# Patient Record
Sex: Female | Born: 1960 | Race: White | Hispanic: No | Marital: Single | Smoking: Current some day smoker
Health system: Southern US, Community
[De-identification: ages and names within clinical notes are randomized; demographics above are authoritative.]

## PROBLEM LIST (undated history)

## (undated) DIAGNOSIS — F32A Depression, unspecified: Secondary | ICD-10-CM

## (undated) DIAGNOSIS — F419 Anxiety disorder, unspecified: Secondary | ICD-10-CM

## (undated) DIAGNOSIS — I10 Essential (primary) hypertension: Secondary | ICD-10-CM

## (undated) DIAGNOSIS — F329 Major depressive disorder, single episode, unspecified: Secondary | ICD-10-CM

## (undated) DIAGNOSIS — J449 Chronic obstructive pulmonary disease, unspecified: Secondary | ICD-10-CM

## (undated) HISTORY — PX: APPENDECTOMY: SHX54

## (undated) HISTORY — PX: KNEE CARTILAGE SURGERY: SHX688

---

## 1998-04-08 ENCOUNTER — Emergency Department (HOSPITAL_COMMUNITY): Admission: EM | Admit: 1998-04-08 | Discharge: 1998-04-08 | Payer: Self-pay | Admitting: Emergency Medicine

## 1998-06-13 ENCOUNTER — Ambulatory Visit (HOSPITAL_COMMUNITY): Admission: RE | Admit: 1998-06-13 | Discharge: 1998-06-13 | Payer: Self-pay | Admitting: Orthopedic Surgery

## 1998-07-16 ENCOUNTER — Ambulatory Visit (HOSPITAL_COMMUNITY): Admission: RE | Admit: 1998-07-16 | Discharge: 1998-07-17 | Payer: Self-pay | Admitting: Orthopedic Surgery

## 1998-07-30 ENCOUNTER — Ambulatory Visit (HOSPITAL_COMMUNITY): Admission: RE | Admit: 1998-07-30 | Discharge: 1998-07-30 | Payer: Self-pay | Admitting: Orthopedic Surgery

## 1998-09-07 ENCOUNTER — Ambulatory Visit (HOSPITAL_COMMUNITY): Admission: RE | Admit: 1998-09-07 | Discharge: 1998-09-07 | Payer: Self-pay | Admitting: Orthopedic Surgery

## 1998-09-07 ENCOUNTER — Encounter: Payer: Self-pay | Admitting: Orthopedic Surgery

## 2000-01-27 ENCOUNTER — Other Ambulatory Visit: Admission: RE | Admit: 2000-01-27 | Discharge: 2000-01-27 | Payer: Self-pay | Admitting: Obstetrics and Gynecology

## 2007-05-08 ENCOUNTER — Emergency Department (HOSPITAL_COMMUNITY): Admission: EM | Admit: 2007-05-08 | Discharge: 2007-05-08 | Payer: Self-pay | Admitting: Emergency Medicine

## 2007-07-20 ENCOUNTER — Emergency Department (HOSPITAL_COMMUNITY): Admission: EM | Admit: 2007-07-20 | Discharge: 2007-07-21 | Payer: Self-pay | Admitting: Emergency Medicine

## 2007-08-07 ENCOUNTER — Emergency Department (HOSPITAL_COMMUNITY): Admission: EM | Admit: 2007-08-07 | Discharge: 2007-08-07 | Payer: Self-pay | Admitting: Emergency Medicine

## 2007-09-01 ENCOUNTER — Emergency Department (HOSPITAL_COMMUNITY): Admission: EM | Admit: 2007-09-01 | Discharge: 2007-09-01 | Payer: Self-pay | Admitting: Family Medicine

## 2007-09-30 ENCOUNTER — Emergency Department (HOSPITAL_COMMUNITY): Admission: EM | Admit: 2007-09-30 | Discharge: 2007-09-30 | Payer: Self-pay | Admitting: Emergency Medicine

## 2007-11-04 ENCOUNTER — Emergency Department (HOSPITAL_COMMUNITY): Admission: EM | Admit: 2007-11-04 | Discharge: 2007-11-05 | Payer: Self-pay | Admitting: Emergency Medicine

## 2008-08-12 ENCOUNTER — Emergency Department (HOSPITAL_COMMUNITY): Admission: EM | Admit: 2008-08-12 | Discharge: 2008-08-12 | Payer: Self-pay | Admitting: Emergency Medicine

## 2008-12-13 ENCOUNTER — Emergency Department (HOSPITAL_COMMUNITY): Admission: EM | Admit: 2008-12-13 | Discharge: 2008-12-13 | Payer: Self-pay | Admitting: Family Medicine

## 2009-02-07 ENCOUNTER — Emergency Department (HOSPITAL_COMMUNITY): Admission: EM | Admit: 2009-02-07 | Discharge: 2009-02-07 | Payer: Self-pay | Admitting: Emergency Medicine

## 2009-05-26 ENCOUNTER — Emergency Department (HOSPITAL_COMMUNITY): Admission: EM | Admit: 2009-05-26 | Discharge: 2009-05-26 | Payer: Self-pay | Admitting: Emergency Medicine

## 2009-06-30 ENCOUNTER — Emergency Department (HOSPITAL_COMMUNITY): Admission: EM | Admit: 2009-06-30 | Discharge: 2009-07-01 | Payer: Self-pay | Admitting: Emergency Medicine

## 2009-07-26 ENCOUNTER — Encounter: Admission: RE | Admit: 2009-07-26 | Discharge: 2009-07-26 | Payer: Self-pay | Admitting: Internal Medicine

## 2009-07-31 IMAGING — CR DG CHEST 2V
2 series · 2 of 2 positions shown · non-contrast
Comparison: none

CLINICAL DATA: Difficulty breathing.    
 CHEST - 2 VIEW:

[w chest pa *]
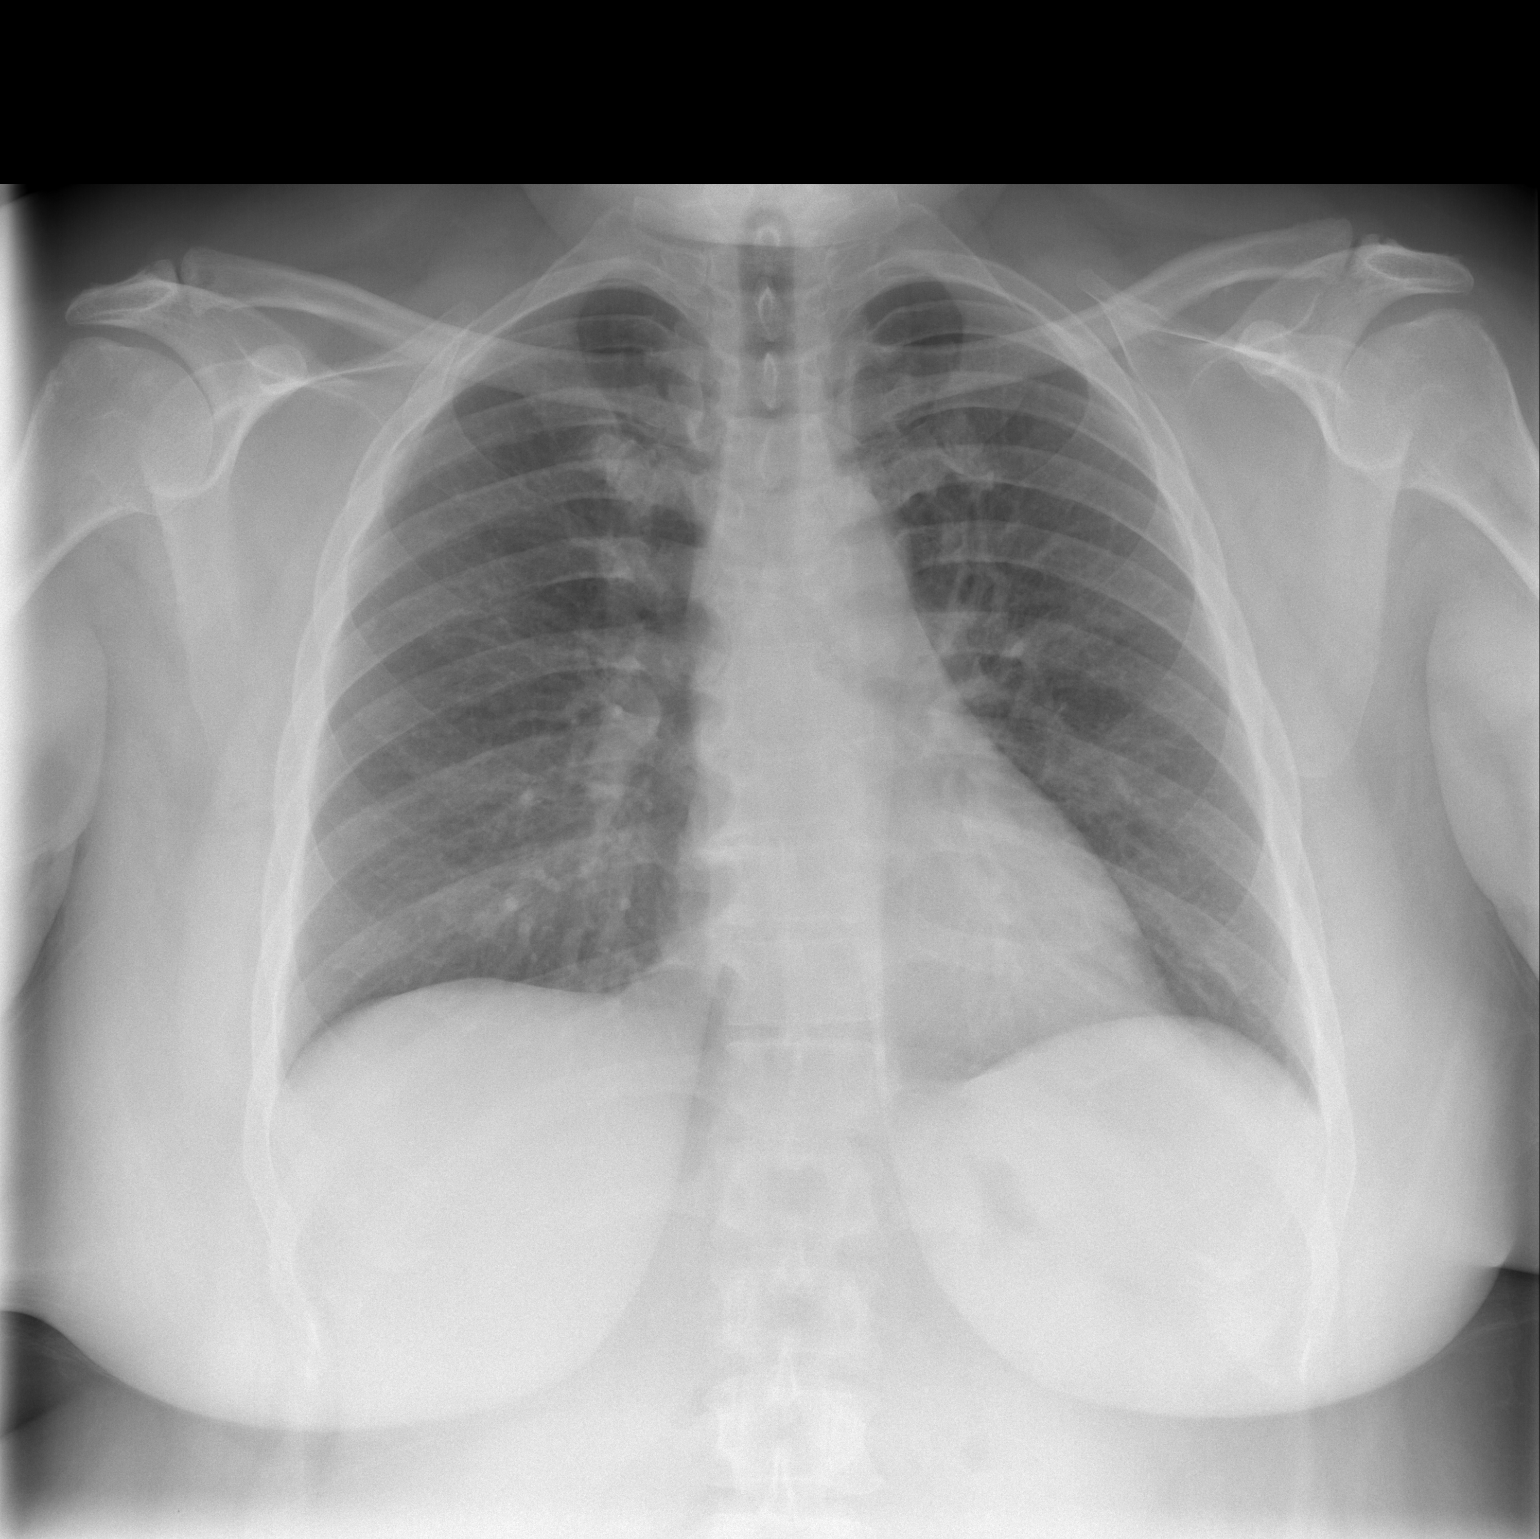

[w chest lat *]
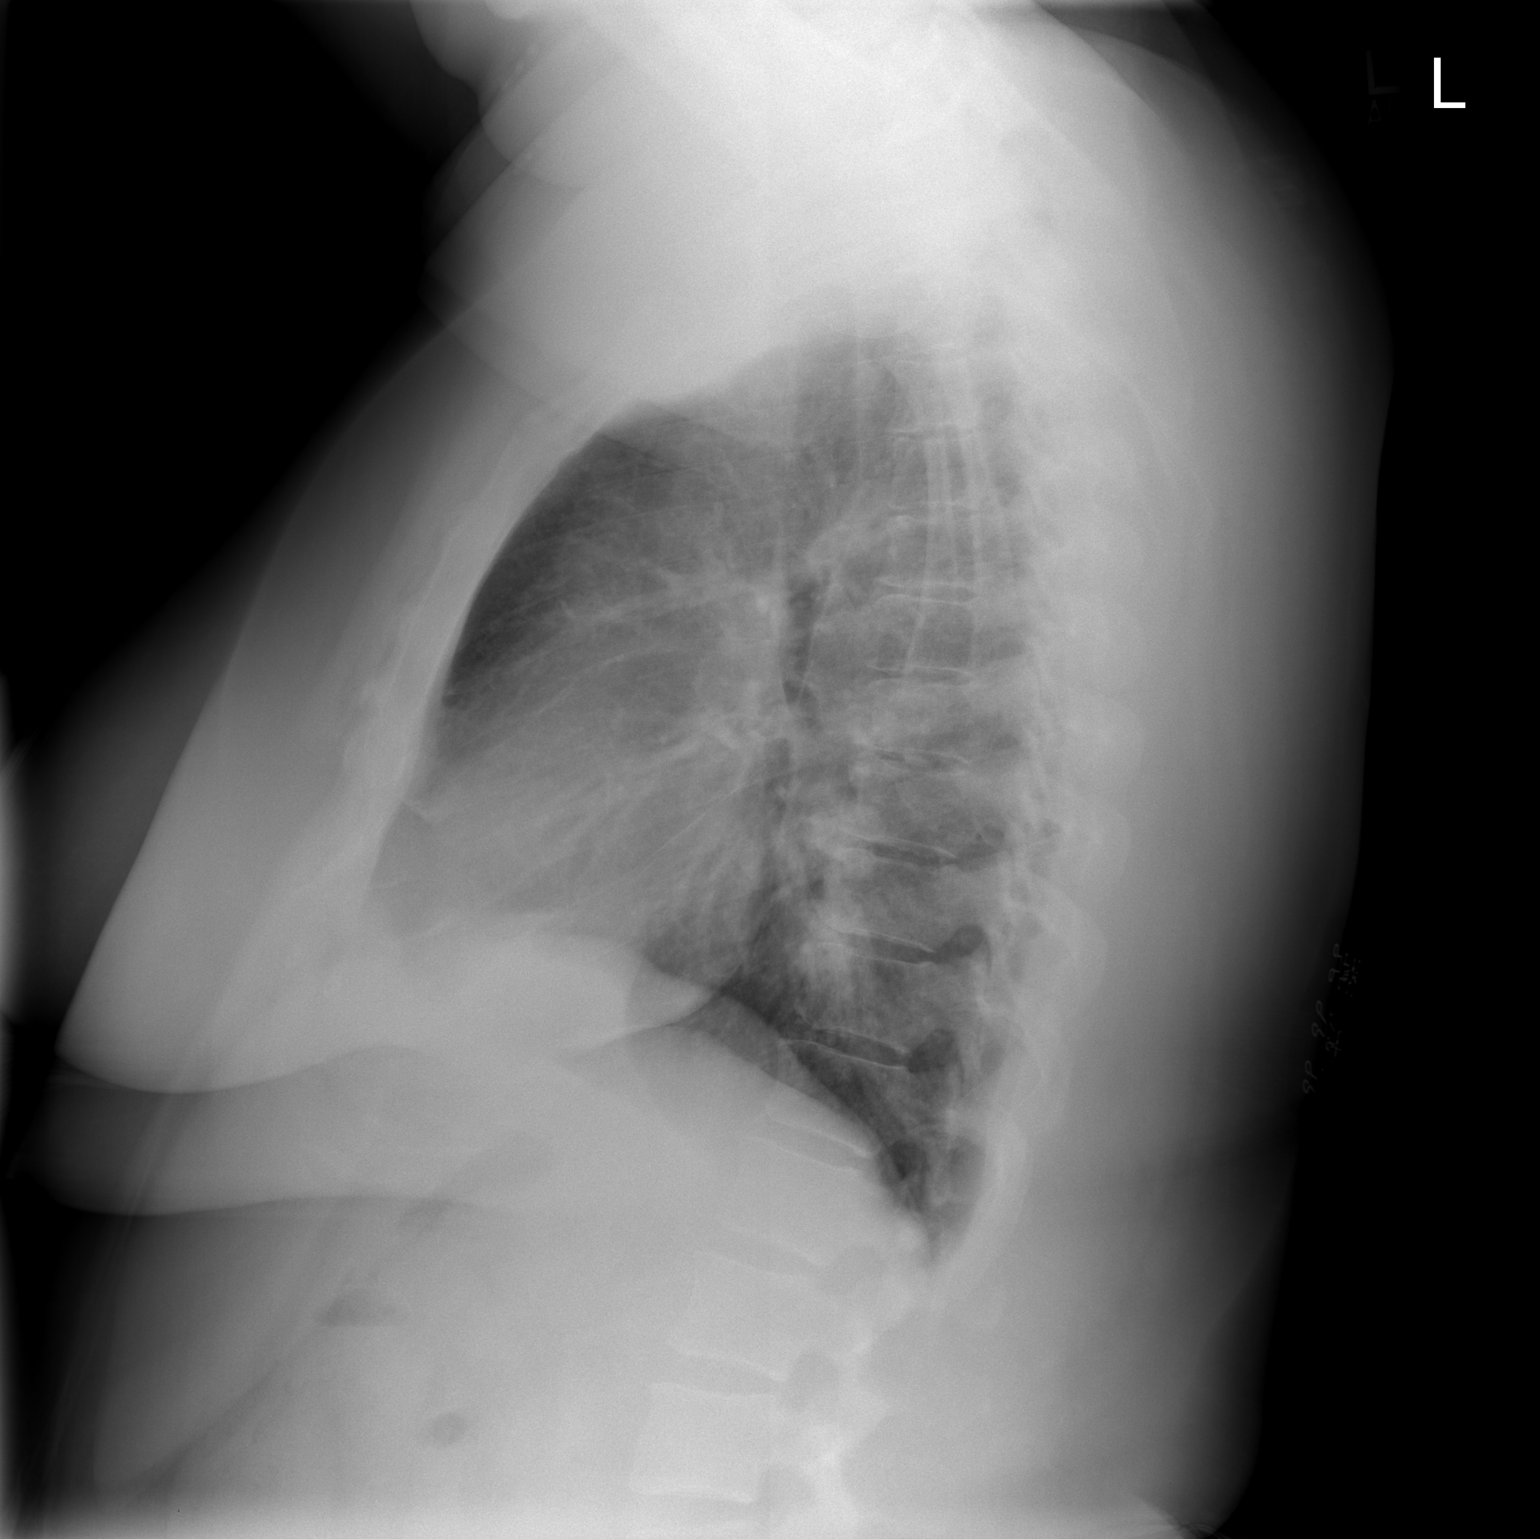

[2 of 2 positions shown; findings below may reference images not displayed]

FINDINGS: Normal mediastinum and cardiac silhouette. Costophrenic angles are clear.  Normal pulmonary vasculature.  No pneumothorax.  No focal infiltrate.
IMPRESSION: No acute cardiopulmonary disease process.

## 2009-10-27 ENCOUNTER — Emergency Department (HOSPITAL_COMMUNITY): Admission: EM | Admit: 2009-10-27 | Discharge: 2009-10-27 | Payer: Self-pay | Admitting: Emergency Medicine

## 2009-11-15 ENCOUNTER — Emergency Department (HOSPITAL_COMMUNITY): Admission: EM | Admit: 2009-11-15 | Discharge: 2009-11-15 | Payer: Self-pay | Admitting: Emergency Medicine

## 2010-01-10 ENCOUNTER — Encounter (HOSPITAL_COMMUNITY): Admission: RE | Admit: 2010-01-10 | Discharge: 2010-03-27 | Payer: Self-pay | Admitting: Interventional Cardiology

## 2010-01-18 ENCOUNTER — Emergency Department (HOSPITAL_COMMUNITY): Admission: EM | Admit: 2010-01-18 | Discharge: 2010-01-19 | Payer: Self-pay | Admitting: Emergency Medicine

## 2010-04-03 ENCOUNTER — Ambulatory Visit: Payer: Self-pay | Admitting: Surgery

## 2010-04-03 ENCOUNTER — Ambulatory Visit: Payer: Self-pay | Admitting: Cardiovascular Disease

## 2010-04-03 ENCOUNTER — Observation Stay (HOSPITAL_COMMUNITY): Admission: EM | Admit: 2010-04-03 | Discharge: 2010-04-04 | Payer: Self-pay | Admitting: Emergency Medicine

## 2010-04-03 ENCOUNTER — Encounter (INDEPENDENT_AMBULATORY_CARE_PROVIDER_SITE_OTHER): Payer: Self-pay | Admitting: Internal Medicine

## 2010-09-01 ENCOUNTER — Emergency Department (HOSPITAL_COMMUNITY): Admission: EM | Admit: 2010-09-01 | Discharge: 2010-09-01 | Payer: Self-pay | Admitting: Emergency Medicine

## 2010-11-16 ENCOUNTER — Encounter: Payer: Self-pay | Admitting: Internal Medicine

## 2010-11-17 ENCOUNTER — Encounter: Payer: Self-pay | Admitting: Interventional Cardiology

## 2011-01-12 LAB — URINALYSIS, ROUTINE W REFLEX MICROSCOPIC
Bilirubin Urine: NEGATIVE
Glucose, UA: NEGATIVE mg/dL
Hgb urine dipstick: NEGATIVE
Ketones, ur: NEGATIVE mg/dL
Urobilinogen, UA: 0.2 mg/dL (ref 0.0–1.0)

## 2011-01-12 LAB — DIFFERENTIAL
Basophils Absolute: 0.1 10*3/uL (ref 0.0–0.1)
Basophils Relative: 1 % (ref 0–1)
Eosinophils Relative: 2 % (ref 0–5)
Monocytes Relative: 5 % (ref 3–12)
Neutro Abs: 6.1 10*3/uL (ref 1.7–7.7)

## 2011-01-12 LAB — POCT CARDIAC MARKERS
CKMB, poc: 1.7 ng/mL (ref 1.0–8.0)
Myoglobin, poc: 68.1 ng/mL (ref 12–200)
Myoglobin, poc: 86.5 ng/mL (ref 12–200)
Troponin i, poc: <0.05 ng/mL (ref 0.00–0.09)

## 2011-01-12 LAB — CBC
HCT: 36.6 % (ref 36.0–46.0)
Hemoglobin: 12.6 g/dL (ref 12.0–15.0)
RDW: 13.9 % (ref 11.5–15.5)
WBC: 9.4 10*3/uL (ref 4.0–10.5)

## 2011-01-12 LAB — BASIC METABOLIC PANEL
BUN: 13 mg/dL (ref 6–23)
CO2: 29 mEq/L (ref 19–32)
Calcium: 9.1 mg/dL (ref 8.4–10.5)
Chloride: 104 mEq/L (ref 96–112)
Glucose, Bld: 104 mg/dL — ABNORMAL HIGH (ref 70–99)
Sodium: 141 mEq/L (ref 135–145)

## 2011-01-13 LAB — LIPID PANEL
Cholesterol: 189 mg/dL (ref 0–200)
Total CHOL/HDL Ratio: 5.3 RATIO
Triglycerides: 188 mg/dL — ABNORMAL HIGH (ref <150)

## 2011-01-13 LAB — POCT CARDIAC MARKERS
CKMB, poc: 1.3 ng/mL (ref 1.0–8.0)
Myoglobin, poc: 74.4 ng/mL (ref 12–200)

## 2011-01-13 LAB — CBC
HCT: 35.3 % — ABNORMAL LOW (ref 36.0–46.0)
Hemoglobin: 12 g/dL (ref 12.0–15.0)
Hemoglobin: 12.6 g/dL (ref 12.0–15.0)
MCHC: 33.5 g/dL (ref 30.0–36.0)
MCHC: 33.9 g/dL (ref 30.0–36.0)
MCV: 82.6 fL (ref 78.0–100.0)
Platelets: 268 10*3/uL (ref 150–400)
WBC: 9.2 10*3/uL (ref 4.0–10.5)

## 2011-01-13 LAB — RAPID URINE DRUG SCREEN, HOSP PERFORMED
Amphetamines: NOT DETECTED
Cocaine: NOT DETECTED
Opiates: NOT DETECTED
Tetrahydrocannabinol: NOT DETECTED

## 2011-01-13 LAB — COMPREHENSIVE METABOLIC PANEL
AST: 15 U/L (ref 0–37)
Alkaline Phosphatase: 110 U/L (ref 39–117)
BUN: 11 mg/dL (ref 6–23)
Calcium: 8.3 mg/dL — ABNORMAL LOW (ref 8.4–10.5)
Chloride: 106 mEq/L (ref 96–112)
Creatinine, Ser: 0.66 mg/dL (ref 0.4–1.2)
GFR calc Af Amer: >60 mL/min (ref >60)
Glucose, Bld: 89 mg/dL (ref 70–99)
Sodium: 137 mEq/L (ref 135–145)

## 2011-01-13 LAB — DIFFERENTIAL
Basophils Relative: 0 % (ref 0–1)
Basophils Relative: 1 % (ref 0–1)
Eosinophils Absolute: 0.1 10*3/uL (ref 0.0–0.7)
Eosinophils Absolute: 0.1 10*3/uL (ref 0.0–0.7)
Eosinophils Relative: 1 % (ref 0–5)
Lymphocytes Relative: 34 % (ref 12–46)
Lymphs Abs: 2.5 10*3/uL (ref 0.7–4.0)
Lymphs Abs: 2.8 10*3/uL (ref 0.7–4.0)
Monocytes Relative: 6 % (ref 3–12)
Neutro Abs: 4.7 10*3/uL (ref 1.7–7.7)

## 2011-01-13 LAB — BASIC METABOLIC PANEL
BUN: 9 mg/dL (ref 6–23)
CO2: 25 mEq/L (ref 19–32)
CO2: 27 mEq/L (ref 19–32)
Calcium: 8.6 mg/dL (ref 8.4–10.5)
Creatinine, Ser: 0.65 mg/dL (ref 0.4–1.2)
Creatinine, Ser: 0.66 mg/dL (ref 0.4–1.2)
GFR calc non Af Amer: >60 mL/min (ref >60)
Glucose, Bld: 94 mg/dL (ref 70–99)
Potassium: 3.6 mEq/L (ref 3.5–5.1)

## 2011-01-13 LAB — MAGNESIUM: Magnesium: 2.2 mg/dL (ref 1.5–2.5)

## 2011-01-13 LAB — URINE CULTURE
Colony Count: 6000
Special Requests: NEGATIVE

## 2011-01-13 LAB — VITAMIN B12: Vitamin B-12: 315 pg/mL (ref 211–911)

## 2011-01-13 LAB — HEMOGLOBIN A1C: Hgb A1c MFr Bld: 6.1 % — ABNORMAL HIGH (ref <5.7)

## 2011-01-13 LAB — TSH: TSH: 2.477 u[IU]/mL (ref 0.350–4.500)

## 2011-01-20 LAB — DIFFERENTIAL
Basophils Absolute: 0.1 10*3/uL (ref 0.0–0.1)
Eosinophils Relative: 1 % (ref 0–5)
Lymphocytes Relative: 31 % (ref 12–46)
Neutro Abs: 4.9 10*3/uL (ref 1.7–7.7)
Neutrophils Relative %: 61 % (ref 43–77)

## 2011-01-20 LAB — BASIC METABOLIC PANEL
BUN: 10 mg/dL (ref 6–23)
Calcium: 8.8 mg/dL (ref 8.4–10.5)
Creatinine, Ser: 0.67 mg/dL (ref 0.4–1.2)
GFR calc non Af Amer: >60 mL/min (ref >60)
Glucose, Bld: 98 mg/dL (ref 70–99)

## 2011-01-20 LAB — CBC
HCT: 37.9 % (ref 36.0–46.0)
Platelets: 265 10*3/uL (ref 150–400)
RDW: 14.7 % (ref 11.5–15.5)
WBC: 8.1 10*3/uL (ref 4.0–10.5)

## 2011-01-31 LAB — POCT CARDIAC MARKERS
Myoglobin, poc: 101 ng/mL (ref 12–200)
Troponin i, poc: <0.05 ng/mL (ref 0.00–0.09)

## 2011-02-02 LAB — POCT URINALYSIS DIP (DEVICE)
Nitrite: NEGATIVE
Protein, ur: NEGATIVE mg/dL
Specific Gravity, Urine: 1.025 (ref 1.005–1.030)
Urobilinogen, UA: 0.2 mg/dL (ref 0.0–1.0)

## 2011-02-02 LAB — WET PREP, GENITAL
Clue Cells Wet Prep HPF POC: NONE SEEN
Trich, Wet Prep: NONE SEEN

## 2011-02-02 LAB — POCT PREGNANCY, URINE: Preg Test, Ur: NEGATIVE

## 2011-02-11 LAB — GLUCOSE, CAPILLARY: Glucose-Capillary: 101 mg/dL — ABNORMAL HIGH (ref 70–99)

## 2011-03-26 ENCOUNTER — Emergency Department (HOSPITAL_COMMUNITY)
Admission: EM | Admit: 2011-03-26 | Discharge: 2011-03-26 | Disposition: A | Discharge status: Home / Self Care | Payer: Medicaid Other | Attending: Emergency Medicine | Admitting: Emergency Medicine

## 2011-03-26 ENCOUNTER — Emergency Department (HOSPITAL_COMMUNITY): Payer: Medicaid Other

## 2011-03-26 DIAGNOSIS — M7989 Other specified soft tissue disorders: Secondary | ICD-10-CM | POA: Insufficient documentation

## 2011-03-26 DIAGNOSIS — F319 Bipolar disorder, unspecified: Secondary | ICD-10-CM | POA: Insufficient documentation

## 2011-03-26 DIAGNOSIS — E669 Obesity, unspecified: Secondary | ICD-10-CM | POA: Insufficient documentation

## 2011-03-26 DIAGNOSIS — M25569 Pain in unspecified knee: Secondary | ICD-10-CM | POA: Insufficient documentation

## 2011-05-02 IMAGING — CR DG CHEST 2V
2 series · 2 of 2 positions shown · non-contrast
Comparison: 11/04/2007.

CLINICAL DATA: 47-year-old female with chest pain cough and
congestion.

CHEST - 2 VIEW

[w chest pa *]
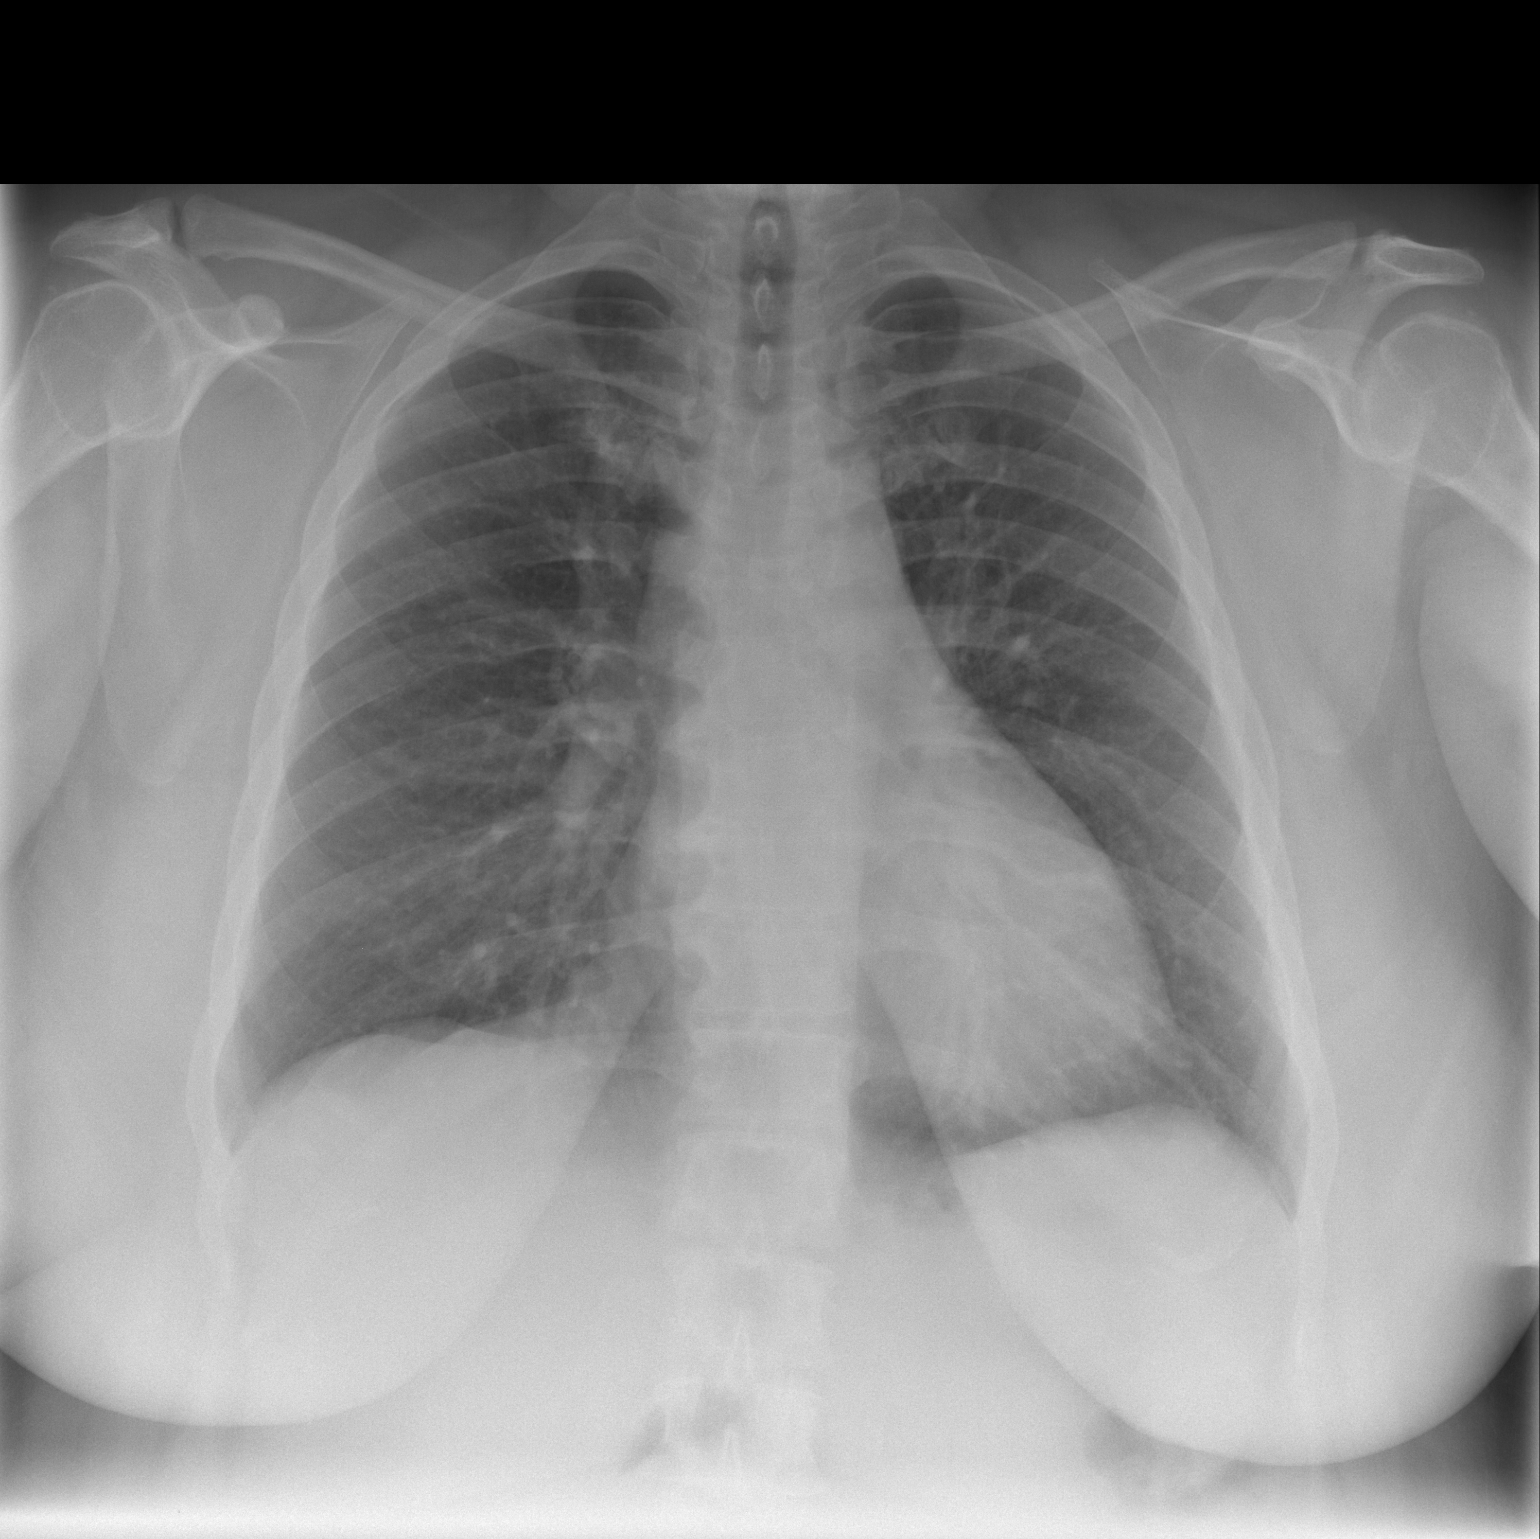

[w chest lat *]
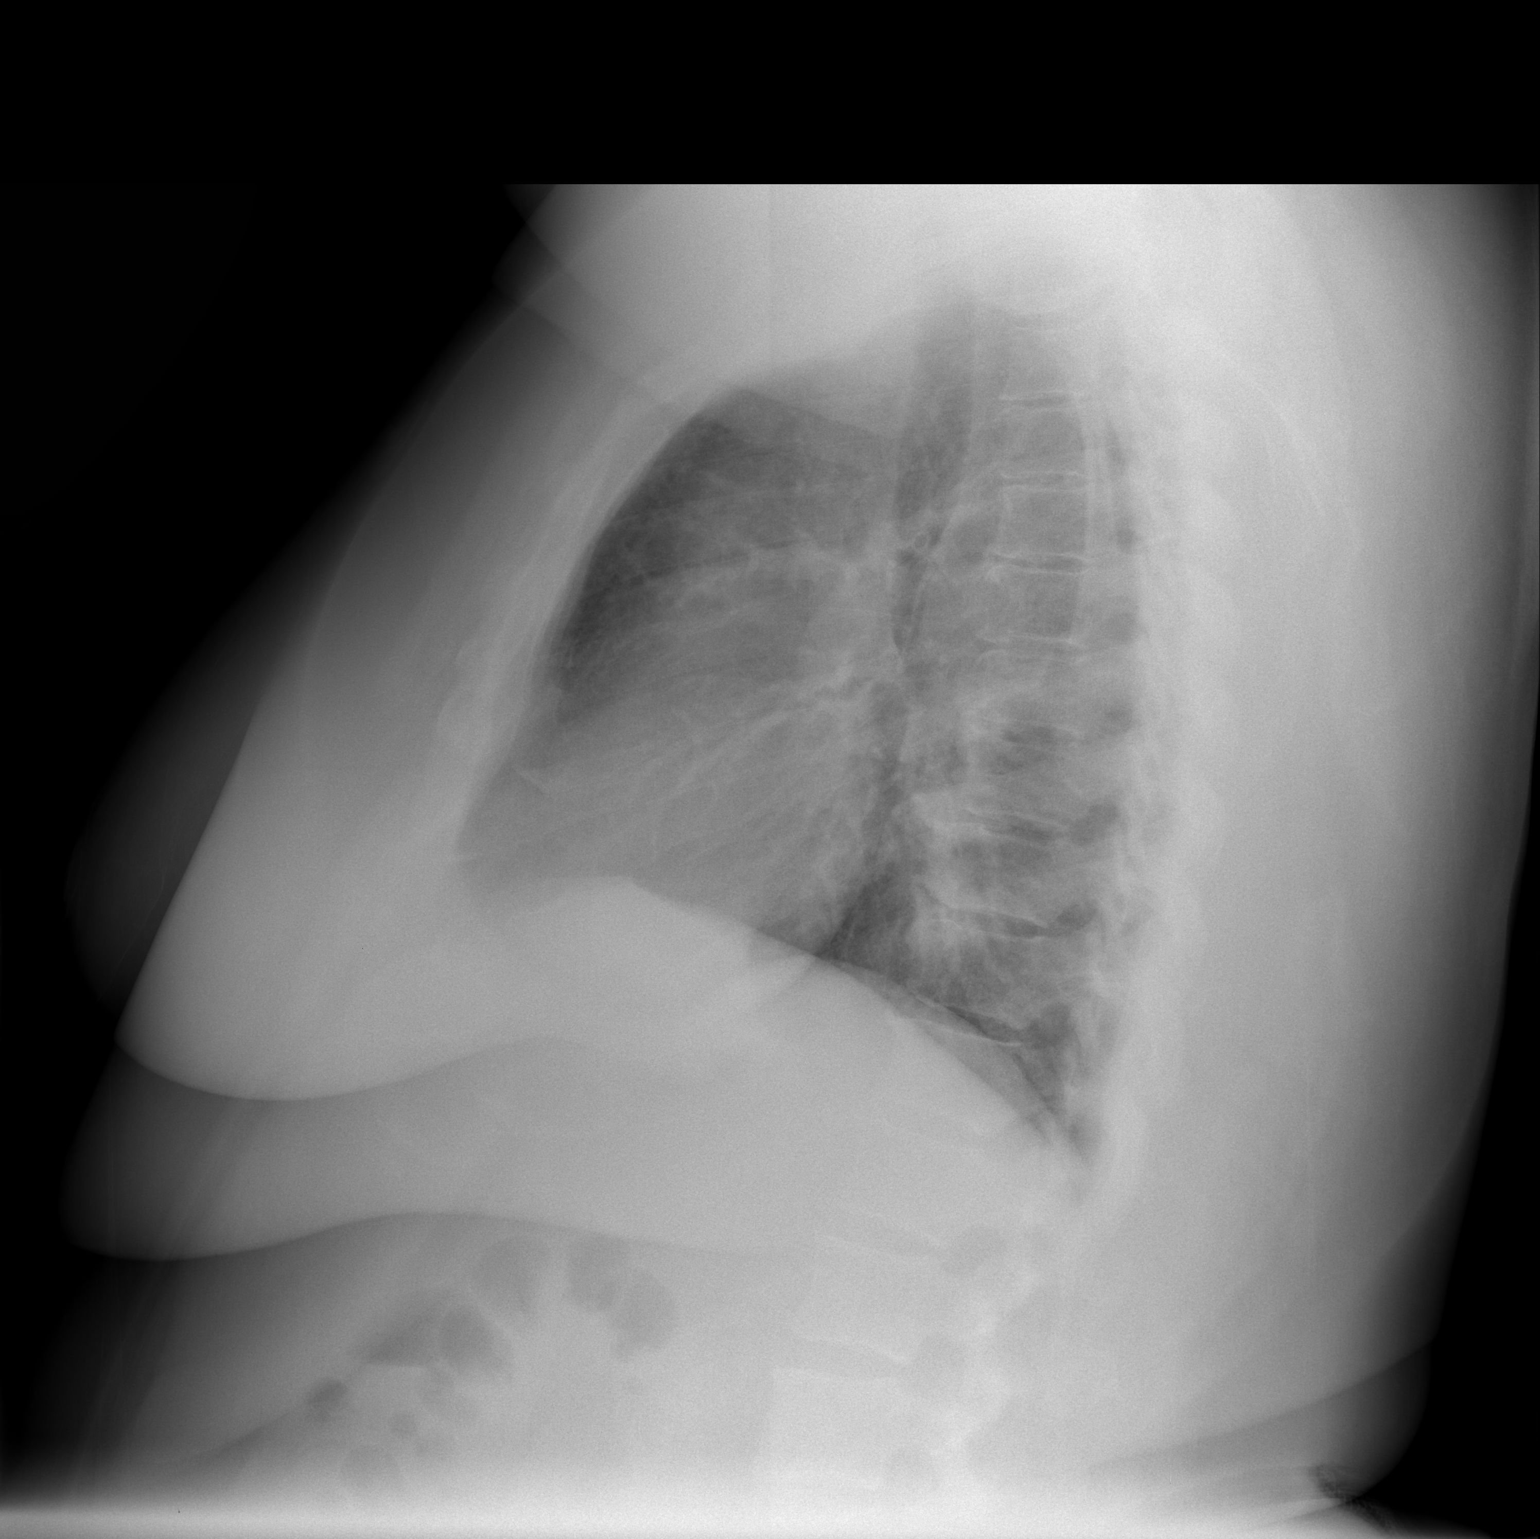

[2 of 2 positions shown; findings below may reference images not displayed]

FINDINGS: Stable lung volumes.  Stable cardiac size and mediastinal
contours.  Visualized tracheal air column is within normal limits.
No pneumothorax, pulmonary edema, pleural effusion, consolidation,
or confluent airspace opacity. No acute osseous abnormality
identified.
IMPRESSION: No acute cardiopulmonary abnormality.

## 2011-06-13 ENCOUNTER — Emergency Department (HOSPITAL_COMMUNITY)
Admission: EM | Admit: 2011-06-13 | Discharge: 2011-06-14 | Disposition: A | Discharge status: Home / Self Care | Payer: Medicaid Other | Attending: Emergency Medicine | Admitting: Emergency Medicine

## 2011-06-13 ENCOUNTER — Emergency Department (HOSPITAL_COMMUNITY): Payer: Medicaid Other

## 2011-06-13 DIAGNOSIS — J449 Chronic obstructive pulmonary disease, unspecified: Secondary | ICD-10-CM | POA: Insufficient documentation

## 2011-06-13 DIAGNOSIS — E669 Obesity, unspecified: Secondary | ICD-10-CM | POA: Insufficient documentation

## 2011-06-13 DIAGNOSIS — Z9981 Dependence on supplemental oxygen: Secondary | ICD-10-CM | POA: Insufficient documentation

## 2011-06-13 DIAGNOSIS — M7989 Other specified soft tissue disorders: Secondary | ICD-10-CM | POA: Insufficient documentation

## 2011-06-13 DIAGNOSIS — F319 Bipolar disorder, unspecified: Secondary | ICD-10-CM | POA: Insufficient documentation

## 2011-06-13 DIAGNOSIS — M79609 Pain in unspecified limb: Secondary | ICD-10-CM | POA: Insufficient documentation

## 2011-06-13 DIAGNOSIS — R0602 Shortness of breath: Secondary | ICD-10-CM | POA: Insufficient documentation

## 2011-06-13 DIAGNOSIS — J4489 Other specified chronic obstructive pulmonary disease: Secondary | ICD-10-CM | POA: Insufficient documentation

## 2011-06-13 LAB — CBC
Platelets: 257 10*3/uL (ref 150–400)
RBC: 4.31 MIL/uL (ref 3.87–5.11)
WBC: 9.8 10*3/uL (ref 4.0–10.5)

## 2011-06-13 LAB — DIFFERENTIAL
Basophils Relative: 0 % (ref 0–1)
Eosinophils Absolute: 0.2 10*3/uL (ref 0.0–0.7)
Neutrophils Relative %: 59 % (ref 43–77)

## 2011-06-14 LAB — BASIC METABOLIC PANEL
CO2: 30 mEq/L (ref 19–32)
Chloride: 103 mEq/L (ref 96–112)
Potassium: 3.7 mEq/L (ref 3.5–5.1)
Sodium: 138 mEq/L (ref 135–145)

## 2011-06-14 LAB — PRO B NATRIURETIC PEPTIDE: Pro B Natriuretic peptide (BNP): 51 pg/mL (ref 0–125)

## 2011-08-07 LAB — URINALYSIS, ROUTINE W REFLEX MICROSCOPIC
Bilirubin Urine: NEGATIVE
Nitrite: NEGATIVE
Specific Gravity, Urine: 1.028
Urobilinogen, UA: 0.2

## 2011-08-07 LAB — URINE MICROSCOPIC-ADD ON

## 2011-08-07 LAB — WET PREP, GENITAL: Clue Cells Wet Prep HPF POC: NONE SEEN

## 2011-08-07 LAB — GC/CHLAMYDIA PROBE AMP, GENITAL: Chlamydia, DNA Probe: NEGATIVE

## 2011-08-07 LAB — PREGNANCY, URINE: Preg Test, Ur: NEGATIVE

## 2012-03-10 ENCOUNTER — Encounter (HOSPITAL_COMMUNITY): Payer: Self-pay

## 2012-03-10 ENCOUNTER — Emergency Department (HOSPITAL_COMMUNITY)
Admission: EM | Admit: 2012-03-10 | Discharge: 2012-03-11 | Disposition: A | Discharge status: Home / Self Care | Payer: Self-pay | Attending: Emergency Medicine | Admitting: Emergency Medicine

## 2012-03-10 DIAGNOSIS — I872 Venous insufficiency (chronic) (peripheral): Secondary | ICD-10-CM

## 2012-03-10 HISTORY — DX: Depression, unspecified: F32.A

## 2012-03-10 HISTORY — DX: Anxiety disorder, unspecified: F41.9

## 2012-03-10 HISTORY — DX: Major depressive disorder, single episode, unspecified: F32.9

## 2012-03-10 NOTE — ED Notes (Signed)
Pt complains of bilateral feet swelling and leg pain, she states that her blood sugar was elevated tonight but she's not diabetic

## 2012-03-11 MED ORDER — HYDROCODONE-ACETAMINOPHEN 5-500 MG PO TABS: 1.0000 | ORAL_TABLET | Freq: Four times a day (QID) | ORAL | Status: AC | PRN

## 2012-03-11 MED ORDER — FUROSEMIDE 20 MG PO TABS: 20.0000 mg | ORAL_TABLET | Freq: Two times a day (BID) | ORAL | Status: DC

## 2012-03-11 MED ORDER — OXYCODONE-ACETAMINOPHEN 5-325 MG PO TABS
1.0000 | ORAL_TABLET | Freq: Once | ORAL | Status: AC
  Administered 2012-03-11: 1 via ORAL
  Filled 2012-03-11: qty 1

## 2012-03-11 MED ORDER — FUROSEMIDE 20 MG PO TABS
20.0000 mg | ORAL_TABLET | Freq: Once | ORAL | Status: AC
  Administered 2012-03-11: 20 mg via ORAL
  Filled 2012-03-11: qty 1

## 2012-03-11 NOTE — ED Notes (Signed)
Pt c/o bilateral foot and leg swelling/pain x2 weeks. Pt states pain is present whether she is resting or standing/walking. Pt states she has tried elevating her legs as well as icing and heat pads. Pt denies any cardiac or vascular hx. Pt rates pain 10/10.

## 2012-03-11 NOTE — Discharge Instructions (Signed)
Venous Stasis and Chronic Venous Insufficiency As people age, the veins located in their legs may weaken and stretch. When veins weaken and lose the ability to pump blood effectively, the condition is called chronic venous insufficiency (CVI) or venous stasis. Almost all veins return blood back to the heart. This happens by:  The force of the heart pumping fresh blood pushes blood back to the heart.   Blood flowing to the heart from the force of gravity.  In the deep veins of the legs, blood has to fight gravity and flow upstream back to the heart. Here, the leg muscles contract to pump blood back toward the heart. Vein walls are elastic, and many veins have small valves that only allow blood to flow in one direction. When leg muscles contract, they push inward against the elastic vein walls. This squeezes blood upward, opens the valves, and moves blood toward the heart. When leg muscles relax, the vein wall also relaxes and the valves inside the vein close to prevent blood from flowing backward. This method of pumping blood out of the legs is called the venous pump. CAUSES  The venous pump works best while walking and leg muscles are contracting. But when a person sits or stands, blood pressure in leg veins can build. Deep veins are usually able to withstand short periods of inactivity, but long periods of inactivity (and increased pressure) can stretch, weaken, and damage vein walls. High blood pressure can also stretch and damage vein walls. The veins may no longer be able to pump blood back to the heart. Venous hypertension (high blood pressure inside veins) that lasts over time is a primary cause of CVI. CVI can also be caused by:   Deep vein thrombosis, a condition where a thrombus (blood clot) blocks blood flow in a vein.   Phlebitis, an inflammation of a superficial vein that causes a blood clot to form.  Other risk factors for CVI may include:   Heredity.   Obesity.   Pregnancy.    Sedentary lifestyle.   Smoking.   Jobs requiring long periods of standing or sitting in one place.   Age and gender:   Women in their 40's and 50's and men in their 70's are more prone to developing CVI.  SYMPTOMS  Symptoms of CVI may include:   Varicose veins.   Ulceration or skin breakdown.   Lipodermatosclerosis, a condition that affects the skin just above the ankle, usually on the inside surface. Over time the skin becomes brown, smooth, tight and often painful. Those with this condition have a high risk of developing skin ulcers.   Reddened or discolored skin on the leg.   Swelling.  DIAGNOSIS  Your caregiver can diagnose CVI after performing a careful medical history and physical examination. To confirm the diagnosis, the following tests may also be ordered:   Duplex ultrasound.   Plethysmography (tests blood flow).   Venograms (x-ray using a special dye).  TREATMENT The goals of treatment for CVI are to restore a person to an active life and to minimize pain or disability. Typically, CVI does not pose a serious threat to life or limb, and with proper treatment most people with this condition can continue to lead active lives. In most cases, mild CVI can be treated on an outpatient basis with simple procedures. Treatment methods include:   Elastic compression socks.   Sclerotherapy, a procedure involving an injection of a material that "dissolves" the damaged veins. Other veins in the network   of blood vessels take over the function of the damaged veins.   Vein stripping (an older procedure less commonly used).   Laser Ablation surgery.   Valve repair.  HOME CARE INSTRUCTIONS   Elastic compression socks must be worn every day. They can help with symptoms and lower the chances of the problem getting worse, but they do not cure the problem.   Only take over-the-counter or prescription medicines for pain, discomfort, or fever as directed by your caregiver.    Your caregiver will review your other medications with you.  SEEK MEDICAL CARE IF:   You are confused about how to take your medications.   There is redness, swelling, or increasing pain in the affected area.   There is a red streak or line that extends up or down from the affected area.   There is a breakdown or loss of skin in the affected area, even if the breakdown is small.   You develop an unexplained oral temperature above 102 F (38.9 C).   There is an injury to the affected area.  SEEK IMMEDIATE MEDICAL CARE IF:   There is an injury and open wound to the affected area.   Pain is not adequately relieved with pain medication prescribed or becomes severe.   An oral temperature above 102 F (38.9 C) develops.   The foot/ankle below the affected area becomes suddenly numb or the area feels weak and hard to move.  MAKE SURE YOU:   Understand these instructions.   Will watch your condition.   Will get help right away if you are not doing well or get worse.  Document Released: 02/16/2007 Document Revised: 10/02/2011 Document Reviewed: 04/26/2007 [employer] Patient Information 2012 [employer]. 

## 2012-03-11 NOTE — ED Provider Notes (Signed)
History     CSN: [id number]  Arrival date & time 03/10/12  2249   First MD Initiated Contact with Patient 03/11/12 0155      Chief Complaint  Patient presents with  . Foot Pain  . Leg Swelling    (Consider location/radiation/quality/duration/timing/severity/associated sxs/prior treatment) The history is provided by the patient.   the patient reports 2 weeks of worsening lower extremity swelling which is equal bilaterally.  She has no prior history of pulmonary embolism or DVT.  She struggled with swelling in her legs before in the past and used to be on Lasix for this.  She recently has lost her insurance and no longer has a primary care physician.  She reports her symptoms are worsened over the past 2 weeks and she has a lot of aching in her lower legs when she stands on her feet for long periods of time.  She denies numbness or weakness of her legs.  She's had no fevers or chills.  She denies chest pain or shortness of breath.  She has no PND or orthopnea.  She has no exertional shortness of breath.  Her symptoms and pain at this time are mild to moderate in severity.  Nothing worsens her symptoms.  Nothing improves her symptoms.  She's tried over-the-counter pain medications without relief.  She's tried elevation of her legs but has not tried compression stockings.  She reports mild improvement with elevation.  Past Medical History  Diagnosis Date  . Depression   . Anxiety     History reviewed. No pertinent past surgical history.  History reviewed. No pertinent family history.  History  Substance Use Topics  . Smoking status: Not on file  . Smokeless tobacco: Not on file  . Alcohol Use: Yes    OB History    Grav Para Term Preterm Abortions TAB SAB Ect Mult Living                  Review of Systems  All other systems reviewed and are negative.    Allergies  Review of patient's allergies indicates no known allergies.  Home Medications   Current Outpatient Rx    Name Route Sig Dispense Refill  . GABAPENTIN 100 MG PO CAPS Oral Take 100 mg by mouth 3 (three) times daily.    . SERTRALINE HCL 100 MG PO TABS Oral Take 100 mg by mouth daily.    . FUROSEMIDE 20 MG PO TABS Oral Take 1 tablet (20 mg total) by mouth 2 (two) times daily. 10 tablet 0  . HYDROCODONE-ACETAMINOPHEN 5-500 MG PO TABS Oral Take 1-2 tablets by mouth every 6 (six) hours as needed for pain. 15 tablet 0    BP 128/72  Pulse 86  Temp(Src) 98.5 F (36.9 C) (Oral)  Resp 20  SpO2 97%  Physical Exam  Nursing note and vitals reviewed. Constitutional: She is oriented to person, place, and time. She appears well-developed and well-nourished. No distress.  HENT:  Head: Normocephalic and atraumatic.  Eyes: EOM are normal.  Neck: Normal range of motion.  Cardiovascular: Normal rate, regular rhythm and normal heart sounds.   Pulmonary/Chest: Effort normal and breath sounds normal.  Abdominal: Soft. She exhibits no distension. There is no tenderness.  Musculoskeletal:       1+ pitting edema bilaterally.  No unilateral leg swelling.  Normal PT and DP pulses distally.  No erythema tenderness or warmth noted.  Neurological: She is alert and oriented to person, place, and time.  Skin:  Skin is warm and dry.  Psychiatric: She has a normal mood and affect. Judgment normal.    ED Course  Procedures (including critical care time)  Labs Reviewed - No data to display No results found.   1. Venous insufficiency       MDM  This appears to be acute on chronic venous insufficiency.  Recommended close followup with her primary care physician.  I recommended salt reduction and compression stockings.  She'll be placed on a very short course of Lasix.  I stressed elevation as a mainstay of therapy.  My suspicion that this of bilateral DVT is very low.  I don't think she needs an ultrasound at this time.  She has no symptoms to suggest congestive heart failure.        Lyanne Co,  MD 03/11/12 [phone]

## 2013-04-13 ENCOUNTER — Emergency Department (HOSPITAL_COMMUNITY): Payer: Self-pay

## 2013-04-13 ENCOUNTER — Encounter (HOSPITAL_COMMUNITY): Payer: Self-pay | Admitting: Emergency Medicine

## 2013-04-13 ENCOUNTER — Emergency Department (HOSPITAL_COMMUNITY)
Admission: EM | Admit: 2013-04-13 | Discharge: 2013-04-13 | Disposition: A | Discharge status: Home / Self Care | Payer: Self-pay | Attending: Emergency Medicine | Admitting: Emergency Medicine

## 2013-04-13 DIAGNOSIS — IMO0001 Reserved for inherently not codable concepts without codable children: Secondary | ICD-10-CM | POA: Insufficient documentation

## 2013-04-13 DIAGNOSIS — Z79899 Other long term (current) drug therapy: Secondary | ICD-10-CM | POA: Insufficient documentation

## 2013-04-13 DIAGNOSIS — R0602 Shortness of breath: Secondary | ICD-10-CM | POA: Insufficient documentation

## 2013-04-13 DIAGNOSIS — R4789 Other speech disturbances: Secondary | ICD-10-CM | POA: Insufficient documentation

## 2013-04-13 DIAGNOSIS — E8779 Other fluid overload: Secondary | ICD-10-CM | POA: Insufficient documentation

## 2013-04-13 DIAGNOSIS — R609 Edema, unspecified: Secondary | ICD-10-CM

## 2013-04-13 DIAGNOSIS — F411 Generalized anxiety disorder: Secondary | ICD-10-CM | POA: Insufficient documentation

## 2013-04-13 DIAGNOSIS — F17200 Nicotine dependence, unspecified, uncomplicated: Secondary | ICD-10-CM | POA: Insufficient documentation

## 2013-04-13 DIAGNOSIS — J4489 Other specified chronic obstructive pulmonary disease: Secondary | ICD-10-CM | POA: Insufficient documentation

## 2013-04-13 DIAGNOSIS — M2550 Pain in unspecified joint: Secondary | ICD-10-CM | POA: Insufficient documentation

## 2013-04-13 DIAGNOSIS — J449 Chronic obstructive pulmonary disease, unspecified: Secondary | ICD-10-CM | POA: Insufficient documentation

## 2013-04-13 DIAGNOSIS — F3289 Other specified depressive episodes: Secondary | ICD-10-CM | POA: Insufficient documentation

## 2013-04-13 DIAGNOSIS — F329 Major depressive disorder, single episode, unspecified: Secondary | ICD-10-CM | POA: Insufficient documentation

## 2013-04-13 DIAGNOSIS — M7989 Other specified soft tissue disorders: Secondary | ICD-10-CM | POA: Insufficient documentation

## 2013-04-13 HISTORY — DX: Chronic obstructive pulmonary disease, unspecified: J44.9

## 2013-04-13 LAB — BASIC METABOLIC PANEL
BUN: 10 mg/dL (ref 6–23)
Chloride: 101 mEq/L (ref 96–112)
Creatinine, Ser: 0.77 mg/dL (ref 0.50–1.10)
GFR calc Af Amer: >90 mL/min (ref >90)
Glucose, Bld: 97 mg/dL (ref 70–99)
Potassium: 3.9 mEq/L (ref 3.5–5.1)

## 2013-04-13 LAB — CBC
HCT: 37 % (ref 36.0–46.0)
Hemoglobin: 12.1 g/dL (ref 12.0–15.0)
MCH: 26.7 pg (ref 26.0–34.0)
MCHC: 32.7 g/dL (ref 30.0–36.0)
MCV: 81.7 fL (ref 78.0–100.0)
RDW: 14.5 % (ref 11.5–15.5)

## 2013-04-13 MED ORDER — IPRATROPIUM BROMIDE 0.02 % IN SOLN
0.5000 mg | Freq: Once | RESPIRATORY_TRACT | Status: AC
  Administered 2013-04-13: 0.5 mg via RESPIRATORY_TRACT
  Filled 2013-04-13: qty 2.5

## 2013-04-13 MED ORDER — ALBUTEROL SULFATE (5 MG/ML) 0.5% IN NEBU
5.0000 mg | INHALATION_SOLUTION | Freq: Once | RESPIRATORY_TRACT | Status: AC
  Administered 2013-04-13: 5 mg via RESPIRATORY_TRACT
  Filled 2013-04-13: qty 1

## 2013-04-13 MED ORDER — AMLODIPINE BESYLATE 10 MG PO TABS: 5.0000 mg | ORAL_TABLET | Freq: Every day | ORAL | Status: DC

## 2013-04-13 MED ORDER — FUROSEMIDE 40 MG PO TABS: 40.0000 mg | ORAL_TABLET | Freq: Every day | ORAL | Status: DC

## 2013-04-13 NOTE — ED Provider Notes (Signed)
1:11 PM  Date: 04/13/2013  Rate:74  Rhythm: normal sinus rhythm  QRS Axis: right  Intervals: normal QRS:  Low voltage  ST/T Wave abnormalities: normal  Conduction Disutrbances:none  Narrative Interpretation: Borderline EKG  Old EKG Reviewed: none available      Carleene Cooper III, MD 04/13/13 1313

## 2013-04-13 NOTE — ED Notes (Signed)
Per EMS - pt c/o sob since yesterday. Left lung sounds like rhonchi. Pt reports she thinks she is talking different. Pt's mother confirms and says her speech is different. No other neuro deficits. Pt c/o tongue pain, unsure why her tongue hurts. Pt reports with walking she feels out of breath. Pt has hx of COPD, doesn't wear oxygen at home, used to wear it but her medicaid was dropped. Pt was 97% on room air, BP 115/85 HR 80, RR 14.

## 2013-04-13 NOTE — ED Provider Notes (Signed)
History     CSN: [id number]  Arrival date & time 04/13/13  1236   First MD Initiated Contact with Patient 04/13/13 1304      Chief Complaint  Patient presents with  . Shortness of Breath    (Consider location/radiation/quality/duration/timing/severity/associated sxs/prior treatment) HPI Comments: 52 year old female who presents today with 2 days of shortness of breath, work finding difficulty, drooling, sensation of a swollen tongue. Her mother is in the room and confirms the sudden onset of word finding difficulty. Patient states that she thinks she is saying the words correctly, but her mother cannot understand what she is saying. No associated headaches, weakness, numbness. She states her symptoms are gradually getting worse. Her legs have had worsening swelling in the past year. She has a history of COPD and used to be on home oxygen. She lost her insurance and could not afford it so she has not had any medication in years. Denies any history of CHF or ever having taken Lasix. No fevers, chills, cough, nausea, vomiting, abdominal pian, weakness, numbness, paresthesias.  The history is provided by the patient. No language interpreter was used.    Past Medical History  Diagnosis Date  . Depression   . Anxiety   . COPD (chronic obstructive pulmonary disease)     Past Surgical History  Procedure Laterality Date  . Knee cartilage surgery Left   . Cesarean section  1993    No family history on file.  History  Substance Use Topics  . Smoking status: Current Every Day Smoker -- 0.50 packs/day    Types: Cigarettes  . Smokeless tobacco: Not on file  . Alcohol Use: Yes    OB History   Grav Para Term Preterm Abortions TAB SAB Ect Mult Living                  Review of Systems  Constitutional: Negative for fever and chills.  Respiratory: Positive for shortness of breath.   Cardiovascular: Positive for leg swelling. Negative for chest pain.  Gastrointestinal: Negative for  vomiting and abdominal pain.  Musculoskeletal: Positive for myalgias and arthralgias.  Neurological: Positive for speech difficulty.    Allergies  Review of patient's allergies indicates no known allergies.  Home Medications   Current Outpatient Rx  Name  Route  Sig  Dispense  Refill  . diphenhydrAMINE (BENADRYL) 25 MG tablet   Oral   Take 25 mg by mouth every 6 (six) hours as needed for itching or allergies.         Marland Kitchen sertraline (ZOLOFT) 100 MG tablet   Oral   Take 100 mg by mouth daily.         . traZODone (DESYREL) 100 MG tablet   Oral   Take 50-100 mg by mouth at bedtime as needed for sleep.           BP 162/148  Temp(Src) 98.6 F (37 C) (Oral)  Resp 13  SpO2 96%  Physical Exam  Nursing note and vitals reviewed. Constitutional: She is oriented to person, place, and time. She appears well-developed and well-nourished. She does not appear ill. No distress.  Obese Sitting comfortably in bed  HENT:  Head: Normocephalic and atraumatic.  Right Ear: External ear normal.  Left Ear: External ear normal.  Nose: Nose normal.  Mouth/Throat: Oropharynx is clear and moist.  No tongue swelling  Eyes: Conjunctivae are normal.  Neck: Normal range of motion.  Cardiovascular: Normal rate, regular rhythm and normal heart sounds.   Bilateral  nonpitting edema in lower legs; right worse than left Erythema on right lower leg from edema  Pulmonary/Chest: Effort normal. No stridor. No respiratory distress. She has no wheezes. She has rhonchi (diffusely). She has no rales.  Abdominal: Soft. She exhibits no distension. There is no tenderness.  Musculoskeletal: Normal range of motion.  Neurological: She is alert and oriented to person, place, and time. She has normal strength. No cranial nerve deficit (III-XII) or sensory deficit. She exhibits normal muscle tone. Coordination and gait normal.  Skin: Skin is warm and dry. She is not diaphoretic. No erythema.  Psychiatric: She has a  normal mood and affect. Her behavior is normal.    ED Course  Procedures (including critical care time)  Labs Reviewed  BASIC METABOLIC PANEL  CBC  PRO B NATRIURETIC PEPTIDE  POCT I-STAT TROPONIN I   Dg Chest 2 View (if Patient Has Fever And/or Copd)  04/13/2013   *RADIOLOGY REPORT*  Clinical Data: Short of breath and slurred speech  CHEST - 2 VIEW  Comparison: 06/13/2011  Findings: Heart size upper normal and unchanged.  Negative for heart failure.  The lungs are clear of infiltrate or effusion. Negative for mass or adenopathy.  IMPRESSION: No acute abnormality.   Original Report Authenticated By: Janeece Riggers, M.D.   Ct Head Wo Contrast  04/13/2013   *RADIOLOGY REPORT*  Clinical Data:  Word finding problem.  Speech problems  CT HEAD WITHOUT CONTRAST  Technique:  Contiguous axial images were obtained from the base of the skull through the vertex without contrast  Comparison:  04/02/2010  Findings:  The brain has a normal appearance without evidence for hemorrhage, acute infarction, hydrocephalus, or mass lesion.  There is no extra axial fluid collection.  The skull and paranasal sinuses are normal.  IMPRESSION: Normal CT of the head without contrast.   Original Report Authenticated By: Janeece Riggers, M.D.   Date: 04/13/2013  Rate:74  Rhythm: normal sinus rhythm  QRS Axis: right  Intervals: normal  QRS: Low voltage  ST/T Wave abnormalities: normal  Conduction Disutrbances:none  Narrative Interpretation: Old EKG Reviewed: none available   1. Shortness of breath   2. Fluid retention       MDM  Patient is an obese 52 year old female who presents with worsening shortness of breath and peripheral edema. CXR shows no acute abnormality. Labs all WNL. Patient also with word finding difficulty. On exam she is coherent. Head CT normal. Patient dc'd with lasix and norvasc. You must follow up with a pcp. Name and number of health and wellness clinic given. Dr. Ignacia Palma evaluated patient and  agrees with plan. Return instructions given. Vital signs stable for discharge. Patient / Family / Caregiver informed of clinical course, understand medical decision-making process, and agree with plan.         Mora Bellman, PA-C 04/14/13 1435  Mora Bellman, PA-C 04/14/13 1435

## 2013-04-13 NOTE — ED Notes (Signed)
Pt reports she has never been told she has HTN but can't remember the last time she had it checked.

## 2013-04-13 NOTE — ED Notes (Signed)
Pt returned from radiology, placed back on monitor.  

## 2013-04-13 NOTE — ED Notes (Signed)
Pt c/o sore throat that started on Sunday, on Monday when she woke up she could hardly speak right, pt reports she didn't have a voice. Pt reports that yesterday she became more sob than normally, especially with walking. Pt able to speak in full sentences, pt reports she hasn't been able to taste anything since Monday. Pt in nad, skin warm and dry, resp e/u. No facial droop, equal hand grips, ambulatory with no issues.

## 2013-04-13 NOTE — ED Notes (Signed)
Pt finished neb. treatment. 

## 2013-04-15 NOTE — ED Provider Notes (Signed)
Medical screening examination/treatment/procedure(s) were performed by non-physician practitioner and as supervising physician I was immediately available for consultation/collaboration.   Carleene Cooper III, MD 04/15/13 [phone]

## 2014-04-14 ENCOUNTER — Emergency Department (HOSPITAL_COMMUNITY): Payer: Medicaid Other

## 2014-04-14 ENCOUNTER — Encounter (HOSPITAL_COMMUNITY): Payer: Self-pay | Admitting: Emergency Medicine

## 2014-04-14 ENCOUNTER — Emergency Department (HOSPITAL_COMMUNITY)
Admission: EM | Admit: 2014-04-14 | Discharge: 2014-04-14 | Disposition: A | Discharge status: Home / Self Care | Payer: Medicaid Other | Attending: Emergency Medicine | Admitting: Emergency Medicine

## 2014-04-14 DIAGNOSIS — J4489 Other specified chronic obstructive pulmonary disease: Secondary | ICD-10-CM | POA: Insufficient documentation

## 2014-04-14 DIAGNOSIS — F17200 Nicotine dependence, unspecified, uncomplicated: Secondary | ICD-10-CM | POA: Diagnosis not present

## 2014-04-14 DIAGNOSIS — F329 Major depressive disorder, single episode, unspecified: Secondary | ICD-10-CM | POA: Diagnosis not present

## 2014-04-14 DIAGNOSIS — Z79899 Other long term (current) drug therapy: Secondary | ICD-10-CM | POA: Insufficient documentation

## 2014-04-14 DIAGNOSIS — F411 Generalized anxiety disorder: Secondary | ICD-10-CM | POA: Insufficient documentation

## 2014-04-14 DIAGNOSIS — Z9889 Other specified postprocedural states: Secondary | ICD-10-CM | POA: Insufficient documentation

## 2014-04-14 DIAGNOSIS — R609 Edema, unspecified: Secondary | ICD-10-CM | POA: Diagnosis not present

## 2014-04-14 DIAGNOSIS — I10 Essential (primary) hypertension: Secondary | ICD-10-CM | POA: Diagnosis not present

## 2014-04-14 DIAGNOSIS — J449 Chronic obstructive pulmonary disease, unspecified: Secondary | ICD-10-CM | POA: Insufficient documentation

## 2014-04-14 DIAGNOSIS — M7989 Other specified soft tissue disorders: Secondary | ICD-10-CM | POA: Diagnosis present

## 2014-04-14 DIAGNOSIS — R0602 Shortness of breath: Secondary | ICD-10-CM | POA: Insufficient documentation

## 2014-04-14 DIAGNOSIS — F3289 Other specified depressive episodes: Secondary | ICD-10-CM | POA: Diagnosis not present

## 2014-04-14 HISTORY — DX: Essential (primary) hypertension: I10

## 2014-04-14 LAB — I-STAT CHEM 8, ED
BUN: 10 mg/dL (ref 6–23)
Calcium, Ion: 1.19 mmol/L (ref 1.12–1.23)
Chloride: 102 mEq/L (ref 96–112)
Creatinine, Ser: 0.8 mg/dL (ref 0.50–1.10)
Glucose, Bld: 107 mg/dL — ABNORMAL HIGH (ref 70–99)
HCT: 39 % (ref 36.0–46.0)
HEMOGLOBIN: 13.3 g/dL (ref 12.0–15.0)
Potassium: 4.4 mEq/L (ref 3.7–5.3)
SODIUM: 140 meq/L (ref 137–147)
TCO2: 30 mmol/L (ref 0–100)

## 2014-04-14 LAB — PRO B NATRIURETIC PEPTIDE: Pro B Natriuretic peptide (BNP): 24 pg/mL (ref 0–125)

## 2014-04-14 LAB — I-STAT TROPONIN, ED: TROPONIN I, POC: 0 ng/mL (ref 0.00–0.08)

## 2014-04-14 MED ORDER — FUROSEMIDE 40 MG PO TABS: 40.0000 mg | ORAL_TABLET | Freq: Every day | ORAL | Status: DC

## 2014-04-14 NOTE — Discharge Instructions (Signed)
Peripheral Edema °You have swelling in your legs (peripheral edema). This swelling is due to excess accumulation of salt and water in your body. Edema may be a sign of heart, kidney or liver disease, or a side effect of a medication. It may also be due to problems in the leg veins. Elevating your legs and using special support stockings may be very helpful, if the cause of the swelling is due to poor venous circulation. Avoid long periods of standing, whatever the cause. °Treatment of edema depends on identifying the cause. Chips, pretzels, pickles and other salty foods should be avoided. Restricting salt in your diet is almost always needed. Water pills (diuretics) are often used to remove the excess salt and water from your body via urine. These medicines prevent the kidney from reabsorbing sodium. This increases urine flow. °Diuretic treatment may also result in lowering of potassium levels in your body. Potassium supplements may be needed if you have to use diuretics daily. Daily weights can help you keep track of your progress in clearing your edema. You should call your caregiver for follow up care as recommended. °SEEK IMMEDIATE MEDICAL CARE IF:  °· You have increased swelling, pain, redness, or heat in your legs. °· You develop shortness of breath, especially when lying down. °· You develop chest or abdominal pain, weakness, or fainting. °· You have a fever. °Document Released: 11/20/2004 Document Revised: 01/05/2012 Document Reviewed: 10/31/2009 °ExitCare® Patient Information ©2015 [employer]. This information is not intended to replace advice given to you by your health care provider. Make sure you discuss any questions you have with your health care provider. ° ° °Emergency Department Resource Guide °1) Find a Doctor and Pay Out of Pocket °Although you won't have to find out who is covered by your insurance plan, it is a good idea to ask around and get recommendations. You will then need to call the  office and see if the doctor you have chosen will accept you as a new patient and what types of options they offer for patients who are self-pay. Some doctors offer discounts or will set up payment plans for their patients who do not have insurance, but you will need to ask so you aren't surprised when you get to your appointment. ° °2) Contact Your Local Health Department °Not all health departments have doctors that can see patients for sick visits, but many do, so it is worth a call to see if yours does. If you don't know where your local health department is, you can check in your phone book. The CDC also has a tool to help you locate your state's health department, and many state websites also have listings of all of their local health departments. ° °3) Find a Walk-in Clinic °If your illness is not likely to be very severe or complicated, you may want to try a walk in clinic. These are popping up all over the country in pharmacies, drugstores, and shopping centers. They're usually staffed by nurse practitioners or physician assistants that have been trained to treat common illnesses and complaints. They're usually fairly quick and inexpensive. However, if you have serious medical issues or chronic medical problems, these are probably not your best option. ° °No Primary Care Doctor: °- Call [facility] at  832-8000 - they can help you locate a primary care doctor that  accepts your insurance, provides certain services, etc. °- Physician Referral Service- 1-800-533-3463 ° °Chronic Pain Problems: °Organization           Address  Phone   Notes  °[facility]  (336) 297-2271 Patients need to be referred by their primary care doctor.  ° °Medication Assistance: °Organization         Address  Phone   Notes  °[place] [facility] [address] °[place], [place] [address] (336) 641-8030 --Must be a resident of [place] °-- Must have NO insurance coverage  whatsoever (no Medicaid/ Medicare, etc.) °-- The pt. MUST have a primary care doctor that directs their care regularly and follows them in the community °  °[facility]  (866) 331-1348   °[employer]  (888) 892-1162   ° °Agencies that provide inexpensive medical care: °Organization         Address  Phone   Notes  °[facility]  (336) 832-8035   °[facility]    (336) 832-7272   °[facility] [address] °[place], [place] [address] (336) 832-4777   °[facility] [address], °[place] (336) 271-4999   °[facility]    (336) 373-0678   °[facility]    (336) 272-1050   °[facility] ° [address], [place] Phone:  (336) 832-4444, Fax:  (336) 832-4440 Hours of Operation:  9 am - 6 pm, M-F.  Also accepts Medicaid/Medicare and self-pay.  °[facility] ° [address], [place] Phone: (336) 832-3150, Fax: (336) 832-3151. Hours of Operation:  8:30 am - 5:30 pm, M-F.  Also accepts Medicaid and self-pay.  °[facility] [place] [address], [place] Phone: (336) 878-6027   °[facility] [address], [place], [place] (336)723-1848, Ext. 123 Mondays & Thursdays: 7-9 AM.  First 15 patients are seen on a first come, first serve basis. °  ° °Medicaid-accepting [place] Providers: ° °Organization         Address  Phone   Notes  °[facility] [address], [place] (336) 641-2100 Also accepts self-pay patients.  °[facility] [address], [place] ° (336) 856-9996   °[facility] [address], [place] (336) 288-8857   °[facility] [address] [place] [address], [place] (336) 299-7000   °Veita Bland [address], [place]  ° (336) 373-1557 Only accepts [place] Access Medicaid patients after they have their name applied  to their card.  ° °Self-Pay (no insurance) in [place]: ° °Organization         Address  Phone   Notes  °Sickle Cell Patients, [facility] [address], [place] (336) 832-1970   °[facility] [address], [place] (336) 832-4400   °[facility] ° [address], [place] (336) 992-4800   °[facility]/Dr. Osei-Bonsu ° [address] [place] [address], [place] or [address], [place] (336) 841-8500 Phone number for both [place] and [place] locations is the same.  °[facility] [address], [place] (336) 299-0000   °[facility] [address] [place] [address], [place] or [address] (336) 852-7530 °(336) 878-2260   °[facility] [address], [place] (336) 350-1642, phone; (336) 294-5005, fax Sees patients 1st and 3rd Saturday of every month.  Must not qualify   for public or private insurance (i.e. Medicaid, Medicare, [place] Health Choice, Veterans' Benefits) • Household income should be no more than 200% of the poverty level •The clinic cannot treat you if you are pregnant or think you are pregnant • Sexually transmitted diseases are not treated at the clinic.  ° ° °Dental Care: °Organization         Address  Phone  Notes  °[facility] [address], [place] (336) 641-6152 Accepts children up to age 21 who are enrolled in Medicaid or [place] Health Choice; pregnant women with a Medicaid card; and children who have applied for Medicaid or [place] Health Choice, but were declined, whose parents can pay a reduced fee at time of service.  °[facility] [place]  [address], [place] (336) 641-7733 Accepts children up to age 21 who are enrolled in Medicaid or [place] Health Choice; pregnant women with a Medicaid card; and children who have applied for Medicaid or [place]  Health Choice, but were declined, whose parents can pay a reduced fee at time of service.  °[facility] ° [address], [place] (336) 641-4533 Patients are seen by appointment only. Walk-ins are not accepted. [facility] will see patients 18 years of age and older. °Monday - Tuesday (8am-5pm) °Most Wednesdays (8:30-5pm) °$30 per visit, cash only  °[facility] ° [address], [place] (336) 641-4533 Patients are seen by appointment only. Walk-ins are not accepted. [facility] will see patients 18 years of age and older. °One Wednesday Evening (Monthly: Volunteer Based).  $30 per visit, cash only  °[facility]  (919) 537-3737 for adults; Children under age 4, call Graduate Pediatric Dentistry at (919) [phone]. Children aged 4-14, please call (919) [phone] to request a pediatric application. ° Dental services are provided in all areas of dental care including fillings, crowns and bridges, complete and partial dentures, implants, gum treatment, root canals, and extractions. Preventive care is also provided. Treatment is provided to both adults and children. °Patients are selected via a lottery and there is often a waiting list. °  °[facility] [address], °[place] ° (336) 763-8833 [email or web address] °  °[facility] [address], [place], [place] (336)723-1848, Ext. 123 Second and Fourth Thursday of each month, opens at 6:30 AM; Clinic ends at 9 AM.  Patients are seen on a first-come first-served basis, and a limited number are seen during each clinic.  ° °[facility] ° [address], [place], [place] (336) 723-7904   Eligibility Requirements °You must have lived in [place], [place], or [place] counties for at least the last three months. °  You cannot be eligible for state or federal sponsored healthcare insurance, including Veterans Administration, Medicaid, or Medicare. °   You generally cannot be eligible for healthcare insurance through your employer.  °  How to apply: °Eligibility screenings are held every Tuesday and Wednesday afternoon from 1:00 pm until 4:00 pm. You do not need an appointment for the interview!  °[facility] [address], [place], [place] 336-631-2330   °[facility]  336-342-8273   °[facility]  336-703-3100   °[facility]  336-570-6415   ° °Behavioral Health Resources in the Community: °Intensive Outpatient Programs °Organization         Address  Phone    Notes  °[facility] [address], [place], [place] 336-878-6098   °[facility] [address], [place], [place] 336-832-9800   °[facility] [address], [place], [place] ° 336-882-2125   °[facility] [address],  °[place], [place] 1-800-853-5163 or 336-641-4981   °Substance Abuse Resources °Organization         Address  Phone  Notes  °[facility]  336-882-2125   °[facility]  336-784-9470   °[facility]  336-285-9073   °[facility]  336-845-3988   °Residential & Outpatient Substance Abuse Program  1-800-659-3381   °Psychological Services °Organization         Address  Phone  Notes  °[facility]  336- 832-9600   °[facility]  336- 378-7881   °[facility] [address], [place] 1-800-853-5163 or 336-641-4981   ° °Mobile Crisis Teams °Organization         Address  Phone  Notes  °[facility], Mobile Crisis Care Unit  1-877-626-1772   °Assertive °[facility] ° [address], [place] 336-834-9664   °Sharon DeEsch [address] °[place] 336-554-5454   ° °Self-Help/Support Groups °Organization         Address  Phone             Notes  °[facility] - variety of support groups  336- 373-1402 Call  for more information  °Narcotics Anonymous (NA), [facility] [address], °[place]  2 meetings at this location  ° °Residential Treatment Programs °Organization         Address  Phone  Notes  °[facility] [address],    °[place]  1-866-801-8205   °[facility] ° [address], [place], [place] 704-293-8524   °[facility] [address], [place] 336-845-3988 Admissions: 8am-3pm M-F  °[facility] [address]    °[place], [place] 336-841-1104   °[facility] [address], [place], [place] 336-379-7146   °[facility] [address]  °[place], [place] 336-285-9073   °[facility] Intensive Outpatient [address], [place], [place] 336-852-3033   °[facility] [address]  °[place], [place] 1-877-615-2722 or 336-784-9470   °[facility] [address] [place], [place] 336-227-7417 Accepts Medicaid  °[facility] [address]  ° [place] 1-800-659-3381 Substance Abuse/Addiction Treatment  ° °[place] Behavioral Health Resources °Organization         Address  Phone  Notes  °[facility]  (888) 581-9988   °Kaisha Wachob Brannon, PhD [address] [place], [place]   (336) 349-5553 or (336) 951-0000   °[facility]   [address] °[place], [place] (336) 349-4454   °[facility] [address], [place], [place] (336) 342-8316 Insurance/Medicaid/sponsorship through [facility]  °[facility] [address] [place]                                    [place], [place] (336) 342-8316 Therapy/tele-psych/case  °[facility] [address]  ° [place], [place] (336) 349-2233    °Dr. Arfeen  (336) 349-4544   °[facility] [place]  [employer] [place]   [facility] 1) [address], [place] °2) [address], [place] °3)  [address], [place]  (336) 349-3220 °(336) 342-7768 ° °(336) 342-8140   °[place] [facility] (336) 342-1394 or (336) 342-3537 (After Hours)    ° ° ° °

## 2014-04-14 NOTE — ED Notes (Signed)
Pt reports 3 days hx of bilateral foot swelling, now with blisters to lower legs. Pt c/o pain, numbness and tightness in feet. States SOB with ambulation. Denies hx of CHF. NO PCP. No meds currently for HTN or COPD. Pt awake, alert, oriented x4, NAD at present, lung sounds diminished.

## 2014-04-14 NOTE — ED Provider Notes (Signed)
CSN: [id number]     Arrival date & time 04/14/14  0749 History   First MD Initiated Contact with Patient 04/14/14 0804     Chief Complaint  Patient presents with  . Leg Swelling  . Foot Pain     (Consider location/radiation/quality/duration/timing/severity/associated sxs/prior Treatment) The history is provided by the patient.   Kylie Garner is a 53 y.o. female presenting with bilateral lower extremity edema which has been worsened the past 3 days although endorses prior similar symptoms.  In addition she has noticed increased exertional dyspnea without orthopnea.  She denies cough, fevers or chills and denies chest pain.  She was taking norvasc and lasix for htn and peripheral edema,  But ran out of these medicines more than 6 months ago.  She has been elevating her legs with no improvement.  She denies increased standing or weight bearing for prolonged periods.  Her legs feel tight but are not tender.  She also has a history of copd, denies wheezing.    Past Medical History  Diagnosis Date  . Depression   . Anxiety   . COPD (chronic obstructive pulmonary disease)   . Hypertension    Past Surgical History  Procedure Laterality Date  . Knee cartilage surgery Left   . Cesarean section  1993   History reviewed. No pertinent family history. History  Substance Use Topics  . Smoking status: Current Every Day Smoker -- 1.00 packs/day    Types: Cigarettes  . Smokeless tobacco: Not on file  . Alcohol Use: Yes   OB History   Grav Para Term Preterm Abortions TAB SAB Ect Mult Living                 Review of Systems  Constitutional: Negative for fever and chills.  HENT: Negative for congestion and sore throat.   Eyes: Negative.   Respiratory: Positive for shortness of breath. Negative for cough, chest tightness, wheezing and stridor.   Cardiovascular: Positive for leg swelling. Negative for chest pain and palpitations.  Gastrointestinal: Negative for nausea, vomiting and  abdominal pain.  Genitourinary: Negative.   Musculoskeletal: Negative for arthralgias, joint swelling and neck pain.  Skin: Negative.  Negative for rash and wound.  Neurological: Negative for dizziness, weakness, light-headedness, numbness and headaches.  Psychiatric/Behavioral: Negative.       Allergies  Review of patient's allergies indicates no known allergies.  Home Medications   Prior to Admission medications   Medication Sig Start Date End Date Taking? Authorizing Provider  sertraline (ZOLOFT) 100 MG tablet Take 100 mg by mouth daily.   Yes Historical Provider, MD  traZODone (DESYREL) 100 MG tablet Take 50-100 mg by mouth at bedtime as needed for sleep.   Yes Historical Provider, MD  amLODipine (NORVASC) 10 MG tablet Take 5 mg by mouth daily.    Historical Provider, MD  furosemide (LASIX) 40 MG tablet Take 1 tablet (40 mg total) by mouth daily. 04/13/13   Mora BellmanHannah S Merrell, PA-C  furosemide (LASIX) 40 MG tablet Take 1 tablet (40 mg total) by mouth daily. 04/14/14   Burgess AmorJulie Idol, PA-C   BP 114/72  Pulse 66  Temp(Src) 98.4 F (36.9 C) (Oral)  Resp 14  Wt 292 lb 2 oz (132.507 kg)  SpO2 100% Physical Exam  Nursing note and vitals reviewed. Constitutional: She appears well-developed and well-nourished.  HENT:  Head: Normocephalic and atraumatic.  Eyes: Conjunctivae are normal.  Neck: Normal range of motion.  Cardiovascular: Normal rate, regular rhythm, normal heart sounds  and intact distal pulses.   Pulmonary/Chest: Effort normal and breath sounds normal. She has no wheezes.  Decreased breath sounds throughout, trace bilateral rales.   Pitting edema of bilateral legs to upper tibia.  Dorsalis pedis pulses intact.  Skin of anterior shins with small vesicles.  No erythema or increased warmth.    Abdominal: Soft. Bowel sounds are normal. There is no tenderness.  Musculoskeletal: Normal range of motion.  Neurological: She is alert.  Skin: Skin is warm and dry.  Psychiatric: She  has a normal mood and affect.    ED Course  Procedures (including critical care time) Labs Review Labs Reviewed  I-STAT CHEM 8, ED - Abnormal; Notable for the following:    Glucose, Bld 107 (*)    All other components within normal limits  PRO B NATRIURETIC PEPTIDE  I-STAT TROPOININ, ED    Imaging Review Dg Chest 2 View  04/14/2014   CLINICAL DATA:  Hypertension and lower extremity swelling  EXAM: CHEST  2 VIEW  COMPARISON:  April 13, 2013  FINDINGS: There is no edema or consolidation. The heart size and pulmonary vascularity are normal. No adenopathy. No bone lesions. There is degenerative change in the thoracic spine.  IMPRESSION: No edema or consolidation.   Electronically Signed   By: Bretta BangWilliam  Woodruff M.D.   On: 04/14/2014 08:59     EKG Interpretation   Date/Time:  Friday April 14 2014 08:39:53 EDT Ventricular Rate:  73 PR Interval:  152 QRS Duration: 92 QT Interval:  404 QTC Calculation: 445 R Axis:   71 Text Interpretation:  Sinus rhythm Low voltage with right axis deviation  Confirmed by Fayrene FearingJAMES  MD, MARK ([id number]) on 04/14/2014 8:44:44 AM      MDM   Final diagnoses:  Peripheral edema    Patients labs and/or radiological studies were viewed and considered during the medical decision making and disposition process. Pts labs stable today, no evidence of renal insuffiency or chf.  She was placed back on her lasix.  Advised elevation, avoid dependency of legs for long periods of time.  She was given resources for obtaining pcp.  Did not replace her norvasc as her bp here was normal range.    Burgess AmorJulie Idol, PA-C 04/14/14 1015

## 2014-04-19 NOTE — ED Provider Notes (Signed)
Medical screening examination/treatment/procedure(s) were performed by non-physician practitioner and as supervising physician I was immediately available for consultation/collaboration.   EKG Interpretation   Date/Time:  Friday April 14 2014 08:39:53 EDT Ventricular Rate:  73 PR Interval:  152 QRS Duration: 92 QT Interval:  404 QTC Calculation: 445 R Axis:   71 Text Interpretation:  Sinus rhythm Low voltage with right axis deviation  Confirmed by Fayrene FearingJAMES  MD, MARK ([id number]) on 04/14/2014 8:44:44 AM        Rolland PorterMark James, MD 04/19/14 [phone]

## 2014-10-31 ENCOUNTER — Ambulatory Visit: Payer: Medicaid Other | Admitting: Family Medicine

## 2014-11-06 ENCOUNTER — Ambulatory Visit (INDEPENDENT_AMBULATORY_CARE_PROVIDER_SITE_OTHER): Payer: Medicaid Other

## 2014-11-06 ENCOUNTER — Ambulatory Visit (INDEPENDENT_AMBULATORY_CARE_PROVIDER_SITE_OTHER): Payer: Medicaid Other | Admitting: Family Medicine

## 2014-11-06 ENCOUNTER — Encounter: Payer: Self-pay | Admitting: Family Medicine

## 2014-11-06 VITALS — BP 130/66 | HR 100 | Temp 98.0°F | Ht 59.0 in | Wt 292.7 lb

## 2014-11-06 DIAGNOSIS — F17200 Nicotine dependence, unspecified, uncomplicated: Secondary | ICD-10-CM

## 2014-11-06 DIAGNOSIS — J441 Chronic obstructive pulmonary disease with (acute) exacerbation: Secondary | ICD-10-CM | POA: Insufficient documentation

## 2014-11-06 DIAGNOSIS — Z23 Encounter for immunization: Secondary | ICD-10-CM

## 2014-11-06 DIAGNOSIS — I8312 Varicose veins of left lower extremity with inflammation: Secondary | ICD-10-CM

## 2014-11-06 DIAGNOSIS — Z0289 Encounter for other administrative examinations: Secondary | ICD-10-CM

## 2014-11-06 DIAGNOSIS — J449 Chronic obstructive pulmonary disease, unspecified: Secondary | ICD-10-CM | POA: Insufficient documentation

## 2014-11-06 DIAGNOSIS — Z720 Tobacco use: Secondary | ICD-10-CM | POA: Insufficient documentation

## 2014-11-06 DIAGNOSIS — F32A Depression, unspecified: Secondary | ICD-10-CM | POA: Insufficient documentation

## 2014-11-06 DIAGNOSIS — G479 Sleep disorder, unspecified: Secondary | ICD-10-CM

## 2014-11-06 DIAGNOSIS — Z7189 Other specified counseling: Secondary | ICD-10-CM

## 2014-11-06 DIAGNOSIS — I8311 Varicose veins of right lower extremity with inflammation: Secondary | ICD-10-CM

## 2014-11-06 DIAGNOSIS — Z1329 Encounter for screening for other suspected endocrine disorder: Secondary | ICD-10-CM

## 2014-11-06 DIAGNOSIS — I872 Venous insufficiency (chronic) (peripheral): Secondary | ICD-10-CM

## 2014-11-06 DIAGNOSIS — F329 Major depressive disorder, single episode, unspecified: Secondary | ICD-10-CM | POA: Insufficient documentation

## 2014-11-06 DIAGNOSIS — Z8639 Personal history of other endocrine, nutritional and metabolic disease: Secondary | ICD-10-CM

## 2014-11-06 DIAGNOSIS — Z7689 Persons encountering health services in other specified circumstances: Secondary | ICD-10-CM

## 2014-11-06 DIAGNOSIS — Z87898 Personal history of other specified conditions: Secondary | ICD-10-CM

## 2014-11-06 LAB — COMPREHENSIVE METABOLIC PANEL
ALK PHOS: 120 U/L — AB (ref 39–117)
ALT: 12 U/L (ref 0–35)
AST: 13 U/L (ref 0–37)
Albumin: 3.7 g/dL (ref 3.5–5.2)
BILIRUBIN TOTAL: 0.3 mg/dL (ref 0.2–1.2)
BUN: 11 mg/dL (ref 6–23)
CALCIUM: 9.4 mg/dL (ref 8.4–10.5)
CO2: 29 meq/L (ref 19–32)
Chloride: 103 mEq/L (ref 96–112)
Creat: 0.81 mg/dL (ref 0.50–1.10)
GLUCOSE: 97 mg/dL (ref 70–99)
Potassium: 4.7 mEq/L (ref 3.5–5.3)
SODIUM: 138 meq/L (ref 135–145)
TOTAL PROTEIN: 7 g/dL (ref 6.0–8.3)

## 2014-11-06 LAB — POCT GLYCOSYLATED HEMOGLOBIN (HGB A1C): Hemoglobin A1C: 5.6

## 2014-11-06 LAB — LIPID PANEL
Cholesterol: 213 mg/dL — ABNORMAL HIGH (ref 0–200)
HDL: 38 mg/dL — AB (ref >39)
LDL CALC: 139 mg/dL — AB (ref 0–99)
TRIGLYCERIDES: 179 mg/dL — AB (ref <150)
Total CHOL/HDL Ratio: 5.6 Ratio
VLDL: 36 mg/dL (ref 0–40)

## 2014-11-06 LAB — TSH: TSH: 1.675 u[IU]/mL (ref 0.350–4.500)

## 2014-11-06 MED ORDER — ALBUTEROL SULFATE HFA 108 (90 BASE) MCG/ACT IN AERS: 2.0000 | INHALATION_SPRAY | Freq: Four times a day (QID) | RESPIRATORY_TRACT | Status: DC | PRN

## 2014-11-06 NOTE — Assessment & Plan Note (Signed)
Smokes 1 cig/day.  Patient is in Contemplative stage -Referral to Dr Raymondo BandKoval for continued smoking cessation counseling and PFTs

## 2014-11-06 NOTE — Assessment & Plan Note (Signed)
[facility]ications [facility] -Continue Zoloft as prescribed -Will continue to work along side of [facility] in monitoring patient's depression

## 2014-11-06 NOTE — Patient Instructions (Addendum)
It was a pleasure seeing you today!  Information regarding what we discussed is included in this packet.  Please feel free to call our office if any questions or concerns arise.  See me back in 4-6 weeks.  I will refer you to Dr Raymondo Band for PFTs and smoking cessation counseling.  I will contact you with the results of your labs.  I will mail your paperwork out to you as well.  Skylan Gift M. Nadine Counts, DO  PGY-1, [facility]    Venous Stasis or Chronic Venous Insufficiency Chronic venous insufficiency, also called venous stasis, is a condition that affects the veins in the legs. The condition prevents blood from being pumped through these veins effectively. Blood may no longer be pumped effectively from the legs back to the heart. This condition can range from mild to severe. With proper treatment, you should be able to continue with an active life. CAUSES  Chronic venous insufficiency occurs when the vein walls become stretched, weakened, or damaged or when valves within the vein are damaged. Some common causes of this include:  High blood pressure inside the veins (venous hypertension).  Increased blood pressure in the leg veins from long periods of sitting or standing.  A blood clot that blocks blood flow in a vein (deep vein thrombosis).  Inflammation of a superficial vein (phlebitis) that causes a blood clot to form. RISK FACTORS Various things can make you more likely to develop chronic venous insufficiency, including:  Family history of this condition.  Obesity.  Pregnancy.  Sedentary lifestyle.  Smoking.  Jobs requiring long periods of standing or sitting in one place.  Being a certain age. Women in their 90s and 53s and men in their 64s are more likely to develop this condition. SIGNS AND SYMPTOMS  Symptoms may include:   Varicose veins.  Skin breakdown or ulcers.  Reddened or discolored skin on the leg.  Brown, smooth, tight, and painful skin just above the  ankle, usually on the inside surface (lipodermatosclerosis).  Swelling. DIAGNOSIS  To diagnose this condition, your health care provider will take a medical history and do a physical exam. The following tests may be ordered to confirm the diagnosis:  Duplex ultrasound--A procedure that produces a picture of a blood vessel and nearby organs and also provides information on blood flow through the blood vessel.  Plethysmography--A procedure that tests blood flow.  A venogram, or venography--A procedure used to look at the veins using X-ray and dye. TREATMENT The goals of treatment are to help you return to an active life and to minimize pain or disability. Treatment will depend on the severity of the condition. Medical procedures may be needed for severe cases. Treatment options may include:   Use of compression stockings. These can help with symptoms and lower the chances of the problem getting worse, but they do not cure the problem.  Sclerotherapy--A procedure involving an injection of a material that "dissolves" the damaged veins. Other veins in the network of blood vessels take over the function of the damaged veins.  Surgery to remove the vein or cut off blood flow through the vein (vein stripping or laser ablation surgery).  Surgery to repair a valve. HOME CARE INSTRUCTIONS   Wear compression stockings as directed by your health care provider.  Only take over-the-counter or prescription medicines for pain, discomfort, or fever as directed by your health care provider.  Follow up with your health care provider as directed. SEEK MEDICAL CARE IF:  You have redness, swelling, or increasing pain in the affected area.  You see a red streak or line that extends up or down from the affected area.  You have a breakdown or loss of skin in the affected area, even if the breakdown is small.  You have an injury to the affected area. SEEK IMMEDIATE MEDICAL CARE IF:   You have an injury  and open wound in the affected area.  Your pain is severe and does not improve with medicine.  You have sudden numbness or weakness in the foot or ankle below the affected area, or you have trouble moving your foot or ankle.  You have a fever or persistent symptoms for more than 2-3 days.  You have a fever and your symptoms suddenly get worse. MAKE SURE YOU:   Understand these instructions.  Will watch your condition.  Will get help right away if you are not doing well or get worse. Document Released: 02/16/2007 Document Revised: 08/03/2013 Document Reviewed: 06/20/2013 [facility] Patient Information 2015 [employer] [place]. This information is not intended to replace advice given to you by your health care provider. Make sure you discuss any questions you have with your health care provider.

## 2014-11-06 NOTE — Progress Notes (Addendum)
Patient ID: Kylie Garner, female   DOB: [birth year], 54 y.o.   MRN: [id number]    Subjective: WU:JWJXBJYNWCC:establish care  HPI: Patient is a 54 y.o. female presenting to clinic today for establish care. Concerns today include:  1. Swelling in LE Patient reports that she has had a long standing h/o swelling in LE.  She states that her legs are sometimes painful when she walks around a lot.  She notices pain the following day.  She states, "my legs feel like i have bricks on them" at those times.  She has been presribed Lasix in the past in the ED, which she reports as not helpful.  Denies orthopnea, CP, SOB at rest, weeping from LE.    2. COPD Patient endorses SOB on exertion.  She is not on a controller medication at home.  She states that she had a rescue inhaler at one time but no longer has one.  She reports that she continues to smoke but is ready to stop soon (but not today).  When asked what is stopping her from smoking cessation, she states that she uses them for stress relief.  Denies SOB at rest, CP, cough, fevers.  3. Depression/Sleep disturbance Patient sees [facility].  She reports stability with depression and sleep with Zoloft and trazodone.  She voices no complaints at this time.  However, she does note that she sleeps on a cough.  She laughs when I ask her why not a bed and states that she lives with her mother and does not think that the bed is comfortable/ stable to sleep in because the mattress does not fit the box springs appropriately.  She endorses good sleep on the couch.  Denies SI/HI, poor sleep.  4. Morbid obesity/ h/o impaired glucose tolerance Patient states that she eats 1 meal daily consisting of sweet tea, a rice/pasta and a meat.  She states that she sometimes has vegetables.  She Denies snacking or sweetened beverages outside of dinnertime.  She reports that she has always been heavy.  She admits to not exercising adequately but does note that her mother has been exercising  more and that she is thinking about exercising with her.  She states that exercise is limited by her swelling in her LE.     History Reviewed: active smoker. Currently, 1cig/day but has been smoking since 54 years old. MedHx and FamHx updated in EMR. Health Maintenance: Mammogram due, flu shot today  ROS: All other systems reviewed and are negative.  Objective: Office vital signs reviewed. BP 130/66 mmHg  Pulse 100  Temp(Src) 98 F (36.7 C) (Oral)  Ht 4\' 11"  (1.499 m)  Wt 292 lb 11.2 oz (132.768 kg)  BMI 59.09 kg/m2  Physical Examination:  General: Awake, alert, obese female, pleasant, NAD Cardio: RRR, S1S2 heard, no murmurs appreciated Pulm: decreased air movement b/l, no wheezes, rhonchi or rales; no increased WOB GI: soft, obese, +BS Extremities: WWP, LE: +2 pitting edema b/l, with healing excoriation on Lateral aspect of LLE, skin is dry and flaky, mild-moderate erythematous changes of skin, +1 pulses bilaterally MSK: slow gait and normal station, uses RW for ambulation without difficulty Skin: as above Psych: normal speech, affect appropriate, mood stable, good eye contact  Results for orders placed or performed in visit on 11/06/14 (from the past 24 hour(s))  HgB A1c     Status: None   Collection Time: 11/06/14 11:07 AM  Result Value Ref Range   Hemoglobin A1C 5.6  Assessment: 54 y.o. female with venous stasis dermatitis, morbid obesity, COPD, difficulty sleeping and depression  Plan: See Problem List and After Visit Summary  Form completion for SCAT application done and mailed to patient today.  Raliegh Ip, DO PGY-1, [facility]

## 2014-11-06 NOTE — Assessment & Plan Note (Signed)
A1c 6.1 in prior labs.  Repeat A1c 5.6 today.  -Will continue to encourage lifestyle modification -Will continue to monitor in the setting of morbid obesity, Famhx of T2DM and heart disease, and poor physical activity.

## 2014-11-06 NOTE — Assessment & Plan Note (Signed)
Patient with self reported h/o COPD with possible emphysema.  She is not on any controller medications.  She is a smoker. -Albuterol PRN SOB -Schedule appt with Dr Raymondo BandKoval for PFTs -Smoking cessation counseling performed. -Return in 4-6 weeks for follow up and potential daily inhaler treatment

## 2014-11-06 NOTE — Assessment & Plan Note (Signed)
Patient previously on Lasix for LE edema.  Non-effective per patient. No evidence of infection on exam.  However healed excoriation present -Elevation of LE when seated -Compression hose -Ambulation/physical activity/weight loss encouraged -Patient to follow up in 4-6 weeks or sooner if concerns for worsening/infection

## 2014-11-06 NOTE — Assessment & Plan Note (Signed)
Patient reports that trazodone is helping with sleep. No complaints -[facility]

## 2014-11-06 NOTE — Assessment & Plan Note (Signed)
Activity limited by SOB with exertion and intermittent LE swelling 2/2 venous stasis. -Patient counseled on lifestyle changes -Encouraged physical activity in 3 minute bursts to start -TSH, CMET, A1c ordered -Will continue to monitor

## 2014-11-07 ENCOUNTER — Encounter: Payer: Self-pay | Admitting: Family Medicine

## 2014-12-13 ENCOUNTER — Ambulatory Visit: Payer: Medicaid Other | Admitting: Family Medicine

## 2014-12-29 ENCOUNTER — Encounter: Payer: Self-pay | Admitting: Family Medicine

## 2014-12-29 ENCOUNTER — Ambulatory Visit (INDEPENDENT_AMBULATORY_CARE_PROVIDER_SITE_OTHER): Payer: Medicaid Other | Admitting: Family Medicine

## 2014-12-29 VITALS — BP 117/75 | HR 90 | Ht 59.0 in | Wt 286.0 lb

## 2014-12-29 DIAGNOSIS — Z720 Tobacco use: Secondary | ICD-10-CM

## 2014-12-29 DIAGNOSIS — J449 Chronic obstructive pulmonary disease, unspecified: Secondary | ICD-10-CM

## 2014-12-29 DIAGNOSIS — F17200 Nicotine dependence, unspecified, uncomplicated: Secondary | ICD-10-CM

## 2014-12-29 MED ORDER — NICOTINE 7 MG/24HR TD PT24: 7.0000 mg | MEDICATED_PATCH | Freq: Every day | TRANSDERMAL | Status: DC

## 2014-12-29 NOTE — Assessment & Plan Note (Signed)
Patient has selected TODAY as her stop date.  Barriers to quitting: other ppl in home smoke -Nicotine 7 patches Rx'd today -Patient to see Dr Raymondo BandKoval for PFTs.  If continued difficulty quitting w/ patches, patient to consider scheduling appt for continued smoking cessation options with Dr Raymondo BandKoval.

## 2014-12-29 NOTE — Assessment & Plan Note (Signed)
DOE likely 2/2 to deconditioning and underlying COPD.  Uses Albuterol PRN.  Continues to smoke but wants to quit TODAY -Patient to schedule appt w/ Dr Raymondo BandKoval for PFTs -Follow up in 1-2 months

## 2014-12-29 NOTE — Patient Instructions (Signed)
It was a pleasure seeing you today, Ms Kylie Garner!  Information regarding what we discussed is included in this packet.  Please make an appointment to see me in 1-2 months.  I have sent in nicotine patches for you to help with smoking cessation.  Also, please make sure schedule an appointment with Dr Raymondo BandKoval for pulmonary function tests.  Congratulations on your weight loss.  Keep up the good work with portion control and getting your body moving.    Please feel free to call our office at [phone] if any questions or concerns arise.  Warm Regards, Ashly M. Nadine CountsGottschalk, DO

## 2014-12-29 NOTE — Progress Notes (Signed)
Patient ID: Cleon Gustinammy L Dubin, female   DOB: 1961/03/25, 54 y.o.   MRN: [id number]    Subjective: CC: DOE HPI: Patient is a 54 y.o. female presenting to clinic today for "legs" but actually wanting to talk about shortness of breath on exertion. Concerns today include:  Dyspnea on exertion Patient reports that she has had DOE for several years.  She reports that she is able to walk to mailbox but gets winded.  She also reports getting winded with washing the dishes for about 5 mins.  She states that she can sit down for 15 mins and SOB goes away.  She reports that she continues to sleep upright in a chair at night time.  She does note that when she does sleep in a bed she is able to sleep flat on her side comfortably.  She reports too much pressure from her weight when attempting to sleep flat on her back.  She is getting a mattress soon.  Uses albuterol intermittently, never more than 3 times per week.  Has woken up with cough once in the last month.  Has good relief from Albuterol.  Denies CP, change in vision, dizziness.  P  Smoking Patient reports that she continues to smoke.  She smokes only 1 cigarette daily.  She states that she has been a smoker since 54 years old.  She used to smoke 1ppd for several years but cut down to 1 cig in recent years.  She states that she finds it difficult to quit because other people in her home smoke.  She is wanting to stop now in light of her trying to get better control of her health.  She has never used anything before to try and help stop smoking.  She is asking for patches today.  Goal of TODAY being her quit day.    Obesity After our last visit, patient has started becoming more active.  She states that she is parking further away and getting up and moving around during commercials as we discussed.  She reports that she has also been watching her portion sizes.    Social History Reviewed: current smoker. FamHx and MedHx updated.  Please see EMR  ROS: All  other systems reviewed and are negative.  Objective: Office vital signs reviewed. BP 117/75 mmHg  Pulse 90  Ht 4\' 11"  (1.499 m)  Wt 286 lb (129.729 kg)  BMI 57.73 kg/m2  SpO2 97%  Physical Examination:  General: Awake, alert, obese female, NAD, uses walker to ambulate HEENT: Normal, EOMI Cardio: distant heart tones but RRR, S1S2 heard, no murmurs appreciated Pulm: breath sounds difficult to auscultate 2/2 to habitus, poor air movement b/l but no wheeze appreciated Extremities: WWP, +2 edema b/l LE (improved from previous visit)  Assessment: 54 y.o. female with DOE likely 2/2 COPD vs deconditioning/body habitus , Tobacco use disorder, Morbid Obesity  Plan: See Problem List and After Visit Summary   Raliegh IpAshly M Gottschalk, DO PGY-1, [facility]

## 2014-12-29 NOTE — Assessment & Plan Note (Signed)
Patient with 7lb weight loss since last visit.  ?fluid vs actual weight loss.   -Continue diet modification -Encouraged continued increase in activity

## 2014-12-30 NOTE — Progress Notes (Signed)
I was preceptor the day of this visit.   

## 2015-01-05 ENCOUNTER — Encounter: Payer: Self-pay | Admitting: Pharmacist

## 2015-01-05 ENCOUNTER — Ambulatory Visit (INDEPENDENT_AMBULATORY_CARE_PROVIDER_SITE_OTHER): Payer: Medicaid Other | Admitting: Pharmacist

## 2015-01-05 VITALS — BP 104/69 | HR 76 | Ht 60.0 in | Wt 293.0 lb

## 2015-01-05 DIAGNOSIS — F17200 Nicotine dependence, unspecified, uncomplicated: Secondary | ICD-10-CM

## 2015-01-05 DIAGNOSIS — Z720 Tobacco use: Secondary | ICD-10-CM

## 2015-01-05 DIAGNOSIS — J449 Chronic obstructive pulmonary disease, unspecified: Secondary | ICD-10-CM

## 2015-01-05 NOTE — Assessment & Plan Note (Signed)
Spirometry evaluation reveals Moderate restrictive lung disease in patient with BMI of 57. Post nebulized albuterol tx revealed moderate restriction with no appreciable change in lung function.  Reviewed results of pulmonary function tests with patient.  Pt verbalized understanding of results and education. Best plan to improve breathing at this point is weight loss, exercise, and smoking cessation. Patient verbalized understanding and seemed motivated to follow recommended plan. Written pt instructions provided.  F/U Clinic visit via telephone call on quit date of January 12, 2015.   Total time in face to face counseling 35 minutes.  Patient seen with Cathlean CowerKristen Gray, PharmD Candidate and Juanita CraverStacey Karl, PharmD, BCPS.   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 Major barriers are people who currently smoke in the home. No therapy initiated at this point due to patient preference and the minimal amount of cigarettes (1) she currently smokes daily.  Written information provided. Provided information on 1 800-QUIT NOW support program.  F/U phone call March 18th

## 2015-01-05 NOTE — Progress Notes (Signed)
Patient ID: Kylie Garner, female   DOB: 1961-09-20, 54 y.o.   MRN: [id number] Reviewed: Agree with Dr. Macky LowerKoval's documentation and management.

## 2015-01-05 NOTE — Patient Instructions (Signed)
It was great to see you today!  We decided on a quit date of next Friday, March 18th. We will call you on that date to make sure you feel ready and confident to quit. We will continue to follow-up with you each week so that you feel confident in your cessation plan.   You may continue to use the albuterol as needed for shortness of breath.   Your lung function test reported restrictive lung disease. The most important things you can do to help your breathing are quit smoking and walk/exercise more frequently to build up strength and promote weight loss.

## 2015-01-05 NOTE — Assessment & Plan Note (Signed)
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 Major barriers are people who currently smoke in the home. No therapy initiated at this point due to patient preference and the minimal amount of cigarettes (1) she currently smokes daily.  Written information provided. Provided information on 1 800-QUIT NOW support program.  F/U phone call March 18th

## 2015-01-05 NOTE — Assessment & Plan Note (Signed)
BMI 57 - encouraged increase in activity (walking).

## 2015-01-05 NOTE — Progress Notes (Signed)
S:    Patient arrives alone ambulating with a rolling walker.    Presents for lung function evaluation.   Patient reports breathing has been difficult with ambulation and uses albuterol about one time every day. If she doesn't smoke, she feels ok, but if she does smoke she experiences coughing and dyspnea. Walking makes dyspnea worse. She cannot lay on her back at night due to dyspnea but can lay flat on her side. Smokes 1-2 cigarettes per day on average. Has quit smoking for 9 months in the past when her children were young. Barriers for cessation include lack of social support as her mother smokes at home which makes her want to smoke. She also reports she has one pack of cigarettes left at home. Patient open to consideration of a setting a quit date.   Patient reports last dose of COPD medications was 3am this morning (< 6 hours ago) because she had to take her dogs out and the walking made her short of breath. Always uses walker except when walking dogs.   Age when started using tobacco on a daily basis 14. Number of Cigarettes per day 1. Brand smoked Menthols (any kind). Estimated Nicotine Content per Cigarette (mg) 1mg .  Estimated Nicotine intake per day 2mg .   Denies waking to smoke.    Most recent quit attempt in 1993. Longest time ever been tobacco free 9 months when pregnant. What Medications (NRT, bupropion, varenicline): none.  Rates IMPORTANCE of quitting tobacco on 1-10 scale of 10. Rates READINESS of quitting tobacco on 1-10 scale of 10. Rates CONFIDENCE of quitting tobacco on 1-10 scale of 10. Triggers to use tobacco include other people in the home smoking.   O: CAT score= 27 See Documentation Flowsheet - CAT/COPD for complete symptom scoring. PE:  Audible wheezing during test See "scanned report" or Documentation Flowsheet (discrete results - PFTs) for  Spirometry results. Patient provided good effort while attempting spirometry.   Lung Age = 473 Albuterol Neb  Lot#:  [id number]     Exp. August 2017  A/P: Spirometry evaluation reveals Moderate restrictive lung disease in patient with BMI of 57. Post nebulized albuterol tx revealed moderate restriction with no appreciable change in lung function.  Reviewed results of pulmonary function tests with patient.  Pt verbalized understanding of results and education. Best plan to improve breathing at this point is weight loss, exercise, and smoking cessation. Patient verbalized understanding and seemed motivated to follow recommended plan. Written pt instructions provided.  F/U Clinic visit via telephone call on quit date of January 12, 2015.   Total time in face to face counseling 35 minutes.  Patient seen with Cathlean CowerKristen Gray, PharmD Candidate and Juanita CraverStacey Karl, PharmD, BCPS.   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 Major barriers are people who currently smoke in the home. No therapy initiated at this point due to patient preference and the minimal amount of cigarettes (1) she currently smokes daily.  Written information provided. Provided information on 1 800-QUIT NOW support program.  F/U phone call March 18th. Total time in face-to-face counseling 35 minutes.  Patient seen with Cathlean CowerKristen Gray, PharmD Candidate and Juanita CraverStacey Karl, PharmD, BCPS.    .Marland Kitchen

## 2015-01-29 ENCOUNTER — Ambulatory Visit: Payer: Medicaid Other | Admitting: Family Medicine

## 2015-07-13 ENCOUNTER — Emergency Department (HOSPITAL_COMMUNITY)
Admission: EM | Admit: 2015-07-13 | Discharge: 2015-07-13 | Disposition: A | Discharge status: Home / Self Care | Payer: Medicaid Other | Attending: Emergency Medicine | Admitting: Emergency Medicine

## 2015-07-13 ENCOUNTER — Other Ambulatory Visit: Payer: Self-pay | Admitting: Physician Assistant

## 2015-07-13 ENCOUNTER — Encounter (HOSPITAL_COMMUNITY): Payer: Self-pay | Admitting: *Deleted

## 2015-07-13 DIAGNOSIS — Y929 Unspecified place or not applicable: Secondary | ICD-10-CM | POA: Insufficient documentation

## 2015-07-13 DIAGNOSIS — J449 Chronic obstructive pulmonary disease, unspecified: Secondary | ICD-10-CM | POA: Diagnosis not present

## 2015-07-13 DIAGNOSIS — I10 Essential (primary) hypertension: Secondary | ICD-10-CM | POA: Insufficient documentation

## 2015-07-13 DIAGNOSIS — F418 Other specified anxiety disorders: Secondary | ICD-10-CM | POA: Diagnosis not present

## 2015-07-13 DIAGNOSIS — S8391XA Sprain of unspecified site of right knee, initial encounter: Secondary | ICD-10-CM | POA: Diagnosis not present

## 2015-07-13 DIAGNOSIS — Z720 Tobacco use: Secondary | ICD-10-CM | POA: Insufficient documentation

## 2015-07-13 DIAGNOSIS — Y939 Activity, unspecified: Secondary | ICD-10-CM | POA: Diagnosis not present

## 2015-07-13 DIAGNOSIS — Y998 Other external cause status: Secondary | ICD-10-CM | POA: Diagnosis not present

## 2015-07-13 DIAGNOSIS — F329 Major depressive disorder, single episode, unspecified: Secondary | ICD-10-CM | POA: Diagnosis not present

## 2015-07-13 DIAGNOSIS — X58XXXA Exposure to other specified factors, initial encounter: Secondary | ICD-10-CM | POA: Diagnosis not present

## 2015-07-13 DIAGNOSIS — M25561 Pain in right knee: Secondary | ICD-10-CM

## 2015-07-13 DIAGNOSIS — Z79899 Other long term (current) drug therapy: Secondary | ICD-10-CM | POA: Diagnosis not present

## 2015-07-13 DIAGNOSIS — S8991XA Unspecified injury of right lower leg, initial encounter: Secondary | ICD-10-CM | POA: Diagnosis present

## 2015-07-13 DIAGNOSIS — S86911A Strain of unspecified muscle(s) and tendon(s) at lower leg level, right leg, initial encounter: Secondary | ICD-10-CM

## 2015-07-13 MED ORDER — NAPROXEN 500 MG PO TABS: 500.0000 mg | ORAL_TABLET | Freq: Two times a day (BID) | ORAL | Status: DC

## 2015-07-13 MED ORDER — NAPROXEN 250 MG PO TABS
500.0000 mg | ORAL_TABLET | Freq: Once | ORAL | Status: AC
  Administered 2015-07-13: 500 mg via ORAL
  Filled 2015-07-13: qty 2

## 2015-07-13 NOTE — Discharge Instructions (Signed)

## 2015-07-13 NOTE — ED Provider Notes (Signed)
CSN: [id number]     Arrival date & time 07/13/15  1641 History  This chart was scribed for non-physician practitioner Fayrene Helper, PA-C working with Laurence Spates, MD by Lyndel Safe, ED Scribe. This patient was seen in room TR09C/TR09C and the patient's care was started at 5:11 PM.   Chief Complaint  Patient presents with  . Knee Pain   The history is provided by the patient. No language interpreter was used.   HPI Comments: Kylie Garner is a 54 y.o. female who presents to the Emergency Department complaining of sudden onset, constant, moderate right knee pain and warmth onset 3 days ago. Pt was evaluated by her orthopedist 4 days ago for evaluation of her right knee. She reports onset of the pain the next day after this visit 3 days ago which she attributes to her doctor lifting and turning her right leg. She has a history of similar pain in her left knee for which she had surgery for. Her pain is exacerbated with touch, weight bearing, and ambulation. She has not taken any alleviating medication for her knee pain. Pt ambulates with a walker at baseline. Denies fevers or chills, rashes, or right ankle pain.   Past Medical History  Diagnosis Date  . Depression   . Anxiety   . COPD (chronic obstructive pulmonary disease)   . Hypertension    Past Surgical History  Procedure Laterality Date  . Knee cartilage surgery Left   . Cesarean section  1993   Family History  Problem Relation Age of Onset  . Heart disease Father   . Diabetes Father    Social History  Substance Use Topics  . Smoking status: Current Every Day Smoker -- 0.75 packs/day    Types: Cigarettes  . Smokeless tobacco: None  . Alcohol Use: 0.0 oz/week    0 Standard drinks or equivalent per week   OB History    No data available     Review of Systems  Constitutional: Negative for fever and chills.  Musculoskeletal: Positive for arthralgias ( right knee ).  Skin: Negative for rash.   Allergies  Review  of patient's allergies indicates no known allergies.  Home Medications   Prior to Admission medications   Medication Sig Start Date End Date Taking? Authorizing Provider  albuterol (PROVENTIL HFA;VENTOLIN HFA) 108 (90 BASE) MCG/ACT inhaler Inhale 2 puffs into the lungs every 6 (six) hours as needed for wheezing or shortness of breath. 11/06/14   Ashly Hulen Skains, DO  sertraline (ZOLOFT) 100 MG tablet Take 100 mg by mouth daily.    Historical Provider, MD  traZODone (DESYREL) 100 MG tablet Take 50-100 mg by mouth at bedtime as needed for sleep.    Historical Provider, MD   There were no vitals taken for this visit. Physical Exam  Constitutional: She appears well-developed and well-nourished. No distress.  HENT:  Head: Normocephalic.  Eyes: Conjunctivae are normal.  Neck: No JVD present.  Cardiovascular: Intact distal pulses.   Pulmonary/Chest: Effort normal. No respiratory distress.  Musculoskeletal: She exhibits tenderness.  Right knee; mild warmth noted with diffuse tenderness throughout the knee without crepitus or deformity; sensation and strength intact, NVI.    Neurological: She is alert. Coordination normal.  Skin: Skin is warm. No rash noted. No erythema. No pallor.  Psychiatric: She has a normal mood and affect. Her behavior is normal.  Nursing note and vitals reviewed.   ED Course  Procedures   COORDINATION OF CARE: 5:18 PM suspect R  knee strain/sprain.  Doubt septic joint or acute fx/dislocation.  Discussed treatment plan which includes with pt. Will order application of ace wrap to right knee and prescribe naproxen. Pt acknowledges and agrees to plan.   MDM   Final diagnoses:  Knee strain, right, initial encounter    BP 121/42 mmHg  Pulse 72  Resp 18  SpO2 94%   I personally performed the services described in this documentation, which was scribed in my presence. The recorded information has been reviewed and is accurate.     Fayrene Helper, PA-C 07/13/15  2250  Zadie Rhine, MD 07/14/15 [phone]

## 2015-07-13 NOTE — ED Notes (Signed)
Poor historian  She reports that she has had rt knee pain for 3 days.  She is scheduled for a mri in the future.  She saw her ortho doctor on the 12th for the same pain ???

## 2015-07-28 ENCOUNTER — Other Ambulatory Visit: Payer: Medicaid Other

## 2015-07-30 ENCOUNTER — Ambulatory Visit: Payer: Medicaid Other | Admitting: Family Medicine

## 2015-08-07 ENCOUNTER — Ambulatory Visit
Admission: RE | Admit: 2015-08-07 | Discharge: 2015-08-07 | Disposition: A | Discharge status: Home / Self Care | Payer: Medicaid Other | Source: Ambulatory Visit | Attending: Physician Assistant | Admitting: Physician Assistant

## 2015-08-07 DIAGNOSIS — M25561 Pain in right knee: Secondary | ICD-10-CM

## 2015-11-14 ENCOUNTER — Encounter: Payer: Self-pay | Admitting: Family Medicine

## 2015-11-14 ENCOUNTER — Ambulatory Visit (INDEPENDENT_AMBULATORY_CARE_PROVIDER_SITE_OTHER): Payer: Medicaid Other | Admitting: Family Medicine

## 2015-11-14 VITALS — Temp 98.5°F | Wt 281.4 lb

## 2015-11-14 DIAGNOSIS — J449 Chronic obstructive pulmonary disease, unspecified: Secondary | ICD-10-CM | POA: Diagnosis present

## 2015-11-14 DIAGNOSIS — F17200 Nicotine dependence, unspecified, uncomplicated: Secondary | ICD-10-CM | POA: Diagnosis not present

## 2015-11-14 DIAGNOSIS — J441 Chronic obstructive pulmonary disease with (acute) exacerbation: Secondary | ICD-10-CM

## 2015-11-14 DIAGNOSIS — R0902 Hypoxemia: Secondary | ICD-10-CM | POA: Insufficient documentation

## 2015-11-14 MED ORDER — AEROCHAMBER PLUS FLO-VU LARGE MISC: 1.0000 | Freq: Once | Status: DC

## 2015-11-14 MED ORDER — AZITHROMYCIN 250 MG PO TABS: ORAL_TABLET | ORAL | Status: DC

## 2015-11-14 MED ORDER — ALBUTEROL SULFATE (2.5 MG/3ML) 0.083% IN NEBU: 2.5000 mg | INHALATION_SOLUTION | Freq: Four times a day (QID) | RESPIRATORY_TRACT | Status: DC | PRN

## 2015-11-14 MED ORDER — PREDNISONE 20 MG PO TABS: 40.0000 mg | ORAL_TABLET | Freq: Every day | ORAL | Status: DC

## 2015-11-14 MED ORDER — ALBUTEROL SULFATE HFA 108 (90 BASE) MCG/ACT IN AERS: 2.0000 | INHALATION_SPRAY | Freq: Four times a day (QID) | RESPIRATORY_TRACT | Status: DC | PRN

## 2015-11-14 NOTE — Assessment & Plan Note (Signed)
COPD exacerbation with increased shortness of breath, cough and sputum production.  Satting well on room air with normal respiratory effort.  No indications of pneumonia or flu - Z-Pak, prednisone 40 mg 5 days - Refilled albuterol MDI and nebulizer - Given prescription for spacer - Follow-up with PCP in 2-3 weeks for COPD evaluation and medication optimization

## 2015-11-14 NOTE — Assessment & Plan Note (Signed)
Continues to smoke anywhere from 1 cigarette to 1 pack a day depending on stress - Advise follow-up with Dr. Raymondo Band for smoking cessation - Interested in Wellbutrin

## 2015-11-14 NOTE — Patient Instructions (Signed)
It was great seeing you today.  You're coughing and shortness of breath are due to COPD exacerbation.   1. Take azithromycin (2 pills) 500 mg on day 1 followed by (1 pill) 250 mg on days 2 through 5.  2. Take prednisone (2 pills) 40 mg for 5 days 3. Use albuterol nebulizer or albuterol inhaler with spacer as needed every 6 hours to help with shortness of breath.    Please bring all your medications to every doctors visit  Sign up for My Chart to have easy access to your labs results, and communication with your Primary care physician.  Next Appointment  Please call to make an appointment with Dr Nadine Counts in 2-3 weeks for COPD and consideration of obstructed sleep apnea   Schedule appointment with Dr Raymondo Band for smoking cessation and COPD  If you have any questions or concerns before then, please call the clinic at [phone].  Take Care,   Dr Wenda Low

## 2015-11-14 NOTE — Progress Notes (Signed)
   Subjective:    Patient ID: Kylie Garner, female    DOB: Oct 10, 1961, 55 y.o.   MRN: [id number]  Seen for Same day visit for   CC: SOB  She reports worsening shortness of breath, cough and sputum production for 2 days.  Also reports nasal congestion and sore throat.  Subjective fevers at home, but denies headache, chest pain, or myalgias.  Received flu shot this year.  Denies any additional COPD exacerbations within the past year.  Has albuterol that she uses as needed, but has been out for several weeks.  Continues to smoke anywhere from 1 cigarette a day to 1 pack a day depending on stress.   Review of Systems   See HPI for ROS. Objective:  Temp(Src) 98.5 F (36.9 C) (Oral)  Wt 281 lb 6.4 oz (127.642 kg)  SpO2 94%  General: NAD Cardiac: RRR, normal heart sounds, no murmurs. 2+ radial and PT pulses bilaterally Respiratory: Decreased breath sounds bilaterally, diffuse exspiratory wheezing; normal effort Skin: warm and dry, no rashes noted    Assessment & Plan:   COPD exacerbation (HCC) COPD exacerbation with increased shortness of breath, cough and sputum production.  Satting well on room air with normal respiratory effort.  No indications of pneumonia or flu - Z-Pak, prednisone 40 mg 5 days - Refilled albuterol MDI and nebulizer - Given prescription for spacer - Follow-up with PCP in 2-3 weeks for COPD evaluation and medication optimization  Tobacco use disorder Continues to smoke anywhere from 1 cigarette to 1 pack a day depending on stress - Advise follow-up with Dr. Raymondo Band for smoking cessation - Interested in Wellbutrin

## 2015-11-20 ENCOUNTER — Ambulatory Visit (INDEPENDENT_AMBULATORY_CARE_PROVIDER_SITE_OTHER): Payer: Medicaid Other | Admitting: Pharmacist

## 2015-11-20 ENCOUNTER — Encounter: Payer: Self-pay | Admitting: Pharmacist

## 2015-11-20 DIAGNOSIS — F17200 Nicotine dependence, unspecified, uncomplicated: Secondary | ICD-10-CM | POA: Diagnosis present

## 2015-11-20 MED ORDER — BUPROPION HCL ER (XL) 150 MG PO TB24: 150.0000 mg | ORAL_TABLET | Freq: Every day | ORAL | Status: DC

## 2015-11-20 MED ORDER — NICOTINE POLACRILEX 4 MG MT LOZG: 4.0000 mg | LOZENGE | OROMUCOSAL | Status: DC | PRN

## 2015-11-20 NOTE — Assessment & Plan Note (Signed)
Mild Nicotine Dependence of 38 years duration in a patient who is good candidate for success b/c of pt willingness and motivation to quit. Initiated Nicotine lozenges 4 mg PRN. Patient counseled on purpose, proper use, and potential adverse effects. Initiated bupropion xl 150 mg daily . Denies history of seizures. Patient counseled on purpose, proper use, and potential adverse effects, including insomnia, and potential change in mood. Pt set quit date for 12/03/15. Would consider PFT after pt quits smoking.

## 2015-11-20 NOTE — Patient Instructions (Signed)
Thank you for coming in today.  Congratulations on setting a smoking quit date for February 6th  Please start Bupropion (Wellbutrin XL) 150 mg once daily in the morning Please start Nicotine lozenges 4 mg as needed for cigarette cravings  Call 1-800-QUIT-NOW and enroll in the smoking cessation program.   Please return to see Dr Raymondo Band in 3 to 4 weeks

## 2015-11-20 NOTE — Progress Notes (Signed)
S:  Patient arrives in good spirits, ambulating alone.   Patient arrives for evaluation/assistance with tobacco dependence.   Age when started using tobacco on a daily basis ~55 years old.   Number of Cigarettes per day 0.5 cig/day (up to 1 PPD when stressed, reports this happens 1x every 2 weeks) Brand smoked Menthol Times. Smokes first cigarette 60-120 minutes after waking.  Reports smoking 1 cigarette over 2 days.     Medications (NRT, bupropion, varenicline) used in prior in past cessation efforts include: Pt reports never trying any smoking cessation medications.   Rates IMPORTANCE of quitting tobacco on 1-10 scale of 10. Rates CONFIDENCE of quitting tobacco on 1-10 scale of 10.  Most common triggers to use tobacco include; AM coffee   Motivation to quit: Per pt is tired of smoking and realizes the bad effects that smoking has led to in her mother with COPD and emphysema. Pt reports breathing is around the same as when the PFT was performed in 12/2014.  A/P: Mild Nicotine Dependence of 38 years duration in a patient who is good candidate for success b/c of pt willingness and motivation to quit. Initiated Nicotine lozenges 4 mg PRN. Patient counseled on purpose, proper use, and potential adverse effects. Initiated bupropion xl 150 mg daily . Denies history of seizures. Patient counseled on purpose, proper use, and potential adverse effects, including insomnia, and potential change in mood. Pt set quit date for 12/03/15. Would consider PFT after pt quits smoking.   Written information provided. Provided information on 1 800-QUIT NOW support program.  F/U Rx Clinic Visit in 3-4 weeks  Total time in face-to-face counseling 40 minutes.  Patient seen with Marlinda Mike, PharmD Candidate, and Hazle Nordmann, PharmD Resident.

## 2015-11-21 NOTE — Progress Notes (Signed)
Patient ID: Kylie Garner, female   DOB: January 25, 1961, 55 y.o.   MRN: [id number] Reviewed: Agree with Dr. Macky Lower documentation and management.

## 2015-12-03 ENCOUNTER — Ambulatory Visit (INDEPENDENT_AMBULATORY_CARE_PROVIDER_SITE_OTHER): Payer: Medicaid Other | Admitting: Family Medicine

## 2015-12-03 ENCOUNTER — Encounter: Payer: Self-pay | Admitting: Family Medicine

## 2015-12-03 VITALS — BP 122/58 | HR 74 | Temp 98.6°F | Ht 60.0 in | Wt 270.3 lb

## 2015-12-03 DIAGNOSIS — J449 Chronic obstructive pulmonary disease, unspecified: Secondary | ICD-10-CM

## 2015-12-03 DIAGNOSIS — F17200 Nicotine dependence, unspecified, uncomplicated: Secondary | ICD-10-CM

## 2015-12-03 DIAGNOSIS — G4730 Sleep apnea, unspecified: Secondary | ICD-10-CM

## 2015-12-03 MED ORDER — SERTRALINE HCL 100 MG PO TABS: 100.0000 mg | ORAL_TABLET | Freq: Every day | ORAL | Status: DC

## 2015-12-03 NOTE — Patient Instructions (Addendum)
I have placed an order to have you evaluated for sleep apnea.  When you are ready for refills on your medications call me and I will send them in.  Otherwise, plan to follow up in 3-6 months for annual exam.  Congratulations on STOPPING SMOKING!!

## 2015-12-03 NOTE — Progress Notes (Signed)
    Subjective: CC: smoking cessation, COPD, sleep apnea HPI: Kylie Garner is a 55 y.o. female presenting to clinic today for office visit. Concerns today include:  1. Smoking cessation Patient saw Dr Raymondo Band a couple of weeks ago.  She notes that she stopped smoking 1 week ago.  She is tolerating Wellbutrin well.  She denies cravings, insomnia.  She reports good energy.  Denies cough, SOB.  2. COPD Patient reports that she is using Albuterol with activity.  Uses Albuterol before exercise.  She is using this once daily.  No night time cough, wheeze or SOB.    3. Sleep apnea Patient was recommended to be evaluated for sleep apnea.  She reports that she has low energy throughout the day despite how much sleep she gets.  She is unsure of snoring.  Denies waking up feeling choked or SOB.  She endorses increased appetite through the day.  No known family history of sleep disorders.  Father with MI at 24 yo.  No h/o of stroke in the immediate family that she knows of.  Social History Reviewed: former smoker. FamHx and MedHx reviewed.  Please see EMR.  ROS: Per HPI  Objective: Office vital signs reviewed. BP 122/58 mmHg  Pulse 74  Temp(Src) 98.6 F (37 C) (Oral)  Ht 5' (1.524 m)  Wt 270 lb 4.8 oz (122.607 kg)  BMI 52.79 kg/m2  Physical Examination:  General: Awake, alert, morbidly obese, No acute distress HEENT: Normal, MMM, large neck Cardio: regular rate and rhythm, S1S2 heard, no murmurs appreciated Pulm: globally decreased breath sounds, no wheezes, rhonchi or rales MSK: Normal gait and station  Assessment/ Plan: 55 y.o. female   1. Sleep apnea.  High suspicion, given body habitus. - Split night study; Future - Will order CPAP machine as appropriate  2. Tobacco use disorder. Currently in remission.  Has not smoked for 1 week. - Continue Wellbutrin. Reviewed proper use of medication. - Follow up with Dr Raymondo Band as scheduled  3. Chronic obstructive pulmonary disease,  unspecified COPD type (HCC) - Continue albuterol PRN - Will need to consider adding additional agent if needing Albuterol daily.  Since she has recently stopped smoking will give some time to see if she continues to need ALbuterol daily or now   Raliegh Ip, DO PGY-2, [facility]

## 2015-12-13 ENCOUNTER — Ambulatory Visit: Payer: Medicaid Other | Admitting: Pharmacist

## 2015-12-20 ENCOUNTER — Telehealth: Payer: Self-pay | Admitting: *Deleted

## 2015-12-20 NOTE — Telephone Encounter (Signed)
Called patient to offer flu vaccine, however, VM picked up.  Left message on patient's voice mail to return call. Johnmichael Melhorn L, RN   

## 2016-01-13 ENCOUNTER — Ambulatory Visit (HOSPITAL_BASED_OUTPATIENT_CLINIC_OR_DEPARTMENT_OTHER): Payer: Medicaid Other | Attending: Family Medicine

## 2016-01-13 VITALS — Ht 61.0 in | Wt 270.0 lb

## 2016-01-13 DIAGNOSIS — G4730 Sleep apnea, unspecified: Secondary | ICD-10-CM | POA: Diagnosis present

## 2016-01-13 DIAGNOSIS — Z794 Long term (current) use of insulin: Secondary | ICD-10-CM | POA: Diagnosis not present

## 2016-01-13 DIAGNOSIS — G4733 Obstructive sleep apnea (adult) (pediatric): Secondary | ICD-10-CM | POA: Insufficient documentation

## 2016-01-13 DIAGNOSIS — R0683 Snoring: Secondary | ICD-10-CM | POA: Diagnosis not present

## 2016-01-13 DIAGNOSIS — E669 Obesity, unspecified: Secondary | ICD-10-CM | POA: Diagnosis not present

## 2016-01-13 DIAGNOSIS — Z79899 Other long term (current) drug therapy: Secondary | ICD-10-CM | POA: Diagnosis not present

## 2016-01-13 DIAGNOSIS — Z6841 Body Mass Index (BMI) 40.0 and over, adult: Secondary | ICD-10-CM | POA: Insufficient documentation

## 2016-01-13 DIAGNOSIS — E119 Type 2 diabetes mellitus without complications: Secondary | ICD-10-CM | POA: Diagnosis not present

## 2016-01-13 DIAGNOSIS — R5383 Other fatigue: Secondary | ICD-10-CM | POA: Diagnosis not present

## 2016-01-15 ENCOUNTER — Encounter (HOSPITAL_BASED_OUTPATIENT_CLINIC_OR_DEPARTMENT_OTHER): Payer: Medicaid Other

## 2016-01-19 DIAGNOSIS — G4730 Sleep apnea, unspecified: Secondary | ICD-10-CM | POA: Diagnosis not present

## 2016-01-19 NOTE — Progress Notes (Signed)
Patient Name: Kylie Garner, Kylie Garner Date: 01/13/2016 Gender: Female D.O.B: [birth year] Age (years): 54 Referring Provider: Talbert Cage Height (inches): 61 Interpreting Physician: Baird Lyons MD, ABSM Weight (lbs): 270 RPSGT: Baxter Flattery BMI: 51 MRN: [id number] Neck Size: 20.00 CLINICAL INFORMATION Sleep Study Type: Split Night CPAP Indication for sleep study: Fatigue, Obesity, OSA, Snoring, Witnessed Apneas Epworth Sleepiness Score: 11  SLEEP STUDY TECHNIQUE As per the AASM Manual for the Scoring of Sleep and Associated Events v2.3 (April 2016) with a hypopnea requiring 4% desaturations. The channels recorded and monitored were frontal, central and occipital EEG, electrooculogram (EOG), submentalis EMG (chin), nasal and oral airflow, thoracic and abdominal wall motion, anterior tibialis EMG, snore microphone, electrocardiogram, and pulse oximetry. Continuous positive airway pressure (CPAP) was initiated when the patient met split night criteria and was titrated according to treat sleep-disordered breathing.  MEDICATIONS Medications taken by the patient : charted for review Medications administered by patient during sleep study : Sleep medicine administered - Trazodone at 08:58:02 PM  RESPIRATORY PARAMETERS Diagnostic Total AHI (/hr): 21.7 RDI (/hr): 21.7 OA Index (/hr): 0.9 CA Index (/hr): 1.3 REM AHI (/hr): 40.0 NREM AHI (/hr): 20.9 Supine AHI (/hr): 22.0 Non-supine AHI (/hr): 20.40 Min O2 Sat (%): 66.00 Mean O2 (%): 92.84 Time below 88% (min): 18.4   Titration Optimal Pressure (cm): 14 AHI at Optimal Pressure (/hr): 0.0 Min O2 at Optimal Pressure (%): 93.0 Supine % at Optimal (%): 100 Sleep % at Optimal (%): 100    SLEEP ARCHITECTURE The recording time for the entire night was 361.9 minutes. During a baseline period of 146.6 minutes, the patient slept for 141.0 minutes in REM and nonREM, yielding a sleep efficiency of 96.2%. Sleep onset after lights out was 2.6  minutes with a REM latency of 50.0 minutes. The patient spent 2.13% of the night in stage N1 sleep, 90.78% in stage N2 sleep, 2.84% in stage N3 and 4.26% in REM. During the titration period of 209.1 minutes, the patient slept for 200.8 minutes in REM and nonREM, yielding a sleep efficiency of 96.0%. Sleep onset after CPAP initiation was 1.3 minutes with a REM latency of 47.5 minutes. The patient spent 1.99% of the night in stage N1 sleep, 65.99% in stage N2 sleep, 16.08% in stage N3 and 15.94% in REM.  CARDIAC DATA The 2 lead EKG demonstrated sinus rhythm. The mean heart rate was 71.38 beats per minute. Other EKG findings include: None.  LEG MOVEMENT DATA The total Periodic Limb Movements of Sleep (PLMS) were 187. The PLMS index was 32.83 .  IMPRESSIONS - Moderate obstructive sleep apnea occurred during the diagnostic portion of the study(AHI = 21.7/hour). An optimal PAP pressure was selected for this patient ( 14 cm of water) - No significant central sleep apnea occurred during the diagnostic portion of the study (CAI = 1.3/hour). - Moderate oxygen desaturation was noted during the diagnostic portion of the study (Min O2 =66.00%). - loud snoring was audible during the diagnostic portion of the study. - No cardiac abnormalities were noted during this study.eriodic limb movements of sleep occurred during the study. - Limb movement may resolve with treatment of apnea   DIAGNOSIS - Obstructive Sleep Apnea (327.23 [G47.33 ICD-10])  RECOMMENDATIONS - Trial of CPAP therapy on 14 cm H2O with a Medium size Resmed Full Face Mask AirFit F10 mask and heated humidification. - Avoid alcohol, sedatives and other CNS depressants that may worsen sleep apnea and disrupt normal sleep architecture. - Sleep hygiene should be reviewed to assess factors  that may improve sleep quality. - Weight management and regular exercise should be initiated or continued.  Deneise Lever Diplomate, American Board of Sleep  Medicine  ELECTRONICALLY SIGNED ON:  01/19/2016, 11:41 AM [place] PH: [phone]   FX: [phone] [place]

## 2016-01-24 ENCOUNTER — Other Ambulatory Visit: Payer: Self-pay | Admitting: Family Medicine

## 2016-02-06 ENCOUNTER — Ambulatory Visit: Payer: Medicaid Other | Admitting: Family Medicine

## 2016-02-23 ENCOUNTER — Other Ambulatory Visit: Payer: Self-pay | Admitting: Family Medicine

## 2016-03-04 ENCOUNTER — Ambulatory Visit: Payer: Medicaid Other | Admitting: Family Medicine

## 2016-03-05 ENCOUNTER — Telehealth: Payer: Self-pay | Admitting: Family Medicine

## 2016-03-05 NOTE — Telephone Encounter (Signed)
Please see paperwork in white team folder for you to fill out, thank you

## 2016-03-05 NOTE — Telephone Encounter (Signed)
Pt filled out wrong portion of form.  Called [place] and LM for her to call to ask if another copy of the SCAT eligibility form can be faxed to us. Sunday SpillersSharon T Saunders, CMA

## 2016-03-21 NOTE — Telephone Encounter (Signed)
Pt informed. Will come in Tuesday to fill form out. Deseree Bruna PotterBlount, CMA

## 2016-03-21 NOTE — Telephone Encounter (Signed)
LVM for pt to call . If pt calls, please have pt come and fill out a form for [employer]. Form put in envelope and put up front. Sunday SpillersSharon T Michaeljoseph Revolorio, CMA

## 2016-03-25 ENCOUNTER — Telehealth: Payer: Self-pay | Admitting: Family Medicine

## 2016-03-25 NOTE — Telephone Encounter (Signed)
Left voice message that form is complete and ready for pick up.  Mylinh Cragg L, RN  

## 2016-03-25 NOTE — Telephone Encounter (Signed)
Pt came by the office today and filled and signed the form for transportation. The form is now in the doctors box and needs to be finished filling out and either mailed or faxed to transportation. jw

## 2016-03-26 ENCOUNTER — Telehealth: Payer: Self-pay | Admitting: Family Medicine

## 2016-03-26 NOTE — Telephone Encounter (Signed)
Pt called and would like us to mail the SCAT papers to her home once they are filled out. The address in Epic is correct. jw

## 2016-03-26 NOTE — Telephone Encounter (Signed)
   °  Pt called and would like [place] to mail the SCAT papers to her home once they are filled out. The address in Epic is correct. jw

## 2016-03-28 NOTE — Telephone Encounter (Signed)
Forms placed to be mailed. Kylie SakaiZimmerman Garner, Kylie Garner, [place]

## 2016-04-14 ENCOUNTER — Ambulatory Visit: Payer: Medicaid Other | Admitting: Family Medicine

## 2016-05-14 ENCOUNTER — Encounter: Payer: Self-pay | Admitting: Family Medicine

## 2016-05-14 ENCOUNTER — Ambulatory Visit (INDEPENDENT_AMBULATORY_CARE_PROVIDER_SITE_OTHER): Payer: Medicaid Other | Admitting: Family Medicine

## 2016-05-14 VITALS — BP 105/47 | HR 80 | Temp 98.2°F | Wt 286.4 lb

## 2016-05-14 DIAGNOSIS — R7309 Other abnormal glucose: Secondary | ICD-10-CM

## 2016-05-14 DIAGNOSIS — J449 Chronic obstructive pulmonary disease, unspecified: Secondary | ICD-10-CM | POA: Diagnosis not present

## 2016-05-14 DIAGNOSIS — G4733 Obstructive sleep apnea (adult) (pediatric): Secondary | ICD-10-CM

## 2016-05-14 LAB — POCT GLYCOSYLATED HEMOGLOBIN (HGB A1C): HEMOGLOBIN A1C: 5.8

## 2016-05-14 NOTE — Patient Instructions (Signed)
I have placed the order for CPAP machine and home health.  They should call you and arrange a time to come out and help you with your machine.  Please plan to follow up with me in 1 month for your full physical exam and fasting labs.

## 2016-05-14 NOTE — Progress Notes (Signed)
    Subjective: CC: OSA HPI: Kylie Garner is a 55 y.o. female presenting to clinic today for follow up. Concerns today include:  1. OSA Reports poor sleep over the last few days.  She notes that she is not able to fall asleep.  She notes that Trazodone but that she needs to take 2 tablets to knock her out.  She reports excessive sedation after this.  She only did it one time.  Endorses snoring, poor energy.  Denies increased appetite.  She had a sleep study in 12/2015 which revealed OSA and CPAP was recommended.  She was unable to come back for follow up but is ready to have CPAP trial now.  Social History Reviewed: active smoker. 1 cigarette every 4 days. FamHx and MedHx reviewed.  Please see EMR. Health Maintenance: Annual physical  ROS: Per HPI  Objective: Office vital signs reviewed. BP 105/47 mmHg  Pulse 80  Temp(Src) 98.2 F (36.8 C) (Oral)  Wt 286 lb 6.4 oz (129.91 kg)  SpO2 94%  Physical Examination:  General: Awake, alert, morbidly obese, No acute distress HEENT: Normal    Neck: No masses palpated. No lymphadenopathy, large girth Cardio: regular rate and rhythm, S1S2 heard, no murmurs appreciated Pulm: scattered wheeze throughout.  Good air movement.  Normal WOB on room air.  Recent Results (from the past 2160 hour(s))  HgB A1c     Status: None   Collection Time: 05/14/16  3:45 PM  Result Value Ref Range   Hemoglobin A1C 5.8     Assessment/ Plan: 55 y.o. female   1. Obstructive sleep apnea.  Continues to have symptoms.  Has not ever used a CPAP and would like instruction on this. - [facility]. - Per sleep medicine recommendations: Trial of CPAP therapy on 14 cm H2O with a Medium size Resmed Full Face Mask AirFit F10 mask and heated humidification. - For home use only DME continuous positive airway pressure (CPAP) - Ambulatory referral to Home Health  2. Other abnormal glucose - HgB A1c - Recommended continued weight loss/ diet/  exercise modifications  3. COPD, wheeze on exam.   - Recommended smoking cessation - Use albuterol prn wheeze - if develops productive cough, fever would recommend treatment for exacerbation.  Patient to schedule full physical exam with fasting labs within next month.   Raliegh IpAshly M Sharmin Foulk, DO PGY-3, [facility]

## 2016-05-30 ENCOUNTER — Telehealth: Payer: Self-pay | Admitting: Family Medicine

## 2016-05-30 NOTE — Telephone Encounter (Signed)
Pt needs a referral to [facility] for a colonoscopy. Please advise. Thanks! ep °

## 2016-06-02 ENCOUNTER — Encounter: Payer: Self-pay | Admitting: Internal Medicine

## 2016-06-02 ENCOUNTER — Other Ambulatory Visit: Payer: Self-pay | Admitting: Family Medicine

## 2016-06-02 DIAGNOSIS — Z1211 Encounter for screening for malignant neoplasm of colon: Secondary | ICD-10-CM

## 2016-06-02 NOTE — Progress Notes (Signed)
Referral for colonoscopy placed.  Please let patient know.  Ashly M. Nadine CountsGottschalk, DO PGY-3, [facility]

## 2016-06-02 NOTE — Telephone Encounter (Signed)
Done.  Please advise patient.  

## 2016-06-03 NOTE — Telephone Encounter (Signed)
Pt informed. Leighton Brickley T Novah Nessel, CMA  

## 2016-06-16 ENCOUNTER — Encounter: Payer: Self-pay | Admitting: Family Medicine

## 2016-06-16 ENCOUNTER — Other Ambulatory Visit: Payer: Medicaid Other

## 2016-06-16 ENCOUNTER — Other Ambulatory Visit (HOSPITAL_COMMUNITY)
Admission: RE | Admit: 2016-06-16 | Discharge: 2016-06-16 | Disposition: A | Discharge status: Home / Self Care | Payer: Medicaid Other | Source: Ambulatory Visit | Attending: Family Medicine | Admitting: Family Medicine

## 2016-06-16 ENCOUNTER — Ambulatory Visit (INDEPENDENT_AMBULATORY_CARE_PROVIDER_SITE_OTHER): Payer: Medicaid Other | Admitting: Family Medicine

## 2016-06-16 VITALS — BP 116/58 | HR 78 | Temp 98.4°F | Wt 281.2 lb

## 2016-06-16 DIAGNOSIS — L304 Erythema intertrigo: Secondary | ICD-10-CM

## 2016-06-16 DIAGNOSIS — Z0000 Encounter for general adult medical examination without abnormal findings: Secondary | ICD-10-CM | POA: Diagnosis not present

## 2016-06-16 DIAGNOSIS — F17200 Nicotine dependence, unspecified, uncomplicated: Secondary | ICD-10-CM

## 2016-06-16 DIAGNOSIS — Z113 Encounter for screening for infections with a predominantly sexual mode of transmission: Secondary | ICD-10-CM | POA: Diagnosis present

## 2016-06-16 DIAGNOSIS — Z1151 Encounter for screening for human papillomavirus (HPV): Secondary | ICD-10-CM | POA: Insufficient documentation

## 2016-06-16 DIAGNOSIS — Z1159 Encounter for screening for other viral diseases: Secondary | ICD-10-CM

## 2016-06-16 DIAGNOSIS — N760 Acute vaginitis: Secondary | ICD-10-CM | POA: Insufficient documentation

## 2016-06-16 DIAGNOSIS — Z01419 Encounter for gynecological examination (general) (routine) without abnormal findings: Secondary | ICD-10-CM | POA: Insufficient documentation

## 2016-06-16 DIAGNOSIS — Z114 Encounter for screening for human immunodeficiency virus [HIV]: Secondary | ICD-10-CM

## 2016-06-16 DIAGNOSIS — Z124 Encounter for screening for malignant neoplasm of cervix: Secondary | ICD-10-CM

## 2016-06-16 LAB — LIPID PANEL
CHOL/HDL RATIO: 5.9 ratio — AB (ref <=5.0)
CHOLESTEROL: 200 mg/dL (ref 125–200)
HDL: 34 mg/dL — AB (ref >=46)
LDL Cholesterol: 122 mg/dL (ref <130)
Triglycerides: 219 mg/dL — ABNORMAL HIGH (ref <150)
VLDL: 44 mg/dL — AB (ref <30)

## 2016-06-16 LAB — BASIC METABOLIC PANEL WITH GFR
BUN: 11 mg/dL (ref 7–25)
CALCIUM: 9 mg/dL (ref 8.6–10.4)
CO2: 26 mmol/L (ref 20–31)
CREATININE: 0.84 mg/dL (ref 0.50–1.05)
Chloride: 105 mmol/L (ref 98–110)
GFR, Est African American: >89 mL/min (ref >=60)
GFR, Est Non African American: 80 mL/min (ref >=60)
GLUCOSE: 109 mg/dL — AB (ref 65–99)
Potassium: 3.9 mmol/L (ref 3.5–5.3)
SODIUM: 141 mmol/L (ref 135–146)

## 2016-06-16 LAB — TSH: TSH: 3.21 mIU/L

## 2016-06-16 LAB — HIV ANTIBODY (ROUTINE TESTING W REFLEX): HIV 1&2 Ab, 4th Generation: NONREACTIVE

## 2016-06-16 LAB — HEPATITIS C ANTIBODY: HCV Ab: NEGATIVE

## 2016-06-16 MED ORDER — NYSTATIN 100000 UNIT/GM EX POWD: Freq: Four times a day (QID) | CUTANEOUS | 0 refills | Status: DC

## 2016-06-16 MED ORDER — NYSTATIN 100000 UNIT/GM EX POWD: Freq: Two times a day (BID) | CUTANEOUS | Status: DC

## 2016-06-16 NOTE — Assessment & Plan Note (Signed)
Takes wellbutrin, using Nicotine patches

## 2016-06-16 NOTE — Progress Notes (Signed)
Kylie Garner is a 55 y.o. female presents to office today for annual physical exam examination.  Concerns today include:  Intertrigo: She notes itchy rash under her belly that is worse in the summer with sweating.  Tries to keep it dry with gold bond.  Last eye exam: >1 year ago Last dental exam: >1 year Last colonoscopy: October 11 Last mammogram: >1 year Last pap smear: 2014, normal.  No h/o abnormal Immunizations needed: TDap, PNA Refills needed today: none  Past Medical History:  Diagnosis Date  . Anxiety   . COPD (chronic obstructive pulmonary disease) (HCC)   . Depression   . Hypertension    Social History   Social History  . Marital status: Single    Spouse name: N/A  . Number of children: N/A  . Years of education: N/A   Occupational History  . Not on file.   Social History Main Topics  . Smoking status: Former Smoker    Packs/day: 0.10    Types: Cigarettes    Start date: 10/27/1976  . Smokeless tobacco: Not on file     Comment: smokes 1 cig per day up to 1 PPD when stressed. Set quit date for 12/03/15  . Alcohol use 0.0 oz/week  . Drug use: No  . Sexual activity: Not on file   Other Topics Concern  . Not on file   Social History Narrative  . No narrative on file   Past Surgical History:  Procedure Laterality Date  . CESAREAN SECTION  1993  . KNEE CARTILAGE SURGERY Left    Family History  Problem Relation Age of Onset  . Heart disease Father   . Diabetes Father     ROS: Review of Systems Constitutional: negative Eyes: positive for contacts/glasses Ears, nose, mouth, throat, and face: negative Respiratory: positive for COPD Cardiovascular: negative Gastrointestinal: negative Genitourinary:positive for decreased frequency of menstrual cycles (Q4 months spotting); perimenopausal Integument/breast: negative Hematologic/lymphatic: negative Musculoskeletal:positive for occ left knee pain Neurological: negative Behavioral/Psych: positive for  anxiety and sleep disturbance Endocrine: negative Allergic/Immunologic: negative   Physical exam BP (!) 116/58 (BP Location: Left Arm, Patient Position: Sitting, Cuff Size: Large)   Pulse 78   Temp 98.4 F (36.9 C) (Oral)   Wt 281 lb 3.2 oz (127.6 kg)   SpO2 95%   BMI 53.13 kg/m  General appearance: alert, cooperative, appears stated age and morbidly obese Head: Normocephalic, without obvious abnormality, atraumatic Eyes: negative findings: lids and lashes normal, conjunctivae and sclerae normal, corneas clear and pupils equal, round, reactive to light and accomodation Ears: normal external ear Nose: Nares normal. Septum midline. Mucosa normal. No drainage or sinus tenderness. Throat: lips, mucosa, and tongue normal; teeth and gums normal Neck: no adenopathy, supple, symmetrical, trachea midline and thyroid not enlarged, symmetric, no tenderness/mass/nodules Back: symmetric, no curvature. ROM normal. No CVA tenderness. Lungs: clear to auscultation bilaterally and normal WOB on room air Heart: regular rate and rhythm, S1, S2 normal, no murmur, click, rub or gallop Abdomen: obese, soft, NT/ ND, +BS Pelvic: external genitalia normal, no adnexal masses or tenderness, no cervical motion tenderness, rectovaginal septum normal, vagina normal without discharge and cervix difficult to visualize secondary to vaginal wall tissue, cervix is posterior facing. fibroid uterus Extremities: edema 1+ to mid shins bilaterally Pulses: 2+ and symmetric Skin: Skin color, texture, turgor normal. No rashes or lesions Lymph nodes: Cervical, supraclavicular, and axillary nodes normal. Neurologic: Alert and oriented X 3, normal strength and tone. Normal symmetric reflexes. Normal coordination  and gait   [facility] 06/16/2016 12/03/2015 12/29/2014  Decreased Interest 1 0 1  Down, Depressed, Hopeless 2 1 1   PHQ - 2 Score 3 1 2   Altered sleeping 1 - -  Tired, decreased energy 2 - -  Change in  appetite 1 - -  Feeling bad or failure about yourself  1 - -  Trouble concentrating 2 - -  Moving slowly or fidgety/restless 1 - -  Suicidal thoughts 0 - -  PHQ-9 Score 11 - -    GAD 7 : Generalized Anxiety Score 06/16/2016  Nervous, Anxious, on Edge 0  Control/stop worrying 0  Worry too much - different things 0  Trouble relaxing 0  Restless 0  Easily annoyed or irritable 0  Afraid - awful might happen 0  Total GAD 7 Score 0  Anxiety Difficulty Not difficult at all    Assessment/ Plan: Kylie Garner here for annual physical exam.   1. Well woman exam with routine gynecological exam - Pap performed today - Fasting labs as below  Morbid obesity Patient working on weight loss.  Encouraged decreased calorically dense foods.  To consider referral to Dr Gerilyn PilgrimSykes. - BMP - Lipid - TSH - VIt D  Tobacco use disorder Takes wellbutrin, using Nicotine patches  4. Need for hepatitis C screening test - Hepatitis C antibody  5. Screening for HIV (human immunodeficiency virus) - HIV antibody  6. Screening for cervical cancer - GC/CT/Trich added - Cytology - PAP w/ HPV cotesting - Will contact with results  8. Intertrigo - Discussed weight loss and keeping area dry - nystatin (MYCOSTATIN/NYSTOP) topical powder; Apply topically 2 (two) times daily.   Crysta Gulick M. Nadine CountsGottschalk, DO PGY-3, [facility]

## 2016-06-16 NOTE — Assessment & Plan Note (Addendum)
Patient working on weight loss.  Encouraged decreased calorically dense foods.  To consider referral to Dr Gerilyn PilgrimSykes. - BMP - Lipid - TSH - VIt D

## 2016-06-16 NOTE — Patient Instructions (Signed)
I will contact you will the results of your labs.  If anything is abnormal, I will call you.  Otherwise, expect a copy to be mailed to you. Health Maintenance, Female Adopting a healthy lifestyle and getting preventive care can go a long way to promote health and wellness. Talk with your health care provider about what schedule of regular examinations is right for you. This is a good chance for you to check in with your provider about disease prevention and staying healthy. In between checkups, there are plenty of things you can do on your own. Experts have done a lot of research about which lifestyle changes and preventive measures are most likely to keep you healthy. Ask your health care provider for more information. WEIGHT AND DIET  Eat a healthy diet  Be sure to include plenty of vegetables, fruits, low-fat dairy products, and lean protein.  Do not eat a lot of foods high in solid fats, added sugars, or salt.  Get regular exercise. This is one of the most important things you can do for your health.  Most adults should exercise for at least 150 minutes each week. The exercise should increase your heart rate and make you sweat (moderate-intensity exercise).  Most adults should also do strengthening exercises at least twice a week. This is in addition to the moderate-intensity exercise.  Maintain a healthy weight  Body mass index (BMI) is a measurement that can be used to identify possible weight problems. It estimates body fat based on height and weight. Your health care provider can help determine your BMI and help you achieve or maintain a healthy weight.  For females 71 years of age and older:   A BMI below 18.5 is considered underweight.  A BMI of 18.5 to 24.9 is normal.  A BMI of 25 to 29.9 is considered overweight.  A BMI of 30 and above is considered obese.  Watch levels of cholesterol and blood lipids  You should start having your blood tested for lipids and cholesterol  at 54 years of age, then have this test every 5 years.  You may need to have your cholesterol levels checked more often if:  Your lipid or cholesterol levels are high.  You are older than 54 years of age.  You are at high risk for heart disease.  CANCER SCREENING   Lung Cancer  Lung cancer screening is recommended for adults 55-81 years old who are at high risk for lung cancer because of a history of smoking.  A yearly low-dose CT scan of the lungs is recommended for people who:  Currently smoke.  Have quit within the past 15 years.  Have at least a 30-pack-year history of smoking. A pack year is smoking an average of one pack of cigarettes a day for 1 year.  Yearly screening should continue until it has been 15 years since you quit.  Yearly screening should stop if you develop a health problem that would prevent you from having lung cancer treatment.  Breast Cancer  Practice breast self-awareness. This means understanding how your breasts normally appear and feel.  It also means doing regular breast self-exams. Let your health care provider know about any changes, no matter how small.  If you are in your 20s or 30s, you should have a clinical breast exam (CBE) by a health care provider every 1-3 years as part of a regular health exam.  If you are 75 or older, have a CBE every year. Also consider  having a breast X-ray (mammogram) every year.  If you have a family history of breast cancer, talk to your health care provider about genetic screening.  If you are at high risk for breast cancer, talk to your health care provider about having an MRI and a mammogram every year.  Breast cancer gene (BRCA) assessment is recommended for women who have family members with BRCA-related cancers. BRCA-related cancers include:  Breast.  Ovarian.  Tubal.  Peritoneal cancers.  Results of the assessment will determine the need for genetic counseling and BRCA1 and BRCA2  testing. Cervical Cancer Your health care provider may recommend that you be screened regularly for cancer of the pelvic organs (ovaries, uterus, and vagina). This screening involves a pelvic examination, including checking for microscopic changes to the surface of your cervix (Pap test). You may be encouraged to have this screening done every 3 years, beginning at age 39.  For women ages 2-65, health care providers may recommend pelvic exams and Pap testing every 3 years, or they may recommend the Pap and pelvic exam, combined with testing for human papilloma virus (HPV), every 5 years. Some types of HPV increase your risk of cervical cancer. Testing for HPV may also be done on women of any age with unclear Pap test results.  Other health care providers may not recommend any screening for nonpregnant women who are considered low risk for pelvic cancer and who do not have symptoms. Ask your health care provider if a screening pelvic exam is right for you.  If you have had past treatment for cervical cancer or a condition that could lead to cancer, you need Pap tests and screening for cancer for at least 20 years after your treatment. If Pap tests have been discontinued, your risk factors (such as having a new sexual partner) need to be reassessed to determine if screening should resume. Some women have medical problems that increase the chance of getting cervical cancer. In these cases, your health care provider may recommend more frequent screening and Pap tests. Colorectal Cancer  This type of cancer can be detected and often prevented.  Routine colorectal cancer screening usually begins at 54 years of age and continues through 54 years of age.  Your health care provider may recommend screening at an earlier age if you have risk factors for colon cancer.  Your health care provider may also recommend using home test kits to check for hidden blood in the stool.  A small camera at the end of a  tube can be used to examine your colon directly (sigmoidoscopy or colonoscopy). This is done to check for the earliest forms of colorectal cancer.  Routine screening usually begins at age 73.  Direct examination of the colon should be repeated every 5-10 years through 55 years of age. However, you may need to be screened more often if early forms of precancerous polyps or small growths are found. Skin Cancer  Check your skin from head to toe regularly.  Tell your health care provider about any new moles or changes in moles, especially if there is a change in a mole's shape or color.  Also tell your health care provider if you have a mole that is larger than the size of a pencil eraser.  Always use sunscreen. Apply sunscreen liberally and repeatedly throughout the day.  Protect yourself by wearing long sleeves, pants, a wide-brimmed hat, and sunglasses whenever you are outside. HEART DISEASE, DIABETES, AND HIGH BLOOD PRESSURE   High blood  pressure causes heart disease and increases the risk of stroke. High blood pressure is more likely to develop in:  People who have blood pressure in the high end of the normal range (130-139/85-89 mm Hg).  People who are overweight or obese.  People who are African American.  If you are 48-67 years of age, have your blood pressure checked every 3-5 years. If you are 48 years of age or older, have your blood pressure checked every year. You should have your blood pressure measured twice--once when you are at a hospital or clinic, and once when you are not at a hospital or clinic. Record the average of the two measurements. To check your blood pressure when you are not at a hospital or clinic, you can use:  An automated blood pressure machine at a pharmacy.  A home blood pressure monitor.  If you are between 85 years and 8 years old, ask your health care provider if you should take aspirin to prevent strokes.  Have regular diabetes screenings. This  involves taking a blood sample to check your fasting blood sugar level.  If you are at a normal weight and have a low risk for diabetes, have this test once every three years after 54 years of age.  If you are overweight and have a high risk for diabetes, consider being tested at a younger age or more often. PREVENTING INFECTION  Hepatitis B  If you have a higher risk for hepatitis B, you should be screened for this virus. You are considered at high risk for hepatitis B if:  You were born in a country where hepatitis B is common. Ask your health care provider which countries are considered high risk.  Your parents were born in a high-risk country, and you have not been immunized against hepatitis B (hepatitis B vaccine).  You have HIV or AIDS.  You use needles to inject street drugs.  You live with someone who has hepatitis B.  You have had sex with someone who has hepatitis B.  You get hemodialysis treatment.  You take certain medicines for conditions, including cancer, organ transplantation, and autoimmune conditions. Hepatitis C  Blood testing is recommended for:  Everyone born from [birth year] through 1964.  Anyone with known risk factors for hepatitis C. Sexually transmitted infections (STIs)  You should be screened for sexually transmitted infections (STIs) including gonorrhea and chlamydia if:  You are sexually active and are younger than 54 years of age.  You are older than 54 years of age and your health care provider tells you that you are at risk for this type of infection.  Your sexual activity has changed since you were last screened and you are at an increased risk for chlamydia or gonorrhea. Ask your health care provider if you are at risk.  If you do not have HIV, but are at risk, it may be recommended that you take a prescription medicine daily to prevent HIV infection. This is called pre-exposure prophylaxis (PrEP). You are considered at risk if:  You are  sexually active and do not regularly use condoms or know the HIV status of your partner(s).  You take drugs by injection.  You are sexually active with a partner who has HIV. Talk with your health care provider about whether you are at high risk of being infected with HIV. If you choose to begin PrEP, you should first be tested for HIV. You should then be tested every 3 months for as long as  you are taking PrEP.  PREGNANCY   If you are premenopausal and you may become pregnant, ask your health care provider about preconception counseling.  If you may become pregnant, take 400 to 800 micrograms (mcg) of folic acid every day.  If you want to prevent pregnancy, talk to your health care provider about birth control (contraception). OSTEOPOROSIS AND MENOPAUSE   Osteoporosis is a disease in which the bones lose minerals and strength with aging. This can result in serious bone fractures. Your risk for osteoporosis can be identified using a bone density scan.  If you are 63 years of age or older, or if you are at risk for osteoporosis and fractures, ask your health care provider if you should be screened.  Ask your health care provider whether you should take a calcium or vitamin D supplement to lower your risk for osteoporosis.  Menopause may have certain physical symptoms and risks.  Hormone replacement therapy may reduce some of these symptoms and risks. Talk to your health care provider about whether hormone replacement therapy is right for you.  HOME CARE INSTRUCTIONS   Schedule regular health, dental, and eye exams.  Stay current with your immunizations.   Do not use any tobacco products including cigarettes, chewing tobacco, or electronic cigarettes.  If you are pregnant, do not drink alcohol.  If you are breastfeeding, limit how much and how often you drink alcohol.  Limit alcohol intake to no more than 1 drink per day for nonpregnant women. One drink equals 12 ounces of beer, 5  ounces of wine, or 1 ounces of hard liquor.  Do not use street drugs.  Do not share needles.  Ask your health care provider for help if you need support or information about quitting drugs.  Tell your health care provider if you often feel depressed.  Tell your health care provider if you have ever been abused or do not feel safe at home.   This information is not intended to replace advice given to you by your health care provider. Make sure you discuss any questions you have with your health care provider.   Document Released: 04/28/2011 Document Revised: 11/03/2014 Document Reviewed: 09/14/2013 Elsevier Interactive Patient Education [employer].

## 2016-06-16 NOTE — Addendum Note (Signed)
Addended by: Raliegh IpGOTTSCHALK, Micaila Ziemba M on: 06/16/2016 10:09 AM   Modules accepted: Orders

## 2016-06-17 ENCOUNTER — Encounter: Payer: Self-pay | Admitting: Family Medicine

## 2016-06-17 ENCOUNTER — Telehealth: Payer: Self-pay | Admitting: Family Medicine

## 2016-06-17 LAB — CYTOLOGY - PAP

## 2016-06-17 LAB — VITAMIN D 25 HYDROXY (VIT D DEFICIENCY, FRACTURES): VIT D 25 HYDROXY: 25 ng/mL — AB (ref 30–100)

## 2016-06-17 NOTE — Telephone Encounter (Signed)
Called to discuss labs.  No answer.  NO voicemail.  Cholesterol a little high but does not require medication.  I recommend that she reduce her carbohydrate intake and saturated fats.  Increase exercise to at least 35 minutes daily.  Kidney function normal.  Electyrolytes normal.  Hep C negative.  HIV negative.  Thyroid stimulating hormone normal.  Vitamin D somewhat low.  She should take an over the counter multivitamin or vitamin D supplement daily.  Her pap smear was normal.  No STDs on exam.  Please relay information to patient if she calls back.  Ashly M. Nadine CountsGottschalk, DO PGY-3, [facility]

## 2016-08-06 ENCOUNTER — Ambulatory Visit: Payer: Medicaid Other | Admitting: Internal Medicine

## 2016-08-29 ENCOUNTER — Ambulatory Visit (INDEPENDENT_AMBULATORY_CARE_PROVIDER_SITE_OTHER): Payer: Medicaid Other | Admitting: *Deleted

## 2016-08-29 DIAGNOSIS — Z23 Encounter for immunization: Secondary | ICD-10-CM

## 2016-09-11 ENCOUNTER — Ambulatory Visit (INDEPENDENT_AMBULATORY_CARE_PROVIDER_SITE_OTHER): Payer: Medicaid Other

## 2016-09-11 ENCOUNTER — Ambulatory Visit (INDEPENDENT_AMBULATORY_CARE_PROVIDER_SITE_OTHER): Payer: Medicaid Other | Admitting: Physician Assistant

## 2016-09-11 DIAGNOSIS — M25561 Pain in right knee: Secondary | ICD-10-CM

## 2016-09-11 DIAGNOSIS — M25562 Pain in left knee: Secondary | ICD-10-CM

## 2016-09-11 MED ORDER — LIDOCAINE HCL 1 % IJ SOLN
3.0000 mL | INTRAMUSCULAR | Status: AC | PRN
  Administered 2016-09-11: 3 mL

## 2016-09-11 MED ORDER — METHYLPREDNISOLONE ACETATE 40 MG/ML IJ SUSP
40.0000 mg | INTRAMUSCULAR | Status: AC | PRN
  Administered 2016-09-11: 40 mg via INTRA_ARTICULAR

## 2016-09-11 NOTE — Progress Notes (Signed)
Office Visit Note   Patient: Kylie Garner           Date of Birth: 01/22/1961           MRN: [id number] Visit Date: 09/11/2016              Requested by: Raliegh IpAshly M Gottschalk, DO [address] [place], [place] [id number] PCP: Delynn FlavinAshly Gottschalk, DO   Assessment & Plan: Visit Diagnoses:  1. Acute pain of right knee     Plan:Work on range of motion. Ice and elevation.  Follow-Up Instructions: Return in about 2 weeks (around 09/25/2016).   Orders:  Orders Placed This Encounter  Procedures  . Large Joint Injection/Arthrocentesis  . XR Knee 1-2 Views Right   No orders of the defined types were placed in this encounter.     Procedures: Large Joint Inj Date/Time: 09/11/2016 8:46 AM Performed by: Kirtland BouchardLARK, Koah Chisenhall W Authorized by: Kirtland BouchardLARK, Ellianna Ruest W   Consent Given by:  Patient Indications:  Pain Location:  Knee Site:  R knee Needle Size:  22 G Approach:  Anterolateral Ultrasound Guidance: No   Fluoroscopic Guidance: No   Medications:  40 mg methylPREDNISolone acetate 40 MG/ML; 3 mL lidocaine 1 % Aspiration Attempted: No   Patient tolerance:  Patient tolerated the procedure well with no immediate complications     Clinical Data: No additional findings.   Subjective: Chief Complaint  Patient presents with  . Right Knee - Pain    Patient states past couple months knee has really been bothering her. States yesterday going upstairs knee completley gave out. Ambulates with rolling walker.     55 year old female well known by Dr. Eliberto IvoryBlackman's service has known tricompartmental arthritis of the right knee. The knee was doing well until last night when she unfortunately had a fall. She apparently was taking out her trash and her right knee gave way on her causing her to fall. She had no loss consciousness, dizziness or chest pain. She denies any other injuries outside of the right knee.    Review of Systems See HPI  Objective: Vital Signs: There were no vitals taken  for this visit.  Physical Exam  Constitutional: She is oriented to person, place, and time. She appears well-developed and well-nourished. No distress.  Neurological: She is alert and oriented to person, place, and time.  Psychiatric: She has a normal mood and affect.    Ortho Exam Right knee she has global tenderness. Full extension flexion to 80. Knee attempts at further flexion causes significant pain. Valgus varus stressing reveals no laxity. No apparent effusion. No erythema, rashes or skin lesions. Right calf supple nontender. Specialty Comments:  No specialty comments available.  Imaging: Xr Knee 1-2 Views Right  Result Date: 09/11/2016 AP and lateral views right knee: No acute fracture. Knee well located. Tricompartmental changes with moderate to severe medial compartment changes. No other bony abnormalities noted.    PMFS History: Patient Active Problem List   Diagnosis Date Noted  . COPD (chronic obstructive pulmonary disease) (HCC) 11/06/2014  . Depression 11/06/2014  . Difficulty sleeping 11/06/2014  . Morbid obesity (HCC) 11/06/2014  . H/O impaired glucose tolerance 11/06/2014  . Tobacco use disorder 11/06/2014  . Venous stasis dermatitis of both lower extremities 11/06/2014   Past Medical History:  Diagnosis Date  . Anxiety   . COPD (chronic obstructive pulmonary disease) (HCC)   . Depression   . Hypertension     Family History  Problem Relation Age of Onset  .  Heart disease Father   . Diabetes Father     Past Surgical History:  Procedure Laterality Date  . CESAREAN SECTION  1993  . KNEE CARTILAGE SURGERY Left    Social History   Occupational History  . Not on file.   Social History Main Topics  . Smoking status: Former Smoker    Packs/day: 0.10    Types: Cigarettes    Start date: 10/27/1976  . Smokeless tobacco: Never Used     Comment: smokes 1 cig per day up to 1 PPD when stressed. Set quit date for 12/03/15  . Alcohol use 0.0 oz/week  . Drug  use: No  . Sexual activity: Not on file

## 2016-09-15 ENCOUNTER — Encounter: Payer: Self-pay | Admitting: Family Medicine

## 2016-09-15 ENCOUNTER — Ambulatory Visit (INDEPENDENT_AMBULATORY_CARE_PROVIDER_SITE_OTHER): Payer: Medicaid Other | Admitting: Family Medicine

## 2016-09-15 DIAGNOSIS — G8929 Other chronic pain: Secondary | ICD-10-CM | POA: Diagnosis not present

## 2016-09-15 DIAGNOSIS — M25561 Pain in right knee: Secondary | ICD-10-CM | POA: Diagnosis not present

## 2016-09-15 HISTORY — DX: Pain in right knee: M25.561

## 2016-09-15 NOTE — Assessment & Plan Note (Signed)
Patient's exam notable for R knee guarding and 4/5 extensor strength.  No neurologic deficits.  She is ambulatory but is describing what sounds like meniscal injury.  Her 08/2016 knee xray report was reviewed which revealed moderate/severe tricompartmental changes.  She is currently seeing Dr Magnus IvanBlackman, ortho, for knee injections.  I am unsure as to whether she requires a wheelchair but suspect that she would benefit from formal PT and possibly a walker with a seat.  Will refer to [facility] to assess.

## 2016-09-15 NOTE — Progress Notes (Addendum)
    Subjective: CC: review lab/ pap and right knee weakness (mobility evaluation) HPI: Kylie Garner is a 55 y.o. female presenting to clinic today for follow up visit. Concerns today include:  Right knee pain/instability Patient reports right knee weakness.  She notes she had a shot with Dr Magnus IvanBlackman, ortho, yesterday.  She had xrays done that showed moderate/ severe tricompartmental changes.  She is wondering if she needs a wheelchair.  She notes that she has a walker that does not have a seat.  She notes that if she is not able to rest while walking, she feels like the knee buckles.  She notes that she does not see PT but has a home exercise program that was prescribed by ortho.  She notes that she physically is unable to sustain this.  She is scheduled to see ortho again in 2 weeks for repeat knee injection.  Additionally, patient has 6/10 pain now but as high as 10/10 pain with prolonged ambulation that is unsupported, resulting in knee buckling.  She has a history of bilateral knee arthroscopy (LLE 2015, RLE 2016).  Social History Reviewed. FamHx and MedHx reviewed.  Please see EMR. Health Maintenance: patient to schedule mammogram after holidays  ROS: Per HPI  Objective: Office vital signs reviewed. BP 111/68 (BP Location: Left Wrist, Patient Position: Sitting, Cuff Size: Normal)   Pulse 81   Temp 98.1 F (36.7 C) (Oral)   Ht 5' (1.524 m)   Wt 277 lb 12.8 oz (126 kg)   SpO2 97%   BMI 54.25 kg/m   Physical Examination:  General: Awake, alert, obese, No acute distress Extremities: warm, well perfused, No edema, cyanosis or clubbing; +2 pulses bilaterally MSK: antalgic gait and normal station  RLE: no effusion appreciated. + TTP to lateral aspect of knee. No erythema. 4/5 RLE extensor strength.  Neuro: light touch sensation grossly intact, patellar DTRs 2/4  Assessment/ Plan: 55 y.o. female   Right knee pain Patient's exam notable for R knee guarding and 4/5 extensor  strength.  No neurologic deficits.  She is ambulatory but is describing what sounds like meniscal injury.  Her 08/2016 knee xray report was reviewed which revealed moderate/severe tricompartmental changes.  She is currently seeing Dr Magnus IvanBlackman, ortho, for knee injections.  I am unsure as to whether she requires a wheelchair but suspect that she would benefit from formal PT and possibly a walker with a seat.  Will refer to [facility] to assess.    Lab results and pap smear results reviewed with patient.  Letter from 05/2016 also printed and provided to patient.  Orders Placed This Encounter  Procedures  . For home use only DME 4 wheeled rolling walker with seat ([id number])    Order Specific Question:   Patient needs a walker to treat with the following condition    Answer:   Weakness of lower extremity [[id number]][[id number]]    Follow up as needed.  Raliegh IpAshly M Serenitee Fuertes, DO PGY-3, [facility]

## 2016-09-15 NOTE — Addendum Note (Signed)
Addended by: Raliegh IpGOTTSCHALK, ASHLY M on: 09/15/2016 03:15 PM   Modules accepted: Orders

## 2016-09-25 ENCOUNTER — Ambulatory Visit (INDEPENDENT_AMBULATORY_CARE_PROVIDER_SITE_OTHER): Payer: Medicaid Other | Admitting: Orthopaedic Surgery

## 2016-10-08 ENCOUNTER — Ambulatory Visit: Payer: Medicaid Other | Admitting: Internal Medicine

## 2016-10-27 DIAGNOSIS — G4733 Obstructive sleep apnea (adult) (pediatric): Secondary | ICD-10-CM | POA: Diagnosis not present

## 2016-11-10 ENCOUNTER — Telehealth: Payer: Self-pay | Admitting: Family Medicine

## 2016-11-10 ENCOUNTER — Other Ambulatory Visit: Payer: Self-pay | Admitting: Family Medicine

## 2016-11-10 MED ORDER — NYSTATIN 100000 UNIT/GM EX POWD: Freq: Two times a day (BID) | CUTANEOUS | 1 refills | Status: DC

## 2016-11-10 NOTE — Telephone Encounter (Signed)
Pt is calling for a refill on her powder. She is using Nystatin. jw

## 2016-11-10 NOTE — Telephone Encounter (Signed)
done

## 2016-11-27 DIAGNOSIS — G4733 Obstructive sleep apnea (adult) (pediatric): Secondary | ICD-10-CM | POA: Diagnosis not present

## 2016-12-25 DIAGNOSIS — G4733 Obstructive sleep apnea (adult) (pediatric): Secondary | ICD-10-CM | POA: Diagnosis not present

## 2017-01-25 DIAGNOSIS — G4733 Obstructive sleep apnea (adult) (pediatric): Secondary | ICD-10-CM | POA: Diagnosis not present

## 2017-02-24 DIAGNOSIS — G4733 Obstructive sleep apnea (adult) (pediatric): Secondary | ICD-10-CM | POA: Diagnosis not present

## 2017-03-17 ENCOUNTER — Other Ambulatory Visit: Payer: Self-pay | Admitting: Family Medicine

## 2017-03-17 DIAGNOSIS — J449 Chronic obstructive pulmonary disease, unspecified: Secondary | ICD-10-CM

## 2017-03-31 ENCOUNTER — Ambulatory Visit: Payer: Medicaid Other | Admitting: Family Medicine

## 2017-06-08 ENCOUNTER — Telehealth: Payer: Self-pay | Admitting: Family Medicine

## 2017-06-08 NOTE — Telephone Encounter (Signed)
Pt needs a nebulizer machine. ep

## 2017-06-11 ENCOUNTER — Encounter: Payer: Self-pay | Admitting: Family Medicine

## 2017-06-11 ENCOUNTER — Ambulatory Visit (INDEPENDENT_AMBULATORY_CARE_PROVIDER_SITE_OTHER): Payer: Medicaid Other | Admitting: Family Medicine

## 2017-06-11 VITALS — BP 110/62 | HR 76 | Temp 98.3°F | Ht 60.0 in | Wt 288.0 lb

## 2017-06-11 DIAGNOSIS — Z87898 Personal history of other specified conditions: Secondary | ICD-10-CM

## 2017-06-11 DIAGNOSIS — J449 Chronic obstructive pulmonary disease, unspecified: Secondary | ICD-10-CM

## 2017-06-11 LAB — POCT GLYCOSYLATED HEMOGLOBIN (HGB A1C): Hemoglobin A1C: 5.8

## 2017-06-11 MED ORDER — ALBUTEROL SULFATE HFA 108 (90 BASE) MCG/ACT IN AERS: 2.0000 | INHALATION_SPRAY | Freq: Four times a day (QID) | RESPIRATORY_TRACT | 2 refills | Status: DC | PRN

## 2017-06-11 NOTE — Progress Notes (Signed)
   Subjective:    Patient ID: Kylie Garner, female    DOB: 04/26/1961, 56 y.o.   MRN: [id number]   CC: Shortness of breath  HPI: Patient is a 56 yo female with a past medical history significant for COPD who present today for worsening shortness of breath with ambulation for the past few weeks. Patient reports that she has been using her albuterol inhaler more often during the day when she exerts herself. Patient denies using albuterol at night and has been using her CPAP machine. Patient endorses cough. She does not have an nebulizer machine and would like to have one order for her. Patient denies any wheezing or lower extremities edema. Patient is still smoking about one cigarette a day. Patient has been able to quit in the past and is motivated to quit. She denies fever, chills and abdominal pain.  Smoking status reviewed   ROS: all other systems were reviewed and are negative other than in the HPI   Past Medical History:  Diagnosis Date  . Anxiety   . COPD (chronic obstructive pulmonary disease) (HCC)   . Depression   . Hypertension     Past Surgical History:  Procedure Laterality Date  . CESAREAN SECTION  1993  . KNEE CARTILAGE SURGERY Left     Past medical history, surgical, family, and social history reviewed and updated in the EMR as appropriate.  Objective:  BP 110/62   Pulse 76   Temp 98.3 F (36.8 C) (Oral)   Ht 5' (1.524 m)   Wt 288 lb (130.6 kg)   SpO2 94%   BMI 56.25 kg/m   Vitals and nursing note reviewed  General: Obese, NAD, pleasant, able to participate in exam Cardiac: RRR, normal heart sounds, no murmurs. 2+ radial and PT pulses bilaterally Respiratory: CTAB, normal effort, No wheezes, rales or rhonchi noted on exam.  Abdomen: soft, nontender, nondistended, no hepatic or splenomegaly, +BS Extremities: no edema or cyanosis. WWP. Skin: warm and dry, no rashes noted Neuro: alert and oriented x4, no focal deficits Psych: Normal affect and  mood   Assessment & Plan:   #Worsening shortness of breath, chronic Patient presents with worsening shortness of breath for the past few weeks. Patient is currently a smoker with history of COPD with PFTs last done in 2016 showed restrictive process and decrease FEV1. Patient was able to quit tobacco in the past but has relapse. Lung exam is unremarkable no wheezing or crackles noted. Patient is moving air bilaterally without any increase work of breathing. Worsening SOB likely secondary to COPD exacerbation in the setting of continued tobacco use. No concern for CHF, with no crackles and minimal LE edema.  Patient has been using CPAP at night but is also morbidly obese and has gained about 10 lbs since last OV in 2017.Definitely weight is a contributing factor in worsening SOB. --Will refer to Dr.Koval for smoking cessation and PFTs --Patient was given a Community education officerebulizer Machine in Clinic today --Albuterol inhaler was refilled --Discuss weight loss, patient would benefit of nutrition education --Patient will follow up with PCP after visit with Dr.Koval    Kylie NeighboursAbdoulaye Lorine Iannaccone, MD [facility] PGY-2

## 2017-06-11 NOTE — Patient Instructions (Addendum)
It was great seeing you today! We have addressed the following issues today  1. I want you to make an appointment at the front desk with Dr.Koval for smoking cessation and PFTs 2. I will provide you with a nebulizer machine and refill your albuterol. 3. As discussed continue to work on weight loss as it does contribute to some of this shortness of breath.   If we did any lab work today, and the results require attention, either me or my nurse will get in touch with you. If everything is normal, you will get a letter in mail and a message via . If you don't hear from us in two weeks, please give us a call. Otherwise, we look forward to seeing you again at your next visit. If you have any questions or concerns before then, please call the clinic at [phone].  Please bring all your medications to every doctors visit  Sign up for My Chart to have easy access to your labs results, and communication with your Primary care physician. Please ask Front Desk for some assistance.   Please check-out at the front desk before leaving the clinic.    Take Care,   Dr. Sydnee Cabaliallo   Chronic Obstructive Pulmonary Disease Exacerbation Chronic obstructive pulmonary disease (COPD) is a common lung problem. In COPD, the flow of air from the lungs is limited. COPD exacerbations are times that breathing gets worse and you need extra treatment. Without treatment they can be life threatening. If they happen often, your lungs can become more damaged. If your COPD gets worse, your doctor may treat you with:  Medicines.  Oxygen.  Different ways to clear your airway, such as using a mask.  Follow these instructions at home:  Do not smoke.  Avoid tobacco smoke and other things that bother your lungs.  If given, take your antibiotic medicine as told. Finish the medicine even if you start to feel better.  Only take medicines as told by your doctor.  Drink enough fluids to keep your pee (urine) clear or pale  yellow (unless your doctor has told you not to).  Use a cool mist machine (vaporizer).  If you use oxygen or a machine that turns liquid medicine into a mist (nebulizer), continue to use them as told.  Keep up with shots (vaccinations) as told by your doctor.  Exercise regularly.  Eat healthy foods.  Keep all doctor visits as told. Get help right away if:  You are very short of breath and it gets worse.  You have trouble talking.  You have bad chest pain.  You have blood in your spit (sputum).  You have a fever.  You keep throwing up (vomiting).  You feel weak, or you pass out (faint).  You feel confused.  You keep getting worse. This information is not intended to replace advice given to you by your health care provider. Make sure you discuss any questions you have with your health care provider. Document Released: 10/02/2011 Document Revised: 03/20/2016 Document Reviewed: 06/17/2013 Elsevier Interactive Patient Education  2017 [employer]

## 2017-06-15 ENCOUNTER — Telehealth: Payer: Self-pay | Admitting: *Deleted

## 2017-06-15 NOTE — Telephone Encounter (Signed)
Received faxed request from [employer] for signature on PA for Compressor/Nebulizer. Form placed in Provider's (Diallo) box. Kinnie Feil, RN, BSN

## 2017-06-16 ENCOUNTER — Encounter: Payer: Self-pay | Admitting: *Deleted

## 2017-06-16 NOTE — Progress Notes (Signed)
Letter mailed. Jazmin Hartsell,CMA  

## 2017-06-22 ENCOUNTER — Ambulatory Visit: Payer: Medicaid Other | Admitting: Pharmacist

## 2017-07-27 DIAGNOSIS — H5203 Hypermetropia, bilateral: Secondary | ICD-10-CM | POA: Diagnosis not present

## 2017-07-27 DIAGNOSIS — H5310 Unspecified subjective visual disturbances: Secondary | ICD-10-CM | POA: Diagnosis not present

## 2017-07-27 DIAGNOSIS — H2513 Age-related nuclear cataract, bilateral: Secondary | ICD-10-CM | POA: Diagnosis not present

## 2017-09-07 ENCOUNTER — Ambulatory Visit: Payer: Medicaid Other | Admitting: Family Medicine

## 2017-09-07 VITALS — BP 134/82 | HR 73 | Temp 98.5°F | Wt 290.0 lb

## 2017-09-07 DIAGNOSIS — M7989 Other specified soft tissue disorders: Secondary | ICD-10-CM | POA: Diagnosis present

## 2017-09-07 DIAGNOSIS — R600 Localized edema: Secondary | ICD-10-CM | POA: Diagnosis not present

## 2017-09-07 DIAGNOSIS — Z79899 Other long term (current) drug therapy: Secondary | ICD-10-CM | POA: Diagnosis not present

## 2017-09-07 DIAGNOSIS — Z23 Encounter for immunization: Secondary | ICD-10-CM

## 2017-09-07 HISTORY — DX: Localized edema: R60.0

## 2017-09-07 MED ORDER — FUROSEMIDE 40 MG PO TABS: 40.0000 mg | ORAL_TABLET | Freq: Every day | ORAL | 0 refills | Status: DC

## 2017-09-07 NOTE — Progress Notes (Signed)
   Subjective:   Patient ID: Kylie Garner    DOB: Jul 09, 1961, 56 y.o. female   MRN: [id number]  CC: leg swelling   HPI: Kylie Garner is a 56 y.o. female who presents to clinic today as a same-day visit.    Leg swelling Patient reports worsening leg swelling over the past 3 weeks.  Not associated with shortness of breath, chest pain, palpitations.  She reports symptoms are worse when she drinks small amounts of water causing painful swelling in feet and lower legs.  She states her baseline weight is usually below 275 pounds.  However today in clinic she weighs 290 pounds.  She has had chronic venous stasis of bilateral lower extremities.  She reports she tries to use compression stockings and keep her legs elevated at home.   ROS: Denies fever, chills, shortness of breath, nausea, vomiting.   PMFSH: Pertinent past medical, surgical, family, and social history were reviewed and updated as appropriate. Smoking status reviewed. Medications reviewed.  Objective:   BP 134/82   Pulse 73   Temp 98.5 F (36.9 C) (Oral)   Wt 290 lb (131.5 kg)   SpO2 93%   BMI 56.64 kg/m  Vitals and nursing note reviewed.  General: 56 year old female, obese, no acute distress HEENT: NCAT, moist mucous membranes, oropharynx is clear Neck: supple, nontender CV: RRR no MRG Lungs: Clear to auscultation bilaterally, no crackles, no wheeze Extremities: 1+ pitting edema bilaterally to mid shin, no calf tenderness or warmth, chronic skin changes Skin: warm, dry, no rash  Assessment & Plan:   Bilateral leg edema Patient with history of chronic venous stasis changes bilaterally in her lower extremities. Physical exam unremarkable with the exception of 1+ pitting edema bilaterally. However no crackles or other signs of volume overload.  Recommend keeping legs elevated at home and use of compression stockings.  Suspect her symptoms are due to venous stasis however will workup to rule out new CHF given no prior  echo and worsening edema. -Check BNP today -Restart her home Lasix of 40 mg daily-will reevaluate fluid status next week and adjust as necessary - CBC, BMP to kidney function  - PCP may consider echo depending on BNP and response to Lasix  Orders Placed This Encounter  Procedures  . Flu Vaccine QUAD 36+ mos IM  . Basic Metabolic Panel  . Brain natriuretic peptide  . CBC   Meds ordered this encounter  Medications  . furosemide (LASIX) 40 MG tablet    Sig: Take 1 tablet (40 mg total) daily by mouth.    Dispense:  30 tablet    Refill:  0   Follow-up: 1 week  Freddrick MarchYashika Lilia Letterman, MD [facility], PGY-2 09/07/2017 10:18 PM

## 2017-09-07 NOTE — Assessment & Plan Note (Signed)
Patient with history of chronic venous stasis changes bilaterally in her lower extremities. Physical exam unremarkable with the exception of 1+ pitting edema bilaterally. However no crackles or other signs of volume overload.  Recommend keeping legs elevated at home and use of compression stockings.  Suspect her symptoms are due to venous stasis however will workup to rule out new CHF given no prior echo and worsening edema. -Check BNP today -Restart her home Lasix of 40 mg daily-will reevaluate fluid status next week and adjust as necessary - CBC, BMP to kidney function  - PCP may consider echo depending on BNP and response to Lasix

## 2017-09-07 NOTE — Patient Instructions (Addendum)
It was nice meeting you today! You were seen in clinic for leg swelling which is likely due to your venous stasis however I have ordered some labs to rule out other causes as we discussed.  I will let you know of the results of this.  I have restarted your Lasix which should help take off some of the fluid.  Additionally, keep your legs elevated and use the compression stockings as much as you can.  Make sure you limit the amount of fluids you are taking in.  If your lab comes back elevated and further workup is warranted, Dr. Janee Mornhompson may choose to send you for an echo which we talked about looks at your heart.  You can return in 1-2 weeks to be reassessed.  If you have any questions please call clinic.   Be well, Freddrick MarchYashika Tuan Tippin, MD

## 2017-09-08 LAB — BASIC METABOLIC PANEL
BUN/Creatinine Ratio: 11 (ref 9–23)
BUN: 8 mg/dL (ref 6–24)
CALCIUM: 9 mg/dL (ref 8.7–10.2)
CHLORIDE: 101 mmol/L (ref 96–106)
CO2: 23 mmol/L (ref 20–29)
CREATININE: 0.74 mg/dL (ref 0.57–1.00)
GFR, EST AFRICAN AMERICAN: 106 mL/min/{1.73_m2} (ref >59)
GFR, EST NON AFRICAN AMERICAN: 92 mL/min/{1.73_m2} (ref >59)
Glucose: 95 mg/dL (ref 65–99)
Potassium: 4.4 mmol/L (ref 3.5–5.2)
Sodium: 142 mmol/L (ref 134–144)

## 2017-09-08 LAB — BRAIN NATRIURETIC PEPTIDE: BNP: 15.4 pg/mL (ref 0.0–100.0)

## 2017-09-08 LAB — CBC
HEMATOCRIT: 40.1 % (ref 34.0–46.6)
HEMOGLOBIN: 13.2 g/dL (ref 11.1–15.9)
MCH: 26.9 pg (ref 26.6–33.0)
MCHC: 32.9 g/dL (ref 31.5–35.7)
MCV: 82 fL (ref 79–97)
PLATELETS: 249 10*3/uL (ref 150–379)
RBC: 4.9 x10E6/uL (ref 3.77–5.28)
RDW: 14.6 % (ref 12.3–15.4)
WBC: 8.8 10*3/uL (ref 3.4–10.8)

## 2017-09-09 ENCOUNTER — Other Ambulatory Visit: Payer: Self-pay | Admitting: Family Medicine

## 2017-09-09 NOTE — Progress Notes (Signed)
Opened in error

## 2017-09-21 ENCOUNTER — Ambulatory Visit: Payer: Medicaid Other | Admitting: Family Medicine

## 2017-09-21 ENCOUNTER — Encounter: Payer: Self-pay | Admitting: Family Medicine

## 2017-09-21 ENCOUNTER — Other Ambulatory Visit: Payer: Self-pay

## 2017-09-21 VITALS — BP 124/60 | HR 94 | Temp 98.0°F | Ht 60.0 in | Wt 286.0 lb

## 2017-09-21 DIAGNOSIS — I872 Venous insufficiency (chronic) (peripheral): Secondary | ICD-10-CM

## 2017-09-21 DIAGNOSIS — Z23 Encounter for immunization: Secondary | ICD-10-CM | POA: Diagnosis not present

## 2017-09-21 LAB — POCT URINALYSIS DIPSTICK
Bilirubin, UA: NEGATIVE
Glucose, UA: NEGATIVE
Nitrite, UA: NEGATIVE
SPEC GRAV UA: 1.025 (ref 1.010–1.025)
Urobilinogen, UA: 1 E.U./dL
pH, UA: 7 (ref 5.0–8.0)

## 2017-09-21 NOTE — Patient Instructions (Signed)
It was a pleasure to see you today! Thank you for choosing [facility] for your primary care. Kylie Garner was seen for lower extremity swelling. You have a prescription for new stockings. Come back to clinic if it continues to worsen.   Best,  Thomes DinningBrad Thompson, MD, MS FAMILY MEDICINE RESIDENT - PGY1 09/21/2017 4:16 PM

## 2017-09-21 NOTE — Assessment & Plan Note (Signed)
Pt has persistent lower extremity swelling. Recent work up had normal BMP, CBC, BNP. Pt has no JVD or abdominal tenderness. No crackles on lung exam. No s/s of heart failure, which I do not believe is the cause of her lower extremity swelling. Will get CMP, UA to look for low protein states that may be leading to edema. Will prescribe compression stockings. Continue lasix as needed.

## 2017-09-21 NOTE — Progress Notes (Signed)
    Subjective:  Kylie Garner is a 56 y.o. female who [facility] with a chief complaint of lower extremity swelling.   HPI:  Patient has bilateral lower extremity swelling. She has compression stockings, but reports not being about to get them on because they are two small. She was recently seen at the clinic 2 weeks ago where she had lab work other causes of lower extremity swelling due to CHF or renal disease. BMP was wnl. BNP was < 20. CBC was wnl. Pt has COPD. Uses nebulizer 1-2 times per week. Not endorsing any cough or wheezing today. No trouble breathing. She denies any abdominal pain. Last time she was at clinic she was started on PO lasix 40 mg. She says that it helps with the leg swelling, but that the swelling has not completely resolved. Her weight is down 4 lbs since last measured hear. Baseline weight ~285. She says she is trying to lose weight by eating nutritional shakes at breakfast and walking more. She uses walker at baseline. She is tolerating the lasix well, though she says that it does make her urinate a lot.  Says she has dry skin on legs. It gets better with coco butter. She does try to elevate her legs when at home.   ROS: Per HPI   Objective:  Physical Exam: BP 124/60   Pulse 94   Temp 98 F (36.7 C)   Ht 5' (1.524 m)   Wt 286 lb (129.7 kg)   SpO2 97%   BMI 55.86 kg/m   Gen: NAD, resting comfortably, morbidly obese female.  CV: No JVD, RRR with no murmurs appreciated Pulm: NWOB, diminished breath sounds due to habits, no crackles  GI: Normal bowel sounds present. Soft, Nontender, Nondistended. MSK: 1-2 plus edema, skin seems taught, obesity obscures exam Skin: warm, dry, areas of eczema on lower extremities consistent with early venous stasis  No results found for this or any previous visit (from the past 72 hour(s)).   Assessment/Plan:  Venous stasis dermatitis of both lower extremities Pt has persistent lower extremity swelling. Recent  work up had normal BMP, CBC, BNP. Pt has no JVD or abdominal tenderness. No crackles on lung exam. No s/s of heart failure, which I do not believe is the cause of her lower extremity swelling. Will get CMP, UA to look for low protein states that may be leading to edema. Will prescribe compression stockings. Continue lasix as needed.     Lab Orders     CMP and Liver     Urinalysis Dipstick  No orders of the defined types were placed in this encounter.   Thomes DinningBrad Jaanai Salemi, MD, MS FAMILY MEDICINE RESIDENT - PGY1 09/21/2017 4:55 PM

## 2017-09-22 LAB — CMP AND LIVER
ALBUMIN: 3.8 g/dL (ref 3.5–5.5)
ALK PHOS: 129 IU/L — AB (ref 39–117)
ALT: 12 IU/L (ref 0–32)
AST: 12 IU/L (ref 0–40)
BUN: 15 mg/dL (ref 6–24)
Bilirubin, Direct: 0.08 mg/dL (ref 0.00–0.40)
CHLORIDE: 104 mmol/L (ref 96–106)
CO2: 23 mmol/L (ref 20–29)
Calcium: 8.9 mg/dL (ref 8.7–10.2)
Creatinine, Ser: 0.9 mg/dL (ref 0.57–1.00)
GFR calc Af Amer: 83 mL/min/{1.73_m2} (ref >59)
GFR calc non Af Amer: 72 mL/min/{1.73_m2} (ref >59)
Glucose: 93 mg/dL (ref 65–99)
POTASSIUM: 4.2 mmol/L (ref 3.5–5.2)
Sodium: 141 mmol/L (ref 134–144)
Total Protein: 6.8 g/dL (ref 6.0–8.5)

## 2017-09-23 LAB — URINE CULTURE

## 2017-09-24 ENCOUNTER — Encounter: Payer: Self-pay | Admitting: Family Medicine

## 2017-09-24 ENCOUNTER — Ambulatory Visit: Payer: Medicaid Other

## 2017-09-24 NOTE — Progress Notes (Signed)
Pt had protein in urine. Will repeat at next visit. All other labs normal.

## 2017-09-28 ENCOUNTER — Other Ambulatory Visit: Payer: Self-pay | Admitting: Family Medicine

## 2017-09-28 DIAGNOSIS — F17200 Nicotine dependence, unspecified, uncomplicated: Secondary | ICD-10-CM

## 2017-10-08 ENCOUNTER — Other Ambulatory Visit: Payer: Self-pay | Admitting: Family Medicine

## 2017-10-15 ENCOUNTER — Other Ambulatory Visit: Payer: Self-pay

## 2017-10-15 ENCOUNTER — Encounter (HOSPITAL_COMMUNITY): Payer: Self-pay | Admitting: Emergency Medicine

## 2017-10-15 ENCOUNTER — Emergency Department (HOSPITAL_COMMUNITY)
Admission: EM | Admit: 2017-10-15 | Discharge: 2017-10-15 | Disposition: A | Discharge status: Home / Self Care | Payer: Medicaid Other | Attending: Emergency Medicine | Admitting: Emergency Medicine

## 2017-10-15 DIAGNOSIS — K625 Hemorrhage of anus and rectum: Secondary | ICD-10-CM | POA: Insufficient documentation

## 2017-10-15 DIAGNOSIS — I10 Essential (primary) hypertension: Secondary | ICD-10-CM | POA: Insufficient documentation

## 2017-10-15 DIAGNOSIS — R1084 Generalized abdominal pain: Secondary | ICD-10-CM | POA: Diagnosis not present

## 2017-10-15 DIAGNOSIS — J449 Chronic obstructive pulmonary disease, unspecified: Secondary | ICD-10-CM | POA: Insufficient documentation

## 2017-10-15 DIAGNOSIS — Z87891 Personal history of nicotine dependence: Secondary | ICD-10-CM | POA: Diagnosis not present

## 2017-10-15 DIAGNOSIS — Z79899 Other long term (current) drug therapy: Secondary | ICD-10-CM | POA: Insufficient documentation

## 2017-10-15 LAB — CBC
HEMATOCRIT: 40.7 % (ref 36.0–46.0)
HEMOGLOBIN: 13.5 g/dL (ref 12.0–15.0)
MCH: 27.5 pg (ref 26.0–34.0)
MCHC: 33.2 g/dL (ref 30.0–36.0)
MCV: 82.9 fL (ref 78.0–100.0)
Platelets: 253 10*3/uL (ref 150–400)
RBC: 4.91 MIL/uL (ref 3.87–5.11)
RDW: 14.3 % (ref 11.5–15.5)
WBC: 13.2 10*3/uL — ABNORMAL HIGH (ref 4.0–10.5)

## 2017-10-15 LAB — COMPREHENSIVE METABOLIC PANEL
ALBUMIN: 3.6 g/dL (ref 3.5–5.0)
ALT: 17 U/L (ref 14–54)
ANION GAP: 8 (ref 5–15)
AST: 16 U/L (ref 15–41)
Alkaline Phosphatase: 109 U/L (ref 38–126)
BUN: 11 mg/dL (ref 6–20)
CHLORIDE: 104 mmol/L (ref 101–111)
CO2: 24 mmol/L (ref 22–32)
Calcium: 8.8 mg/dL — ABNORMAL LOW (ref 8.9–10.3)
Creatinine, Ser: 0.71 mg/dL (ref 0.44–1.00)
GFR calc non Af Amer: >60 mL/min (ref >60)
GLUCOSE: 100 mg/dL — AB (ref 65–99)
Potassium: 4.1 mmol/L (ref 3.5–5.1)
SODIUM: 136 mmol/L (ref 135–145)
Total Bilirubin: 0.8 mg/dL (ref 0.3–1.2)
Total Protein: 7.4 g/dL (ref 6.5–8.1)

## 2017-10-15 LAB — URINALYSIS, ROUTINE W REFLEX MICROSCOPIC
BILIRUBIN URINE: NEGATIVE
Glucose, UA: NEGATIVE mg/dL
KETONES UR: NEGATIVE mg/dL
Nitrite: NEGATIVE
PH: 6 (ref 5.0–8.0)
PROTEIN: NEGATIVE mg/dL
Specific Gravity, Urine: 1.017 (ref 1.005–1.030)
WBC UA: NONE SEEN WBC/hpf (ref 0–5)

## 2017-10-15 LAB — POC OCCULT BLOOD, ED: Fecal Occult Bld: POSITIVE — AB

## 2017-10-15 LAB — LIPASE, BLOOD: LIPASE: 16 U/L (ref 11–51)

## 2017-10-15 MED ORDER — GI COCKTAIL ~~LOC~~
30.0000 mL | Freq: Once | ORAL | Status: AC
  Administered 2017-10-15: 30 mL via ORAL
  Filled 2017-10-15: qty 30

## 2017-10-15 MED ORDER — AZITHROMYCIN 250 MG PO TABS: 500.0000 mg | ORAL_TABLET | Freq: Every day | ORAL | 0 refills | Status: AC

## 2017-10-15 MED ORDER — LOPERAMIDE HCL 2 MG PO CAPS
4.0000 mg | ORAL_CAPSULE | Freq: Once | ORAL | Status: AC
  Administered 2017-10-15: 4 mg via ORAL
  Filled 2017-10-15: qty 2

## 2017-10-15 NOTE — ED Provider Notes (Signed)
[facility]-EMERGENCY DEPT Provider Note   CSN: [id number] Arrival date & time: 10/15/17  1433     History   Chief Complaint Chief Complaint  Patient presents with  . Abdominal Pain  . Flank Pain    HPI Kylie Garner is a 56 y.o. female.  56 yo F with a cc of abdominal pain. Going on for past couple of days.  This is gotten worse today.  When she was called from triage she had a large bowel movement in her pants that she described as bloody.  She cleaned it off herself.  Denies feeling lightheaded having shortness of breath or weakness.  Denies fevers or chills.   The history is provided by the patient.  Abdominal Pain   This is a new problem. The current episode started yesterday. The problem occurs constantly. The problem has been gradually worsening. The pain is located in the generalized abdominal region. The quality of the pain is cramping. The pain is at a severity of 6/10. The pain is moderate. Pertinent negatives include fever, nausea, vomiting, dysuria, headaches, arthralgias and myalgias. Nothing aggravates the symptoms. Nothing relieves the symptoms.  Flank Pain  Associated symptoms include abdominal pain. Pertinent negatives include no chest pain, no headaches and no shortness of breath.    Past Medical History:  Diagnosis Date  . Anxiety   . COPD (chronic obstructive pulmonary disease) (HCC)   . Depression   . Hypertension     Patient Active Problem List   Diagnosis Date Noted  . Bilateral leg edema 09/07/2017  . Right knee pain 09/15/2016  . COPD (chronic obstructive pulmonary disease) (HCC) 11/06/2014  . Depression 11/06/2014  . Difficulty sleeping 11/06/2014  . Morbid obesity (HCC) 11/06/2014  . H/O impaired glucose tolerance 11/06/2014  . Tobacco use disorder 11/06/2014  . Venous stasis dermatitis of both lower extremities 11/06/2014    Past Surgical History:  Procedure Laterality Date  . CESAREAN SECTION  1993  . KNEE  CARTILAGE SURGERY Left     OB History    No data available       Home Medications    Prior to Admission medications   Medication Sig Start Date End Date Taking? Authorizing Provider  albuterol (PROAIR HFA) 108 (90 Base) MCG/ACT inhaler Inhale 2 puffs into the lungs every 6 (six) hours as needed for wheezing or shortness of breath. 06/11/17   Diallo, Lilia Argue, MD  albuterol (PROVENTIL) (2.5 MG/3ML) 0.083% nebulizer solution Take 3 mLs (2.5 mg total) by nebulization every 6 (six) hours as needed for wheezing or shortness of breath. 11/14/15   Jamal Collin, MD  azithromycin (ZITHROMAX) 250 MG tablet Take 2 tablets (500 mg total) by mouth daily for 3 days. Take first 2 tablets together, then 1 every day until finished. 10/15/17 10/18/17  Melene Plan, DO  buPROPion (WELLBUTRIN XL) 150 MG 24 hr tablet TAKE 1 TABLET (150 MG TOTAL) BY MOUTH DAILY. 02/25/16   Raliegh Ip, DO  furosemide (LASIX) 40 MG tablet TAKE 1 TABLET (40 MG TOTAL) DAILY BY MOUTH. 10/09/17   Garnette Gunner, MD  NICORETTE 4 MG lozenge TAKE 1 LOZENGE (4 MG TOTAL) BY MOUTH AS NEEDED FOR SMOKING CESSATION. 09/28/17   Garnette Gunner, MD  nystatin (NYSTATIN) powder Apply topically 2 (two) times daily. 11/10/16   Raliegh Ip, DO  sertraline (ZOLOFT) 100 MG tablet Take 1 tablet (100 mg total) by mouth daily. 12/03/15   Raliegh Ip, DO  Spacer/Aero-Holding Deretha Emory (  AEROCHAMBER PLUS FLO-VU LARGE) MISC 1 each by Other route once. 11/14/15   Jamal CollinJoyner, James R, MD  traZODone (DESYREL) 100 MG tablet Take 50-100 mg by mouth at bedtime as needed for sleep.    [provider]    Family History Family History  Problem Relation Age of Onset  . Heart disease Father   . Diabetes Father     Social History Social History   Tobacco Use  . Smoking status: Former Smoker    Packs/day: 0.10    Types: Cigarettes    Start date: 10/27/1976  . Smokeless tobacco: Never Used  . Tobacco comment: smokes 1 cig per day up  to 1 PPD when stressed. Set quit date for 12/03/15  Substance Use Topics  . Alcohol use: Yes    Alcohol/week: 0.0 oz  . Drug use: No     Allergies   Patient has no known allergies.   Review of Systems Review of Systems  Constitutional: Negative for chills and fever.  HENT: Negative for congestion and rhinorrhea.   Eyes: Negative for redness and visual disturbance.  Respiratory: Negative for shortness of breath and wheezing.   Cardiovascular: Negative for chest pain and palpitations.  Gastrointestinal: Positive for abdominal pain and blood in stool. Negative for nausea and vomiting.  Genitourinary: Positive for flank pain. Negative for dysuria and urgency.  Musculoskeletal: Negative for arthralgias and myalgias.  Skin: Negative for pallor and wound.  Neurological: Negative for dizziness and headaches.     Physical Exam Updated Vital Signs BP 117/78 (BP Location: Right Arm)   Pulse 73   Temp 98.3 F (36.8 C) (Oral)   Resp 20   Ht 4\' 10"  (1.473 m)   Wt 124.7 kg (275 lb)   SpO2 97%   BMI 57.48 kg/m   Physical Exam  Constitutional: She is oriented to person, place, and time. She appears well-developed and well-nourished. No distress.  HENT:  Head: Normocephalic and atraumatic.  Eyes: EOM are normal. Pupils are equal, round, and reactive to light.  Neck: Normal range of motion. Neck supple.  Cardiovascular: Normal rate and regular rhythm. Exam reveals no gallop and no friction rub.  No murmur heard. Pulmonary/Chest: Effort normal. She has no wheezes. She has no rales.  Abdominal: Soft. She exhibits no distension. There is no tenderness.  Genitourinary:  Genitourinary Comments: No stool in the vault.  Musculoskeletal: She exhibits no edema or tenderness.  Neurological: She is alert and oriented to person, place, and time.  Skin: Skin is warm and dry. She is not diaphoretic.  Psychiatric: She has a normal mood and affect. Her behavior is normal.  Nursing note and vitals  reviewed.    ED Treatments / Results  Labs (all labs ordered are listed, but only abnormal results are displayed) Labs Reviewed  COMPREHENSIVE METABOLIC PANEL - Abnormal; Notable for the following components:      Result Value   Glucose, Bld 100 (*)    Calcium 8.8 (*)    All other components within normal limits  CBC - Abnormal; Notable for the following components:   WBC 13.2 (*)    All other components within normal limits  URINALYSIS, ROUTINE W REFLEX MICROSCOPIC - Abnormal; Notable for the following components:   Hgb urine dipstick MODERATE (*)    Leukocytes, UA SMALL (*)    Bacteria, UA RARE (*)    Squamous Epithelial / LPF 0-5 (*)    All other components within normal limits  POC OCCULT BLOOD, ED -  Abnormal; Notable for the following components:   Fecal Occult Bld POSITIVE (*)    All other components within normal limits  LIPASE, BLOOD    EKG  EKG Interpretation None       Radiology No results found.  Procedures Procedures (including critical care time)  Medications Ordered in ED Medications  gi cocktail (Maalox,Lidocaine,Donnatal) (30 mLs Oral Given 10/15/17 1730)  loperamide (IMODIUM) capsule 4 mg (4 mg Oral Given 10/15/17 1730)     Initial Impression / Assessment and Plan / ED Course  I have reviewed the triage vital signs and the nursing notes.  Pertinent labs & imaging results that were available during my care of the patient were reviewed by me and considered in my medical decision making (see chart for details).     56 yo F with a chief complaint of diffuse abdominal cramping.  Going on since yesterday.  Getting worse today.  Had a large and bloody bowel movement per the patient in the waiting room.  She cleaned this up herself and so was not witnessed.  The patient vital signs are stable.  She has no abdominal pain on my exam.  Rectal exam without gross blood or stool.  Hemoglobin is higher than her normal.  Will d/c home.  Have follow up with  PCP and GI.   5:33 PM:  I have discussed the diagnosis/risks/treatment options with the patient and believe the pt to be eligible for discharge home to follow-up with PCP. We also discussed returning to the ED immediately if new or worsening sx occur. We discussed the sx which are most concerning (e.g., sudden worsening pain, fever, inability to tolerate by mouth) that necessitate immediate return. Medications administered to the patient during their visit and any new prescriptions provided to the patient are listed below.  Medications given during this visit Medications  gi cocktail (Maalox,Lidocaine,Donnatal) (30 mLs Oral Given 10/15/17 1730)  loperamide (IMODIUM) capsule 4 mg (4 mg Oral Given 10/15/17 1730)     The patient appears reasonably screen and/or stabilized for discharge and I doubt any other medical condition or other [facility] requiring further screening, evaluation, or treatment in the ED at this time prior to discharge.    Final Clinical Impressions(s) / ED Diagnoses   Final diagnoses:  Rectal bleeding    ED Discharge Orders        Ordered    azithromycin (ZITHROMAX) 250 MG tablet  Daily     10/15/17 1724       Melene PlanFloyd, Breslyn Abdo, DO 10/15/17 1733

## 2017-10-15 NOTE — ED Triage Notes (Addendum)
Per EMS pt complaint of bilateral flank pain; denies n/v/d or GU symptoms. Pt takes fluid pill; hx of "fluid build up;" feet are swollen.

## 2017-10-15 NOTE — Discharge Instructions (Signed)
Call the GI doc and schedule an appointment for likely a colonoscopy.  You hemoglobin is higher than it normally is.  Return for worsening symptoms. I will give you antibiotics for possible infection.  Take imodium.

## 2017-10-21 ENCOUNTER — Ambulatory Visit: Payer: Medicaid Other | Admitting: Family Medicine

## 2018-02-17 ENCOUNTER — Encounter: Payer: Self-pay | Admitting: Family Medicine

## 2018-02-17 ENCOUNTER — Ambulatory Visit: Payer: Medicaid Other | Admitting: Family Medicine

## 2018-02-17 DIAGNOSIS — H8110 Benign paroxysmal vertigo, unspecified ear: Secondary | ICD-10-CM | POA: Diagnosis not present

## 2018-02-17 MED ORDER — MECLIZINE HCL 25 MG PO TABS: 25.0000 mg | ORAL_TABLET | Freq: Three times a day (TID) | ORAL | 0 refills | Status: DC | PRN

## 2018-02-17 NOTE — Progress Notes (Signed)
Subjective  Kylie Garner is a 57 y.o. female is presenting with the following  DIZZINESS  Feeling dizzy for 2 days. Sudden onset when she woke up one AM and sat up in bed Dizziness described as:  spinning Feels like room spins: yes Lightheadedness when stands: no Palpitations or heart racing: no Prior dizziness: none Medications tried: none Patient believes might be causing their pain: not sure worried something bad  Taking blood thinners: no  Symptoms Hearing Loss: no Ear Pain or fullness: no Nausea or vomiting: no Vision difficulty or double vision: no Falls: no Head trauma: no Weakness in arm or leg: no Speaking problems: no Headache: no  ROS see HPI Smoking Status noted  Chief Complaint noted Review of Symptoms - see HPI PMH - Smoking status noted.    Objective Vital Signs reviewed BP 115/65 (BP Location: Right Arm, Patient Position: Sitting, Cuff Size: Normal)   Pulse 65   Temp 98 F (36.7 C) (Oral)   Ht 4\' 10"  (1.473 m)   Wt 284 lb 6.4 oz (129 kg)   SpO2 97%   BMI 59.44 kg/m  Awake alert oriented Neurologic exam : Cn 2-7 intact Strength equal & normal in upper & lower extremities Able to walk on heels and toes.   Balance normal  Romberg normal, finger to nose normal Ears:  External ear exam shows no significant lesions or deformities.  Otoscopic examination reveals clear canals, tympanic membranes are intact bilaterally without bulging, retraction, inflammation or discharge. Hearing is grossly normal bilaterall Heart - Regular rate and rhythm.  No murmurs, gallops or rubs.    Lungs:  Normal respiratory effort, chest expands symmetrically. Lungs are clear to auscultation, no crackles or wheezes. Eye - Pupils Equal Round Reactive to light, Extraocular movements intact, Fundi without hemorrhage or visible lesions, Conjunctiva without redness or discharge    Assessments/Plans  See after visit summary for details of patient instuctions  BPPV (benign  paroxysmal positional vertigo) Symptoms including onset consistent with BPPV.    I have considered and concluded the patient has a very low likelihood of having: CNS causes (CVA, CN8 tumor, Seizures, MS) Vascular structural disease (vertebral artery or aortic dissection) Cardiac structural disease (valvular, tamponade, myxoma, ischemia,) Arrhythmias, or Adrenal insufficiency.  Discussed trying meclizine

## 2018-02-17 NOTE — Assessment & Plan Note (Signed)
Symptoms including onset consistent with BPPV.    I have considered and concluded the patient has a very low likelihood of having: CNS causes (CVA, CN8 tumor, Seizures, MS) Vascular structural disease (vertebral artery or aortic dissection) Cardiac structural disease (valvular, tamponade, myxoma, ischemia,) Arrhythmias, or Adrenal insufficiency.  Discussed trying meclizine

## 2018-02-17 NOTE — Patient Instructions (Addendum)
Good to see you today!  Thanks for coming in.  You have Benign Paroxysmal Positional Vertigo BPPV  Move your head a lot to try to cause the dizziness that will help it go away quicker  Use the medicine Meclizine as needed - it will make you drowsy Start with 1/2 pill can go up to 2 pills every 6 hours   If you have severe dizzines and nausea and vomiting then come back OR if you have weakness or loss of vision OR if not better in 10-14 days

## 2018-04-19 ENCOUNTER — Ambulatory Visit: Payer: Medicaid Other | Admitting: Internal Medicine

## 2018-04-19 ENCOUNTER — Encounter: Payer: Self-pay | Admitting: Internal Medicine

## 2018-04-19 ENCOUNTER — Other Ambulatory Visit: Payer: Self-pay

## 2018-04-19 VITALS — BP 118/78 | HR 73 | Temp 98.2°F | Ht 59.0 in | Wt 291.0 lb

## 2018-04-19 DIAGNOSIS — G4485 Primary stabbing headache: Secondary | ICD-10-CM | POA: Diagnosis not present

## 2018-04-19 DIAGNOSIS — H8110 Benign paroxysmal vertigo, unspecified ear: Secondary | ICD-10-CM | POA: Diagnosis not present

## 2018-04-19 DIAGNOSIS — J449 Chronic obstructive pulmonary disease, unspecified: Secondary | ICD-10-CM | POA: Diagnosis not present

## 2018-04-19 DIAGNOSIS — R51 Headache: Secondary | ICD-10-CM | POA: Diagnosis not present

## 2018-04-19 DIAGNOSIS — R519 Headache, unspecified: Secondary | ICD-10-CM

## 2018-04-19 DIAGNOSIS — R42 Dizziness and giddiness: Secondary | ICD-10-CM | POA: Diagnosis present

## 2018-04-19 HISTORY — DX: Headache, unspecified: R51.9

## 2018-04-19 MED ORDER — KETOROLAC TROMETHAMINE 60 MG/2ML IM SOLN
60.0000 mg | Freq: Once | INTRAMUSCULAR | Status: AC
  Administered 2018-04-19: 60 mg via INTRAMUSCULAR

## 2018-04-19 MED ORDER — ALBUTEROL SULFATE HFA 108 (90 BASE) MCG/ACT IN AERS: 2.0000 | INHALATION_SPRAY | Freq: Four times a day (QID) | RESPIRATORY_TRACT | 2 refills | Status: DC | PRN

## 2018-04-19 MED ORDER — MECLIZINE HCL 25 MG PO TABS: 25.0000 mg | ORAL_TABLET | Freq: Three times a day (TID) | ORAL | 0 refills | Status: DC | PRN

## 2018-04-19 NOTE — Patient Instructions (Signed)
It was nice meeting you today Ms. Nanna!  You can continue to take Tylenol as needed today for your headache. DO NOT take ibuprofen, naproxen, or products containing these medications today. You can resume taking ibuprofen, naproxen, or products containing these medications tomorrow if necessary.   For dizziness, you can continue to take meclizine as needed. I have placed a referral to vestibular rehabilitation for you. They will call you with the date and time of your appointment.   If your dizziness or headache worsens, or if you notice changes in your vision, changes in your hearing, or numbness, weakness, or tingling, please let us know immediately or go to the emergency room.   If you have any questions or concerns, please feel free to call the clinic.   Be well,  Dr. Natale MilchLancaster

## 2018-04-19 NOTE — Progress Notes (Signed)
   Subjective:   Patient: Kylie Garner       Birthdate: Mar 08, 1961       MRN: [id number]      HPI  Athalia L Baldo DaubRobles is a 57 y.o. female presenting for HA and dizziness.   HA Started yesterday and has not improved. Rates as 10/10. No history of recurrent HA. No vision changes, numbness, weakness, tingling. Endorses phono and photophobia. Lying in dark room improves sx. Has taken Excedrin migraine which did help. Tylenol and ASA did nto help. Denies nausea, vomiting.   Dizziness Ongoing for about 3w. Was seen for this issue in 01/2018, at which time she was diagnosed with BPPV and prescribed meclizine. Says that episode lasted for about 2d, and meclizine was helpful. Dizziness completed resolved, but returned 3w ago. Initially dizziness was related to position, however now patient feels dizzy regardless of positioning. Says it feels as if the room is spinning around her, even when she is lying down. Denies nausea, vomiting. Denies vision changes. Denies numbness, weakness, tingling. Denies hearing changes. Denies recent illnesses. Meclizine has not been very helpful this time around.   Smoking status reviewed. Patient is former smoker.   Review of Systems See HPI.     Objective:  Physical Exam  Constitutional: She appears well-developed and well-nourished. No distress.  HENT:  Head: Normocephalic and atraumatic.  Eyes: Pupils are equal, round, and reactive to light. Conjunctivae and EOM are normal. Right eye exhibits no discharge. Left eye exhibits no discharge.  No nystagmus  Pulmonary/Chest: Effort normal. No respiratory distress.  Musculoskeletal:  Able to walk, sit, stand without difficulty with assistance of rolling walker. No gait abnormalities.   Neurological:  CN II-XII intact. 5/5 strength upper and lower extremities bilaterally. Full grip strength bilaterally. No issues with finger to nose or rapid alternating movements bilaterally. A&Ox4.   Psychiatric: She has a normal mood  and affect. Her behavior is normal.      Assessment & Plan:  BPPV (benign paroxysmal positional vertigo) Sx no longer changing with position, however still most consistent with benign vertigo. Less likely vestibular neuritis, as no recent illnesses; would also expect more nausea and/or vomiting with this. Less likely labyrinthitis as would expect hearing changes. No neuro deficits on exam which is reassuring and makes central cause less likely. Not taking any medications likely to cause dizziness. Refilled meclizine, however will refer to vestibular rehab for long-term treatment. Discussed strict return precaution.   Headache Onset yesterday. Description most consistent with migraine (improves in cool dark room, photo- and phonophobia, improves with Excedrin migraine). Will treat with Toradol in office given reported severity. Can continue taking Tylenol PRN today. Discussed not taking additional NSAIDs today. No neuro deficits on exam which is reassuring. No red flags reported. Strict return precautions discussed. Of note, patient also with dizziness, however dizziness has been ongoing for 3w and HA just began yesterday. Do not feel that the two are related.    Tarri AbernethyAbigail J Chiann Goffredo, MD, MPH PGY-3 [facility] Pager [phone]

## 2018-04-19 NOTE — Assessment & Plan Note (Signed)
Sx no longer changing with position, however still most consistent with benign vertigo. Less likely vestibular neuritis, as no recent illnesses; would also expect more nausea and/or vomiting with this. Less likely labyrinthitis as would expect hearing changes. No neuro deficits on exam which is reassuring and makes central cause less likely. Not taking any medications likely to cause dizziness. Refilled meclizine, however will refer to vestibular rehab for long-term treatment. Discussed strict return precaution.

## 2018-04-19 NOTE — Assessment & Plan Note (Signed)
Onset yesterday. Description most consistent with migraine (improves in cool dark room, photo- and phonophobia, improves with Excedrin migraine). Will treat with Toradol in office given reported severity. Can continue taking Tylenol PRN today. Discussed not taking additional NSAIDs today. No neuro deficits on exam which is reassuring. No red flags reported. Strict return precautions discussed. Of note, patient also with dizziness, however dizziness has been ongoing for 3w and HA just began yesterday. Do not feel that the two are related.

## 2018-05-31 DIAGNOSIS — F3132 Bipolar disorder, current episode depressed, moderate: Secondary | ICD-10-CM | POA: Diagnosis not present

## 2018-05-31 DIAGNOSIS — F331 Major depressive disorder, recurrent, moderate: Secondary | ICD-10-CM | POA: Diagnosis not present

## 2018-07-13 ENCOUNTER — Ambulatory Visit: Payer: Medicaid Other | Admitting: Family Medicine

## 2018-07-20 ENCOUNTER — Encounter: Payer: Self-pay | Admitting: Family Medicine

## 2018-07-20 ENCOUNTER — Telehealth: Payer: Self-pay

## 2018-07-20 ENCOUNTER — Ambulatory Visit: Payer: Medicaid Other | Admitting: Family Medicine

## 2018-07-20 ENCOUNTER — Other Ambulatory Visit: Payer: Self-pay

## 2018-07-20 ENCOUNTER — Other Ambulatory Visit: Payer: Self-pay | Admitting: Family Medicine

## 2018-07-20 VITALS — BP 118/69 | HR 74 | Temp 98.0°F | Wt 292.0 lb

## 2018-07-20 DIAGNOSIS — M25552 Pain in left hip: Secondary | ICD-10-CM

## 2018-07-20 DIAGNOSIS — R7303 Prediabetes: Secondary | ICD-10-CM | POA: Diagnosis not present

## 2018-07-20 DIAGNOSIS — Z23 Encounter for immunization: Secondary | ICD-10-CM | POA: Diagnosis not present

## 2018-07-20 DIAGNOSIS — R7309 Other abnormal glucose: Secondary | ICD-10-CM

## 2018-07-20 DIAGNOSIS — I872 Venous insufficiency (chronic) (peripheral): Secondary | ICD-10-CM

## 2018-07-20 HISTORY — DX: Pain in left hip: M25.552

## 2018-07-20 LAB — POCT GLYCOSYLATED HEMOGLOBIN (HGB A1C): Hemoglobin A1C: 5.7 % — AB (ref 4.0–5.6)

## 2018-07-20 MED ORDER — TRIAMCINOLONE ACETONIDE 0.5 % EX OINT: 1.0000 "application " | TOPICAL_OINTMENT | Freq: Two times a day (BID) | CUTANEOUS | 0 refills | Status: DC

## 2018-07-20 MED ORDER — TRIAMCINOLONE ACETONIDE 0.5 % EX OINT: 1.0000 "application " | TOPICAL_OINTMENT | Freq: Two times a day (BID) | CUTANEOUS | 0 refills | Status: AC

## 2018-07-20 NOTE — Assessment & Plan Note (Signed)
S/P fall. ?? Underlying arthritis. Xray ordered. Use tylenol or ibuprofen as needed for pain. Home hip exercise instruction was given.

## 2018-07-20 NOTE — Telephone Encounter (Signed)
Pt called nurse line stating she now wants her medications to go to the [facility] [place]. Pt would like the medication that was sent to [facility] to [facility]  bc its cheaper and she will be using this [facility] from now on. I have changed this in the system.

## 2018-07-20 NOTE — Telephone Encounter (Signed)
Done. Thanks.

## 2018-07-20 NOTE — Assessment & Plan Note (Signed)
A1C today of 5.7 Diet and exercise counseling done. F/U with PCP to discuss initiating Metformin which could also help with weight loss. She verbalized understanding.

## 2018-07-20 NOTE — Patient Instructions (Signed)

## 2018-07-20 NOTE — Assessment & Plan Note (Signed)
Continue Lasix as ordered by PCP. Will benefit from compression stockings. Topical steroid prescribed to alleviate itching and improve the rash. F/U soon with PCP for reassessment.

## 2018-07-20 NOTE — Progress Notes (Signed)
Subjective:     Patient ID: Kylie Garner, female   DOB: Oct 07, 1961, 57 y.o.   MRN: [id number]  Hip Pain   Incident onset: She fell on her left hip 1 month ago and she is still having pain. When she walks it lock up. The incident occurred at home (While mowing the grass at home.). The injury mechanism was a fall. The pain is present in the left hip. The pain is at a severity of 8/10. The pain is moderate. Pain course: unchanged. Associated symptoms include an inability to bear weight. Pertinent negatives include no numbness or tingling. The symptoms are aggravated by movement. She has tried rest for the symptoms. The treatment provided mild relief.  PreDM: Maternal family is diabetic, she has been drinking and urinating a lot, however, she is also on Lasix. She will like to get checked for diabetes. Leg rash: C/O of red weeping lesion on her right shin which started about a week ago. It is itchy without pain, she used topical antibiotic on it last night. There is associated chronic leg swelling for which she uses Lasix.   Current Outpatient Medications on File Prior to Visit  Medication Sig Dispense Refill  . furosemide (LASIX) 40 MG tablet TAKE 1 TABLET (40 MG TOTAL) DAILY BY MOUTH. 30 tablet 0  . NICORETTE 4 MG lozenge TAKE 1 LOZENGE (4 MG TOTAL) BY MOUTH AS NEEDED FOR SMOKING CESSATION. 81 lozenge 0  . albuterol (PROAIR HFA) 108 (90 Base) MCG/ACT inhaler Inhale 2 puffs into the lungs every 6 (six) hours as needed for wheezing or shortness of breath. (Patient not taking: Reported on 07/20/2018) 8.5 Inhaler 2  . albuterol (PROVENTIL) (2.5 MG/3ML) 0.083% nebulizer solution Take 3 mLs (2.5 mg total) by nebulization every 6 (six) hours as needed for wheezing or shortness of breath. (Patient not taking: Reported on 07/20/2018) 150 mL 1  . meclizine (ANTIVERT) 25 MG tablet Take 1 tablet (25 mg total) by mouth 3 (three) times daily as needed for dizziness. (Patient not taking: Reported on 07/20/2018) 30  tablet 0  . Spacer/Aero-Holding Chambers (AEROCHAMBER PLUS FLO-VU LARGE) MISC 1 each by Other route once. 1 each 0   No current facility-administered medications on file prior to visit.    Past Medical History:  Diagnosis Date  . Anxiety   . COPD (chronic obstructive pulmonary disease) (HCC)   . Depression   . Hypertension    Vitals:   07/20/18 0826  BP: 118/69  Pulse: 74  Temp: 98 F (36.7 C)  TempSrc: Oral  SpO2: 92%  Weight: 292 lb (132.5 kg)    Review of Systems  Respiratory: Negative.   Cardiovascular: Negative.   Gastrointestinal: Negative.   Musculoskeletal: Positive for arthralgias.       Left hip pain  Skin: Positive for rash.  Neurological: Negative for tingling and numbness.  All other systems reviewed and are negative.      Objective:   Physical Exam  Constitutional: She is oriented to person, place, and time. She appears well-developed. No distress.  Cardiovascular: Normal rate, regular rhythm and normal heart sounds.  No murmur heard. Pulmonary/Chest: Effort normal and breath sounds normal. No stridor. No respiratory distress. She has no wheezes.  Abdominal: Soft. Bowel sounds are normal. She exhibits no distension and no mass. There  is no tenderness. There is no guarding.  Musculoskeletal: Normal range of motion. She exhibits edema.       Right hip: Normal.       Left  hip: She exhibits tenderness. She exhibits normal range of motion, no swelling, no crepitus and no deformity.  Neurological: She is alert and oriented to person, place, and time.  Skin:  Hyperpigmented, erythematous macular rash on her right shin with excoriation marks.  Nursing note and vitals reviewed.        Assessment:     Hip pain PreDM Skin rash: Stasis dermatitis    Plan:     Check problem list

## 2018-08-06 ENCOUNTER — Ambulatory Visit (HOSPITAL_COMMUNITY)
Admission: RE | Admit: 2018-08-06 | Discharge: 2018-08-06 | Disposition: A | Discharge status: Home / Self Care | Payer: Medicaid Other | Source: Ambulatory Visit | Attending: Family Medicine | Admitting: Family Medicine

## 2018-08-06 DIAGNOSIS — M1611 Unilateral primary osteoarthritis, right hip: Secondary | ICD-10-CM | POA: Diagnosis not present

## 2018-08-06 DIAGNOSIS — M25552 Pain in left hip: Secondary | ICD-10-CM | POA: Insufficient documentation

## 2018-08-06 DIAGNOSIS — M1612 Unilateral primary osteoarthritis, left hip: Secondary | ICD-10-CM | POA: Insufficient documentation

## 2018-08-06 DIAGNOSIS — G8929 Other chronic pain: Secondary | ICD-10-CM | POA: Diagnosis not present

## 2018-08-16 ENCOUNTER — Ambulatory Visit (INDEPENDENT_AMBULATORY_CARE_PROVIDER_SITE_OTHER): Payer: Medicaid Other | Admitting: Family Medicine

## 2018-08-16 ENCOUNTER — Other Ambulatory Visit: Payer: Self-pay

## 2018-08-16 ENCOUNTER — Encounter: Payer: Self-pay | Admitting: Family Medicine

## 2018-08-16 DIAGNOSIS — R7303 Prediabetes: Secondary | ICD-10-CM | POA: Diagnosis not present

## 2018-08-16 DIAGNOSIS — F17200 Nicotine dependence, unspecified, uncomplicated: Secondary | ICD-10-CM | POA: Diagnosis not present

## 2018-08-16 MED ORDER — BUPROPION HCL ER (XL) 150 MG PO TB24: 150.0000 mg | ORAL_TABLET | Freq: Every day | ORAL | 0 refills | Status: DC

## 2018-08-16 MED ORDER — METFORMIN HCL 1000 MG PO TABS: 1000.0000 mg | ORAL_TABLET | Freq: Two times a day (BID) | ORAL | 3 refills | Status: DC

## 2018-08-16 NOTE — Progress Notes (Signed)
Subjective:  Kylie Garner is a 57 y.o. female who presents to the [facility] today with a chief complaint of prediabetes.   HPI:  Prediabetes Patiently recently seen and found to have elevated A1c 5.7 in September 19.  Patient is very concerned that she might developed diabetes as her mother has diabetes.  Explained to patient that diabetes type II, is a disease of high blood sugar.  Development of diabetes is a mixture of both genetics, general health such as BMI and environmental factors.  The things that we can influence her risk are include some medications and lifestyle modification such as exercise, dietary modification.  Also emphasized importance of smoking cessation given association of diabetes leading to heart disease as well as smoking.  Patient is already tried to change her diet at home including limitation of fried foods and trying to eat healthy including vegetables.  She is also tried to be more active including doing yard work with her mother and walking.  She is interested in joining a gym, she is looking at joining [employer] in November.  Smoking/tobacco abuse disorder Patient longtime smoker.  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  Patient has reduced smoking down to 1 cigarette a day.  She would like to stop smoking.  She is tried lozenges in the past however does not feel that they are very helpful.  She is interested in trying the patch not tried anything else that she knows of.  Discussed risk of smoking including worsening lung disease, increased risk of heart and stroke, increased risk of cancers.  ROS: Per HPI  PMH: Smoking history reviewed.    Objective:  Physical Exam: BP 99/62   Pulse 72   Temp 98.6 F (37 C) (Oral)   Wt 286 lb (129.7 kg)   SpO2 96%   BMI 57.76 kg/m   Gen: NAD, resting comfortably CV: RRR with no murmurs appreciated Pulm: NWOB, CTAB with no crackles, wheezes, or rhonchi GI: Normal bowel sounds present. Soft, Nontender, Nondistended. MSK: no  edema, cyanosis, or clubbing noted Skin: warm, dry Neuro: grossly normal, moves all extremities Psych: Normal affect and thought content  No results found for this or any previous visit (from the past 72 hour(s)).   Assessment/Plan:  Morbid obesity Morbid obesity with diagnosed prediabetes.  Interested in weight loss and overall improved health. BP at goal < 130/80. - Counseled on life style modifications for diet and exercise. Pt wants to join [employer] in Nov.  - Check labs to screen for renal, liver, hematologic disjunction, lipids - Nutrition referral - Metformin 1000 mg BID - Follow up in 3 weeks    Prediabetes See Morbid obesity plan  Tobacco use disorder Long time smoker with COPD. Uses 1 cigarette a day.  - Councelled on asociated risk of heart disease, stroke, especially with prediabetes - Wellbutrin 150 mg daily - 1800Quitnow provided - Follow up in 3 weeks     Lab Orders     Lipid Panel     CBC     Comprehensive metabolic panel  Meds ordered this encounter  Medications  . metFORMIN (GLUCOPHAGE) 1000 MG tablet    Sig: Take 1 tablet (1,000 mg total) by mouth 2 (two) times daily with a meal.    Dispense:  180 tablet    Refill:  3  . buPROPion (WELLBUTRIN XL) 150 MG 24 hr tablet    Sig: Take 1 tablet (150 mg total) by mouth daily.    Dispense:  30 tablet    Refill:  0      Thomes Dinning, MD, MS FAMILY MEDICINE RESIDENT - PGY2 08/16/2018 9:00 AM

## 2018-08-16 NOTE — Patient Instructions (Addendum)
It was a pleasure to see you today! Thank you for choosing [facility] for your primary care. Kylie Garner was seen for PreDiabetes.   For PreDiabetes,  - We are getting labs to check your heart, kidney, liver, and overall health.  - We are starting a medicine called metformin.  - We are referring you to Nutrition - Be sure to focus on eating fresh fruits and vegetables, avoid fried and fast food.  - Start exercising. Joining [employer] is a great idea.   For your smoking,  - We are starting a medication called Wellbutrin. This will help with cravings.   Best,  Thomes Dinning, MD, MS FAMILY MEDICINE RESIDENT - PGY2 08/16/2018 8:41 AM

## 2018-08-16 NOTE — Assessment & Plan Note (Addendum)
Morbid obesity with diagnosed prediabetes.  Interested in weight loss and overall improved health. BP at goal < 130/80. - Counseled on life style modifications for diet and exercise. Pt wants to join [employer] in Nov.  - Check labs to screen for renal, liver, hematologic disjunction, lipids - Nutrition referral - Metformin 1000 mg BID - Follow up in 3 weeks

## 2018-08-16 NOTE — Assessment & Plan Note (Signed)
Long time smoker with COPD. Uses 1 cigarette a day.  - Councelled on asociated risk of heart disease, stroke, especially with prediabetes - Wellbutrin 150 mg daily - 1800Quitnow provided - Follow up in 3 weeks

## 2018-08-16 NOTE — Assessment & Plan Note (Signed)
See Morbid obesity plan

## 2018-08-17 LAB — COMPREHENSIVE METABOLIC PANEL
ALBUMIN: 3.7 g/dL (ref 3.5–5.5)
ALT: 10 IU/L (ref 0–32)
AST: 8 IU/L (ref 0–40)
Albumin/Globulin Ratio: 1.4 (ref 1.2–2.2)
Alkaline Phosphatase: 115 IU/L (ref 39–117)
BUN/Creatinine Ratio: 12 (ref 9–23)
BUN: 11 mg/dL (ref 6–24)
CHLORIDE: 102 mmol/L (ref 96–106)
CO2: 24 mmol/L (ref 20–29)
Calcium: 9.2 mg/dL (ref 8.7–10.2)
Creatinine, Ser: 0.89 mg/dL (ref 0.57–1.00)
GFR calc Af Amer: 84 mL/min/{1.73_m2} (ref >59)
GFR calc non Af Amer: 73 mL/min/{1.73_m2} (ref >59)
GLOBULIN, TOTAL: 2.7 g/dL (ref 1.5–4.5)
Glucose: 99 mg/dL (ref 65–99)
POTASSIUM: 4 mmol/L (ref 3.5–5.2)
SODIUM: 138 mmol/L (ref 134–144)
Total Protein: 6.4 g/dL (ref 6.0–8.5)

## 2018-08-17 LAB — CBC
HEMATOCRIT: 40.4 % (ref 34.0–46.6)
HEMOGLOBIN: 13.1 g/dL (ref 11.1–15.9)
MCH: 26.8 pg (ref 26.6–33.0)
MCHC: 32.4 g/dL (ref 31.5–35.7)
MCV: 83 fL (ref 79–97)
Platelets: 269 10*3/uL (ref 150–450)
RBC: 4.89 x10E6/uL (ref 3.77–5.28)
RDW: 13.9 % (ref 12.3–15.4)
WBC: 7.9 10*3/uL (ref 3.4–10.8)

## 2018-08-17 LAB — LIPID PANEL
CHOLESTEROL TOTAL: 208 mg/dL — AB (ref 100–199)
Chol/HDL Ratio: 5.8 ratio — ABNORMAL HIGH (ref 0.0–4.4)
HDL: 36 mg/dL — ABNORMAL LOW (ref >39)
LDL CALC: 143 mg/dL — AB (ref 0–99)
TRIGLYCERIDES: 144 mg/dL (ref 0–149)
VLDL Cholesterol Cal: 29 mg/dL (ref 5–40)

## 2018-08-19 ENCOUNTER — Encounter: Payer: Self-pay | Admitting: Family Medicine

## 2018-08-30 ENCOUNTER — Encounter: Payer: Self-pay | Admitting: Dietician

## 2018-08-30 ENCOUNTER — Encounter: Payer: Medicaid Other | Attending: Family Medicine | Admitting: Dietician

## 2018-08-30 DIAGNOSIS — R7303 Prediabetes: Secondary | ICD-10-CM | POA: Insufficient documentation

## 2018-08-30 DIAGNOSIS — Z713 Dietary counseling and surveillance: Secondary | ICD-10-CM | POA: Insufficient documentation

## 2018-08-30 NOTE — Progress Notes (Signed)
Medical Nutrition Therapy  Appt Start Time: 10:30am End Time: 11:20am  Primary concerns today: Learn more about prediabetes and help with weight loss.   NUTRITION ASSESSMENT   Anthropometrics  Weight: 284 lbs   Biochemical Data & Labs (07-20-18) Hgb A1c: 5.7% (prediabetes)  Clinical Medical Hx: COPD, depression, anxiety, hypertension, obesity  Surgeries: appendectomy  Allergies: none  Medications: Wellbutrin XL, lasix, metformin, nicorette Supplements: none  Psychosocial/Lifestyle Pt states she lives with her mother. Pt states she smokes a cigarette on occasion.   24-Hr Dietary Recall First Meal: coffee (2 Splenda) Snack: none Second Meal: egg salad + 1 slice of bread  Snack: none Third Meal: spaghetti noodles + vegetables (broccoli, asparagus)  Snack: none Beverages: water, coffee   Food & Nutrition Related Hx Dietary Hx: Pt states she likes fish a lot, and now eats it baked more often than fried. Pt states she skips meals at times.  Estimated Daily Fluid Intake: 32 oz GI / Other Notable Symptoms: none  Physical Activity  Current average weekly physical activity: yard work, walking with walker (pt states she has a bad back and can't stand for long periods of time.) Pt states she plans to get a gym membership at [employer] next month.  Encouraged to engage in 150 minutes of moderate physical activity including cardiovascular and weight baring weekly.   Estimated Energy Needs Calories: 1600 Carbohydrate: 180g Protein: 100g Fat: 53g   NUTRITION DIAGNOSIS  Altered nutrition related laboratory values ([place]-2.2) related to prediabetes as evidenced by high Hgb A1c of 5.7% (reference range < 5.6%), nutrition-related knowledge deficit, and estimated intake of foods inconsistent with dietary reference intake standards.    NUTRITION INTERVENTION  Consistent carbohydrate diet (ND-1.2.4.1) by incorporating 2-3 carb choices per meal and 0-2 carb choices per snack (about 12  choices or 180g per day based on estimated energy needs of 1600 calories.)  Nutrition education (E-1) on topics including the following:   General healthful diet: well-balanced eating, food groups, portion sizes, etc.   Prediabetes: blood glucose/ A1c levels, what prediabetes is (vs. diabetes), lifestyle strategies to manage blood sugar (diet, exercise, weight management, etc.)   Carbohydrates: types, food sources, ways to balance with meals and snacks throughout the day, consistency.   Handouts Provided Include   Planning Healthy Meals   Know Your Numbers   Diabetes Types & Ways to Manage Blood Glucose   Meal Ideas   Low Carbohydrate Snack Suggestions   Carb Choices (yellow card)   Nutritional Strategies for Weight Management  Learning Style & Readiness for Change Teaching method utilized: Visual & Auditory  Demonstrated degree of understanding via: Teach Back  Barriers to learning/adherence to lifestyle change: none identified    MONITORING & EVALUATION Dietary intake, weekly physical activity, and weight.   Next Steps  Patient is to contact [facility] for follow-up appointment as needed/desired by pt or as recommended per MD.

## 2018-08-30 NOTE — Patient Instructions (Signed)
   Aim for 150 minutes of physical activity per week, even walking is great!

## 2018-09-13 ENCOUNTER — Ambulatory Visit: Payer: Medicaid Other | Admitting: Family Medicine

## 2018-09-29 ENCOUNTER — Encounter: Payer: Self-pay | Admitting: Family Medicine

## 2018-09-29 ENCOUNTER — Other Ambulatory Visit: Payer: Self-pay

## 2018-09-29 ENCOUNTER — Ambulatory Visit: Payer: Medicaid Other | Admitting: Family Medicine

## 2018-09-29 VITALS — BP 110/70 | HR 64 | Temp 97.9°F | Ht 59.0 in | Wt 276.0 lb

## 2018-09-29 DIAGNOSIS — F17200 Nicotine dependence, unspecified, uncomplicated: Secondary | ICD-10-CM

## 2018-09-29 DIAGNOSIS — R7303 Prediabetes: Secondary | ICD-10-CM | POA: Diagnosis present

## 2018-09-29 DIAGNOSIS — M5442 Lumbago with sciatica, left side: Secondary | ICD-10-CM | POA: Diagnosis not present

## 2018-09-29 DIAGNOSIS — Z1211 Encounter for screening for malignant neoplasm of colon: Secondary | ICD-10-CM | POA: Diagnosis not present

## 2018-09-29 DIAGNOSIS — Z1231 Encounter for screening mammogram for malignant neoplasm of breast: Secondary | ICD-10-CM | POA: Diagnosis not present

## 2018-09-29 DIAGNOSIS — G8929 Other chronic pain: Secondary | ICD-10-CM | POA: Insufficient documentation

## 2018-09-29 MED ORDER — DICLOFENAC SODIUM 75 MG PO TBEC: 75.0000 mg | DELAYED_RELEASE_TABLET | Freq: Two times a day (BID) | ORAL | 0 refills | Status: DC

## 2018-09-29 MED ORDER — BUPROPION HCL ER (XL) 150 MG PO TB24: 150.0000 mg | ORAL_TABLET | Freq: Every day | ORAL | 0 refills | Status: DC

## 2018-09-29 MED ORDER — METFORMIN HCL 1000 MG PO TABS: 1000.0000 mg | ORAL_TABLET | Freq: Two times a day (BID) | ORAL | 3 refills | Status: DC

## 2018-09-29 NOTE — Assessment & Plan Note (Signed)
Patient improved with lifestyle modifications and weight loss.  Additionally patient encouraged to continue these as well as to take metformin.  Metformin can also help with weight loss. -Follow-up in 1 month

## 2018-09-29 NOTE — Patient Instructions (Signed)
It was a pleasure to see you today! Thank you for choosing [facility] for your primary care. Kylie Garner was seen for pre-diabetes and smoking cessation.   1. For pre-diabetes, be sure to take the metformin.   2.For smoking, call the 1800QUITNOW and continue with welbutrin.   3. For back pain, start with the diclofenac.   Best,  Thomes DinningBrad Tori Cupps, MD, MS FAMILY MEDICINE RESIDENT - PGY2 09/29/2018 9:01 AM

## 2018-09-29 NOTE — Progress Notes (Addendum)
Established Patient Office Visit  Subjective:  Patient ID: Kylie Garner, female    DOB: 1961/08/03  Age: 57 y.o. MRN: [id number]  CC:  Chief Complaint  Patient presents with  . prediabetes    HPI Kylie Garner presents for follow-up on obesity, prediabetes, smoking, and lower back pain.   Obesity/prediabetes Patient is down 8 pounds since last visit.  Patient has been adamant about allocating food portions.  Patient did not start taking metformin.  Is interested in doing so.  Smoking Patient says that she responded well to Wellbutrin.  She is down to smoking 1 cigarette a day.  This is after meals.  Says it is difficult to fully quit on her mother still smoking.  She has not yet called 100 quit line but has a card and and wants to do so.  Lower back pain Patient uses a walker to ambulate.  She has chronic lower back pain.  She is currently not on any medications for her back pain.  She says it limits her ability to get around.  She is trying to be more active at home especially during yard work, however she is unable to stand for long time.  Her back pain was controlled, that she would be more active.  Her back pain is been a time.  Is not worsening.  It does radiate to her legs bilaterally.   Health maintenance Patient needs mammography and colon cancer screening.  Patient, is amenable to mammographic, wishes to Cologuard for colon cancer screening  Past Medical History:  Diagnosis Date  . Anxiety   . COPD (chronic obstructive pulmonary disease) (HCC)   . Depression   . Hypertension     Past Surgical History:  Procedure Laterality Date  . CESAREAN SECTION  1993  . KNEE CARTILAGE SURGERY Left     Family History  Problem Relation Age of Onset  . Heart disease Father   . Diabetes Father     Social History   Socioeconomic History  . Marital status: Single    Spouse name: Not on file  . Number of children: Not on file  . Years of education: Not on file  .  Highest education level: Not on file  Occupational History  . Not on file  Social Needs  . Financial resource strain: Not on file  . Food insecurity:    Worry: Not on file    Inability: Not on file  . Transportation needs:    Medical: Not on file    Non-medical: Not on file  Tobacco Use  . Smoking status: Former Smoker    Packs/day: 0.10    Types: Cigarettes    Start date: 10/27/1976  . Smokeless tobacco: Never Used  . Tobacco comment: smokes 1 cig per day up to 1 PPD when stressed. Set quit date for 12/03/15  Substance and Sexual Activity  . Alcohol use: Yes    Alcohol/week: 0.0 standard drinks  . Drug use: No  . Sexual activity: Not on file  Lifestyle  . Physical activity:    Days per week: Not on file    Minutes per session: Not on file  . Stress: Not on file  Relationships  . Social connections:    Talks on phone: Not on file    Gets together: Not on file    Attends religious service: Not on file    Active member of club or organization: Not on file    Attends meetings of clubs or  organizations: Not on file    Relationship status: Not on file  . Intimate partner violence:    Fear of current or ex partner: Not on file    Emotionally abused: Not on file    Physically abused: Not on file    Forced sexual activity: Not on file  Other Topics Concern  . Not on file  Social History Narrative  . Not on file    Outpatient Medications Prior to Visit  Medication Sig Dispense Refill  . albuterol (PROAIR HFA) 108 (90 Base) MCG/ACT inhaler Inhale 2 puffs into the lungs every 6 (six) hours as needed for wheezing or shortness of breath. (Patient not taking: Reported on 07/20/2018) 8.5 Inhaler 2  . albuterol (PROVENTIL) (2.5 MG/3ML) 0.083% nebulizer solution Take 3 mLs (2.5 mg total) by nebulization every 6 (six) hours as needed for wheezing or shortness of breath. (Patient not taking: Reported on 07/20/2018) 150 mL 1  . furosemide (LASIX) 40 MG tablet TAKE 1 TABLET (40 MG TOTAL)  DAILY BY MOUTH. 30 tablet 0  . meclizine (ANTIVERT) 25 MG tablet Take 1 tablet (25 mg total) by mouth 3 (three) times daily as needed for dizziness. (Patient not taking: Reported on 07/20/2018) 30 tablet 0  . NICORETTE 4 MG lozenge TAKE 1 LOZENGE (4 MG TOTAL) BY MOUTH AS NEEDED FOR SMOKING CESSATION. 81 lozenge 0  . Spacer/Aero-Holding Chambers (AEROCHAMBER PLUS FLO-VU LARGE) MISC 1 each by Other route once. 1 each 0  . buPROPion (WELLBUTRIN XL) 150 MG 24 hr tablet Take 1 tablet (150 mg total) by mouth daily. 30 tablet 0  . metFORMIN (GLUCOPHAGE) 1000 MG tablet Take 1 tablet (1,000 mg total) by mouth 2 (two) times daily with a meal. 180 tablet 3   No facility-administered medications prior to visit.     No Known Allergies  ROS Review of Systems  Constitutional: Negative for appetite change and chills.  Musculoskeletal: Positive for back pain and gait problem.  All other systems reviewed and are negative.     Objective:    Physical Exam  Constitutional: She is oriented to person, place, and time. She appears well-developed. No distress.  HENT:  Head: Normocephalic and atraumatic.  Eyes: Right eye exhibits no discharge. Left eye exhibits no discharge.  Neck: No JVD present. No tracheal deviation present.  Cardiovascular: Normal rate.  Pulmonary/Chest: Effort normal.  Abdominal: She exhibits no distension.  Musculoskeletal: She exhibits no edema.  Ambulates with walker  Neurological: She is alert and oriented to person, place, and time.  Skin: Skin is warm and dry.  Psychiatric: She has a normal mood and affect. Her behavior is normal.    BP 110/70   Pulse 64   Temp 97.9 F (36.6 C) (Oral)   Ht 4\' 11"  (1.499 m)   Wt 276 lb (125.2 kg)   SpO2 92%   BMI 55.75 kg/m  Wt Readings from Last 3 Encounters:  09/29/18 276 lb (125.2 kg)  08/30/18 284 lb (128.8 kg)  08/16/18 286 lb (129.7 kg)     Health Maintenance Due  Topic Date Due  . MAMMOGRAM  09/15/2012  . COLONOSCOPY   09/15/2012    There are no preventive care reminders to display for this patient.  Lab Results  Component Value Date   TSH 3.21 06/16/2016   Lab Results  Component Value Date   WBC 7.9 08/16/2018   HGB 13.1 08/16/2018   HCT 40.4 08/16/2018   MCV 83 08/16/2018   PLT 269 08/16/2018  Lab Results  Component Value Date   NA 138 08/16/2018   K 4.0 08/16/2018   CO2 24 08/16/2018   GLUCOSE 99 08/16/2018   BUN 11 08/16/2018   CREATININE 0.89 08/16/2018   BILITOT <0.2 08/16/2018   ALKPHOS 115 08/16/2018   AST 8 08/16/2018   ALT 10 08/16/2018   PROT 6.4 08/16/2018   ALBUMIN 3.7 08/16/2018   CALCIUM 9.2 08/16/2018   ANIONGAP 8 10/15/2017   Lab Results  Component Value Date   CHOL 208 (H) 08/16/2018   Lab Results  Component Value Date   HDL 36 (L) 08/16/2018   Lab Results  Component Value Date   LDLCALC 143 (H) 08/16/2018   Lab Results  Component Value Date   TRIG 144 08/16/2018   Lab Results  Component Value Date   CHOLHDL 5.8 (H) 08/16/2018   Lab Results  Component Value Date   HGBA1C 5.7 (A) 07/20/2018      Assessment & Plan:   Problem List Items Addressed This Visit      Nervous and Auditory   Chronic midline low back pain with left-sided sciatica    Back pain without any back imaging.  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 no red flag symptoms. Patient is not on any NSAIDs.  -Trial diclofenac -Follow-up in 1 month      Relevant Medications   buPROPion (WELLBUTRIN XL) 150 MG 24 hr tablet   diclofenac (VOLTAREN) 75 MG EC tablet     Other   Morbid obesity (HCC)   Relevant Medications   metFORMIN (GLUCOPHAGE) 1000 MG tablet   Tobacco use disorder    Improved, 1 cigarette a day.  Says that cravings are better on Wellbutrin. -Refill Wellbutrin -Encourage patient to quit line -Follow-up in 1 month      Relevant Medications   buPROPion (WELLBUTRIN XL) 150 MG 24 hr tablet   Prediabetes -  Primary    Patient improved with lifestyle modifications and weight loss.  Additionally patient encouraged to continue these as well as to take metformin.  Metformin can also help with weight loss. -Follow-up in 1 month      Relevant Medications   metFORMIN (GLUCOPHAGE) 1000 MG tablet    Other Visit Diagnoses    Special screening for malignant neoplasms, colon       Relevant Orders   Cologuard   Screening mammogram, encounter for       Relevant Orders   MM Digital Screening      Meds ordered this encounter  Medications  . metFORMIN (GLUCOPHAGE) 1000 MG tablet    Sig: Take 1 tablet (1,000 mg total) by mouth 2 (two) times daily with a meal.    Dispense:  180 tablet    Refill:  3  . buPROPion (WELLBUTRIN XL) 150 MG 24 hr tablet    Sig: Take 1 tablet (150 mg total) by mouth daily.    Dispense:  30 tablet    Refill:  0  . diclofenac (VOLTAREN) 75 MG EC tablet    Sig: Take 1 tablet (75 mg total) by mouth 2 (two) times daily.    Dispense:  30 tablet    Refill:  0    Follow-up: Return in about 4 weeks (around 10/27/2018) for back pain.    Garnette Gunner, MD

## 2018-09-29 NOTE — Assessment & Plan Note (Signed)
Improved, 1 cigarette a day.  Says that cravings are better on Wellbutrin. -Refill Wellbutrin -Encourage patient to quit line -Follow-up in 1 month

## 2018-09-29 NOTE — Assessment & Plan Note (Signed)
Back pain without any back imaging.  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 no red flag symptoms. Patient is not on any NSAIDs.  -Trial diclofenac -Follow-up in 1 month

## 2018-09-30 LAB — COLOGUARD: Cologuard: NEGATIVE

## 2018-12-02 DIAGNOSIS — F331 Major depressive disorder, recurrent, moderate: Secondary | ICD-10-CM | POA: Diagnosis not present

## 2018-12-16 ENCOUNTER — Telehealth: Payer: Self-pay | Admitting: Family Medicine

## 2018-12-16 NOTE — Telephone Encounter (Signed)
Pt states that she had lab work done in another office was but was told to call her pcp for her results. Please call pt with the results.   I asked pt where she had them done and she was not sure.

## 2018-12-17 ENCOUNTER — Encounter: Payer: Self-pay | Admitting: Family Medicine

## 2018-12-17 NOTE — Telephone Encounter (Signed)
Will forward to MD to see if he has received any results from an outside office. Kylie Garner,CMA

## 2018-12-22 NOTE — Telephone Encounter (Signed)
I am not aware of any.

## 2018-12-22 NOTE — Telephone Encounter (Signed)
Spoke with patient she was referring to her cologuard results.  I told her that these had been scanned in and it was negative.  Patient voiced understanding and had no further questions.  Jazmin Hartsell,CMA

## 2019-01-07 ENCOUNTER — Ambulatory Visit: Payer: Medicaid Other

## 2019-01-11 ENCOUNTER — Encounter: Payer: Self-pay | Admitting: Family Medicine

## 2019-01-11 ENCOUNTER — Ambulatory Visit (INDEPENDENT_AMBULATORY_CARE_PROVIDER_SITE_OTHER): Payer: Medicaid Other | Admitting: Family Medicine

## 2019-01-11 ENCOUNTER — Other Ambulatory Visit: Payer: Self-pay

## 2019-01-11 VITALS — BP 104/72 | HR 68 | Temp 98.7°F | Ht 59.0 in | Wt 262.6 lb

## 2019-01-11 DIAGNOSIS — R7303 Prediabetes: Secondary | ICD-10-CM

## 2019-01-11 LAB — POCT GLYCOSYLATED HEMOGLOBIN (HGB A1C): HEMOGLOBIN A1C: 5.5 % (ref 4.0–5.6)

## 2019-01-11 NOTE — Progress Notes (Signed)
     Subjective: Chief Complaint  Patient presents with  . glucose issues     HPI: Kylie Garner is a 58 y.o. presenting to clinic today to discuss the following:  Prediabetes Patient here for DM follow up. She states she is "worried about her blood sugar". She has had several episodes where her hands get "sweaty and then I get light headed". She has never had LOC and she does not feel like she is getting hypoglycemic. She is afraid her blood sugar is high. She does check it at home and states it has been over 150 several times. She has been dieting, avoiding sweets, foods high in sugar, and sodas. She has also felt more hungry and thirsty over the past two days. She denies any urinary symptoms.  ROS noted in HPI.   Past Medical, Surgical, Social, and Family History Reviewed & Updated per EMR.   Pertinent Historical Findings include:   Social History   Tobacco Use  Smoking Status Former Smoker  . Packs/day: 0.10  . Types: Cigarettes  . Start date: 10/27/1976  Smokeless Tobacco Never Used  Tobacco Comment   smokes 1 cig per day up to 1 PPD when stressed. Set quit date for 12/03/15    Objective: BP 104/72   Pulse 68   Temp 98.7 F (37.1 C) (Oral)   Ht 4\' 11"  (1.499 m)   Wt 262 lb 9.6 oz (119.1 kg)   SpO2 97%   BMI 53.04 kg/m  Vitals and nursing notes reviewed  Physical Exam Gen: Alert and Oriented x 3, NAD HEENT: Normocephalic, atraumatic CV: RRR, no murmurs, normal S1, S2 split Resp: CTAB, no wheezing, rales, or rhonchi, comfortable work of breathing Ext: no clubbing, cyanosis, or edema Skin: warm, dry, intact, no rashes  POCT HgbA1c: 5.5  Assessment/Plan:  Prediabetes Patient is maintaining great control with lifestyle management. A1c today is 5.5 indicating she is not even in the prediabetic range at this time. - Cont diet and execrise. Advised she increase her exercise to 30 min per day 5 times per week for maximal benefit. - Cont Metformin for help with  weight loss   PATIENT EDUCATION PROVIDED: See AVS    Diagnosis and plan along with any newly prescribed medication(s) were discussed in detail with this patient today. The patient verbalized understanding and agreed with the plan. Patient advised if symptoms worsen return to clinic or ER.    Orders Placed This Encounter  Procedures  . HgB A1c    Jules Schick, DO 01/11/2019, 2:50 PM PGY-2 [facility]

## 2019-01-11 NOTE — Patient Instructions (Addendum)
It was great to meet you today! Thank you for letting me participate in your care!  Today, we discussed your weight loss and concern for diabetes. Your test results today showed that you are no longer in prediabetes range. Please continue to avoid sweets and to exercise as losing weight will be better for your overall health and will decrease your risk of becoming diabetic. Continue taking Metformin as prescribed.     Be well, Jules Schick, DO PGY-2, [facility]

## 2019-01-21 NOTE — Assessment & Plan Note (Signed)
Patient is maintaining great control with lifestyle management. A1c today is 5.5 indicating she is not even in the prediabetic range at this time. - Cont diet and execrise. Advised she increase her exercise to 30 min per day 5 times per week for maximal benefit. - Cont Metformin for help with weight loss

## 2019-04-05 DIAGNOSIS — F4310 Post-traumatic stress disorder, unspecified: Secondary | ICD-10-CM | POA: Diagnosis not present

## 2019-04-05 DIAGNOSIS — F3132 Bipolar disorder, current episode depressed, moderate: Secondary | ICD-10-CM | POA: Diagnosis not present

## 2019-04-05 DIAGNOSIS — F331 Major depressive disorder, recurrent, moderate: Secondary | ICD-10-CM | POA: Diagnosis not present

## 2019-04-19 ENCOUNTER — Ambulatory Visit: Payer: Medicaid Other | Admitting: Family Medicine

## 2019-04-19 ENCOUNTER — Other Ambulatory Visit: Payer: Self-pay

## 2019-04-19 VITALS — BP 100/62 | HR 79

## 2019-04-19 DIAGNOSIS — M25552 Pain in left hip: Secondary | ICD-10-CM

## 2019-04-19 MED ORDER — DICLOFENAC SODIUM 1 % TD GEL: 4.0000 g | Freq: Four times a day (QID) | TRANSDERMAL | 0 refills | Status: DC

## 2019-04-19 NOTE — Assessment & Plan Note (Signed)
Patient present with Left hip pain and locking for the past few days. Pain has been intermittent over the past few months and control with OTC topical agent. Imaging from December 2019 show bilateral degenerative changes consistent with osteoarthritis. Suspect flare up vs progression of OA. Will prescribe voltaren gel and HEP. Patient will follow in a few days in symptoms to not improve with this regimen. Will then consider PT vs additional imaging for further characterization.

## 2019-04-19 NOTE — Progress Notes (Signed)
   Subjective:    Patient ID: Kylie Garner, female    DOB: [birth year], 58 y.o.   MRN: [id number]   CC: Left hip pain   HPI: Patient is a 58 yo female who presents today complaining of left hip pain. Patient reports that for the past few days her left hip has been locking on her in the morning when she wakes up. Patient states that she has "to shake my hip to unlock and then I am fine". Patient reports that she has experience mild to moderate left hip pain intermittently over the past few months and she has been using [facility] which has provided sufficient relief. She denies any recent fall, injury or trauma. Patient reports she has lost over 50 lb in the past few months and has tried to be more active. Patient denies any significant groin pain.  Smoking status reviewed   ROS: all other systems were reviewed and are negative other than in the HPI   Past Medical History:  Diagnosis Date  . Anxiety   . COPD (chronic obstructive pulmonary disease) ([place])   . Depression   . Hypertension     Past Surgical History:  Procedure Laterality Date  . CESAREAN SECTION  1993  . KNEE CARTILAGE SURGERY Left     Past medical history, surgical, family, and social history reviewed and updated in the EMR as appropriate.  Objective:  BP 100/62   Pulse 79   SpO2 97%   Vitals and nursing note reviewed  General: NAD, pleasant, able to participate in exam Cardiac: RRR, normal heart sounds, no murmurs. 2+ radial and PT pulses bilaterally Respiratory: CTAB, normal effort, No wheezes, rales or rhonchi Abdomen: soft, nontender, nondistended, no hepatic or splenomegaly, +BS Left Hip: Normal adduction and abduction, strength 5/5, sensation intact, mildly tender to palpation around greater trochanter. No bruising, deformity noted. Extremities: no edema or cyanosis. WWP. Skin: warm and dry, no rashes noted Neuro: alert and oriented x4, no focal deficits Psych: Normal affect and mood   Assessment &  Plan:   Left hip pain Patient present with Left hip pain and locking for the past few days. Pain has been intermittent over the past few months and control with OTC topical agent. Imaging from December 2019 show bilateral degenerative changes consistent with osteoarthritis. Suspect flare up vs progression of OA. Will prescribe voltaren gel and HEP. Patient will follow in a few days in symptoms to not improve with this regimen. Will then consider PT vs additional imaging for further characterization.    Marjie Skiff, MD [place] PGY-3

## 2019-04-19 NOTE — Patient Instructions (Signed)

## 2019-04-27 ENCOUNTER — Other Ambulatory Visit: Payer: Self-pay

## 2019-04-29 ENCOUNTER — Emergency Department (HOSPITAL_COMMUNITY): Payer: Medicaid Other

## 2019-04-29 ENCOUNTER — Encounter (HOSPITAL_COMMUNITY): Payer: Self-pay | Admitting: Emergency Medicine

## 2019-04-29 ENCOUNTER — Emergency Department (HOSPITAL_COMMUNITY)
Admission: EM | Admit: 2019-04-29 | Discharge: 2019-04-29 | Disposition: A | Discharge status: Home / Self Care | Payer: Medicaid Other | Attending: Emergency Medicine | Admitting: Emergency Medicine

## 2019-04-29 ENCOUNTER — Other Ambulatory Visit: Payer: Self-pay

## 2019-04-29 DIAGNOSIS — Y9389 Activity, other specified: Secondary | ICD-10-CM | POA: Diagnosis not present

## 2019-04-29 DIAGNOSIS — W228XXA Striking against or struck by other objects, initial encounter: Secondary | ICD-10-CM | POA: Insufficient documentation

## 2019-04-29 DIAGNOSIS — S9030XA Contusion of unspecified foot, initial encounter: Secondary | ICD-10-CM

## 2019-04-29 DIAGNOSIS — Y9289 Other specified places as the place of occurrence of the external cause: Secondary | ICD-10-CM | POA: Diagnosis not present

## 2019-04-29 DIAGNOSIS — S99921A Unspecified injury of right foot, initial encounter: Secondary | ICD-10-CM | POA: Diagnosis not present

## 2019-04-29 DIAGNOSIS — M79671 Pain in right foot: Secondary | ICD-10-CM | POA: Diagnosis not present

## 2019-04-29 DIAGNOSIS — Y998 Other external cause status: Secondary | ICD-10-CM | POA: Insufficient documentation

## 2019-04-29 DIAGNOSIS — S9031XA Contusion of right foot, initial encounter: Secondary | ICD-10-CM

## 2019-04-29 DIAGNOSIS — M7989 Other specified soft tissue disorders: Secondary | ICD-10-CM | POA: Diagnosis not present

## 2019-04-29 MED ORDER — MELOXICAM 15 MG PO TABS: 15.0000 mg | ORAL_TABLET | Freq: Every day | ORAL | 0 refills | Status: DC

## 2019-04-29 NOTE — ED Triage Notes (Signed)
Pt c/o right foot pain and swelling after dropping her moped's kickstand on her foot today.

## 2019-04-29 NOTE — ED Notes (Signed)
Patient verbalizes understanding of discharge instructions. Opportunity for questioning and answers were provided. Armband removed by staff, pt discharged from ED by wheelchair   

## 2019-04-29 NOTE — Discharge Instructions (Addendum)
Contact a health care provider if: Your symptoms do not improve after several days of treatment. You have redness, swelling, or pain in your foot or toes. You have difficulty moving the injured area. Your swelling or pain is not relieved with medicines. Get help right away if: You have severe pain. Your foot or toes become numb. Your foot or toes become pale or cold. You cannot move your foot or ankle. Your foot is warm to the touch.
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
## 2019-05-03 ENCOUNTER — Emergency Department (HOSPITAL_COMMUNITY)
Admission: EM | Admit: 2019-05-03 | Discharge: 2019-05-03 | Disposition: A | Discharge status: Home / Self Care | Payer: Medicaid Other | Attending: Emergency Medicine | Admitting: Emergency Medicine

## 2019-05-03 ENCOUNTER — Encounter (HOSPITAL_COMMUNITY): Payer: Self-pay | Admitting: Emergency Medicine

## 2019-05-03 ENCOUNTER — Other Ambulatory Visit: Payer: Self-pay

## 2019-05-03 DIAGNOSIS — M79671 Pain in right foot: Secondary | ICD-10-CM | POA: Diagnosis present

## 2019-05-03 DIAGNOSIS — Z87891 Personal history of nicotine dependence: Secondary | ICD-10-CM | POA: Diagnosis not present

## 2019-05-03 DIAGNOSIS — Z209 Contact with and (suspected) exposure to unspecified communicable disease: Secondary | ICD-10-CM | POA: Diagnosis not present

## 2019-05-03 DIAGNOSIS — Z79899 Other long term (current) drug therapy: Secondary | ICD-10-CM | POA: Insufficient documentation

## 2019-05-03 DIAGNOSIS — R609 Edema, unspecified: Secondary | ICD-10-CM | POA: Diagnosis not present

## 2019-05-03 DIAGNOSIS — L03115 Cellulitis of right lower limb: Secondary | ICD-10-CM | POA: Insufficient documentation

## 2019-05-03 DIAGNOSIS — I10 Essential (primary) hypertension: Secondary | ICD-10-CM | POA: Diagnosis not present

## 2019-05-03 DIAGNOSIS — R52 Pain, unspecified: Secondary | ICD-10-CM | POA: Diagnosis not present

## 2019-05-03 DIAGNOSIS — J449 Chronic obstructive pulmonary disease, unspecified: Secondary | ICD-10-CM | POA: Insufficient documentation

## 2019-05-03 MED ORDER — CEPHALEXIN 250 MG PO CAPS
500.0000 mg | ORAL_CAPSULE | Freq: Once | ORAL | Status: AC
  Administered 2019-05-03: 500 mg via ORAL
  Filled 2019-05-03: qty 2

## 2019-05-03 MED ORDER — SULFAMETHOXAZOLE-TRIMETHOPRIM 800-160 MG PO TABS
1.0000 | ORAL_TABLET | Freq: Once | ORAL | Status: AC
  Administered 2019-05-03: 1 via ORAL
  Filled 2019-05-03: qty 1

## 2019-05-03 MED ORDER — CEPHALEXIN 500 MG PO CAPS: 500.0000 mg | ORAL_CAPSULE | Freq: Four times a day (QID) | ORAL | 0 refills | Status: DC

## 2019-05-03 MED ORDER — SULFAMETHOXAZOLE-TRIMETHOPRIM 800-160 MG PO TABS: 1.0000 | ORAL_TABLET | Freq: Two times a day (BID) | ORAL | 0 refills | Status: AC

## 2019-05-03 MED ORDER — SULFAMETHOXAZOLE-TRIMETHOPRIM 800-160 MG PO TABS: 1.0000 | ORAL_TABLET | Freq: Two times a day (BID) | ORAL | 0 refills | Status: DC
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
## 2019-05-03 NOTE — Discharge Instructions (Addendum)
Please read the attached information.  If your symptoms have not improved in 48 hours please return the emergency room.  If the worsen please return immediately.

## 2019-05-03 NOTE — ED Triage Notes (Signed)
Pt BIB [facility], seen on Friday after foot injury. Pt c/o continued pain and swelling. Denies hx diabetes.

## 2019-05-04 ENCOUNTER — Ambulatory Visit: Payer: Medicaid Other | Admitting: Family Medicine

## 2019-05-05 ENCOUNTER — Ambulatory Visit: Payer: Medicaid Other | Admitting: Family Medicine

## 2019-05-05 ENCOUNTER — Other Ambulatory Visit: Payer: Self-pay

## 2019-05-05 ENCOUNTER — Encounter: Payer: Self-pay | Admitting: Family Medicine

## 2019-05-05 VITALS — BP 112/64 | HR 79 | Wt 260.5 lb

## 2019-05-05 DIAGNOSIS — S99921D Unspecified injury of right foot, subsequent encounter: Secondary | ICD-10-CM

## 2019-05-05 DIAGNOSIS — J449 Chronic obstructive pulmonary disease, unspecified: Secondary | ICD-10-CM | POA: Diagnosis not present

## 2019-05-05 MED ORDER — ALBUTEROL SULFATE (2.5 MG/3ML) 0.083% IN NEBU: 2.5000 mg | INHALATION_SOLUTION | Freq: Four times a day (QID) | RESPIRATORY_TRACT | 1 refills | Status: DC | PRN

## 2019-05-05 MED ORDER — ALBUTEROL SULFATE HFA 108 (90 BASE) MCG/ACT IN AERS: 1.0000 | INHALATION_SPRAY | Freq: Four times a day (QID) | RESPIRATORY_TRACT | 3 refills | Status: DC | PRN
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
## 2019-05-09 ENCOUNTER — Other Ambulatory Visit: Payer: Self-pay | Admitting: *Deleted

## 2019-05-09 ENCOUNTER — Telehealth: Payer: Self-pay | Admitting: Family Medicine

## 2019-05-09 MED ORDER — MELOXICAM 15 MG PO TABS: 15.0000 mg | ORAL_TABLET | Freq: Every day | ORAL | 0 refills | Status: DC

## 2019-05-09 NOTE — Telephone Encounter (Signed)
Pt informed.  Placed in to be faxed pile and requested a copy be made for batch scanning.  Christen Bame, CMA

## 2019-05-09 NOTE — Telephone Encounter (Signed)
Clinical info completed on scat form.  Place form in Dr. Biagio Borg box for completion.  Nicholson Starace, CMA

## 2019-05-09 NOTE — Telephone Encounter (Signed)
Completed form and placed in RN office.

## 2019-05-09 NOTE — Telephone Encounter (Signed)
Pt came into office to drop off form to be completed by the doctor for [employer] transportation. Form was placed in [employer] teams folder. Pt's last apt in office was on 05-05-19.

## 2019-05-23 ENCOUNTER — Ambulatory Visit: Payer: Medicaid Other | Admitting: Family Medicine

## 2019-06-14 ENCOUNTER — Encounter: Payer: Self-pay | Admitting: Family Medicine

## 2019-06-14 ENCOUNTER — Ambulatory Visit (INDEPENDENT_AMBULATORY_CARE_PROVIDER_SITE_OTHER): Payer: Medicaid Other | Admitting: Family Medicine

## 2019-06-14 ENCOUNTER — Other Ambulatory Visit: Payer: Self-pay

## 2019-06-14 VITALS — BP 108/68 | HR 68

## 2019-06-14 DIAGNOSIS — R42 Dizziness and giddiness: Secondary | ICD-10-CM | POA: Diagnosis present

## 2019-06-14 DIAGNOSIS — H8110 Benign paroxysmal vertigo, unspecified ear: Secondary | ICD-10-CM | POA: Diagnosis not present

## 2019-06-14 LAB — GLUCOSE, POCT (MANUAL RESULT ENTRY): POC Glucose: 112 mg/dl — AB (ref 70–99)

## 2019-06-14 MED ORDER — MECLIZINE HCL 25 MG PO TABS: 25.0000 mg | ORAL_TABLET | Freq: Three times a day (TID) | ORAL | 0 refills | Status: DC | PRN

## 2019-06-14 NOTE — Patient Instructions (Signed)
It was great to see you!  Our plans for today:  -We sent a refill of your meclizine into your pharmacy. -We referred you to vestibular physical therapy.  This should help with prevention of getting vertigo again. -If you notice you are still dizzy after going to physical therapy and completing meclizine, you should come back to see [place], especially if your blood pressure stays low.  Take care and seek immediate care sooner if you develop any concerns.   Dr. [facility]

## 2019-06-14 NOTE — Progress Notes (Signed)
Date of Visit: 06/14/2019   CC: dizziness  HPI:  Kylie Garner is a 58 year old female with past history of depression, COPD, and BPPV presenting to clinic with a 4 day history of dizziness and headaches. The dizziness is constant and feels as if the room is spinning. Reports feeling out of balance with sitting and standing. Denies tinnitus, hearing loss, nausea, or vomiting. No aggressors. No recent URIs. Meclizine was helpful in the past but has not used it this time.   Sharp headaches in the back of the head of the same duration. Tylenol not helping. Denies photophobia, nausea, vision changes, fevers and neck stiffness.   ROS:   General: Headache. No weight loss, mood changes, fevers or syncope.  HEENT: No changes in hearing or vision. No tinnitus, rhinorrhea or sore throat.  Cardiac: No palpitations or chest pain.  Pulm: No SOB, or wheezing. GI: Normal bowel movements. GU: No dysuria, or increased frequency. MSK: No arthralgia, myalgia or swelling in lower extremity.      [place]: Depression, BPPV, COPD, hypertension   PHYSICAL EXAM: BP 108/68   Pulse 68   SpO2 94%  Gen: Well appearing female in no acute distress HEENT: Normocephalic head with no cervical LAD. PERRL. MMM.  TM with out erythema or bulging bilaterally. No facial tenderness.  Heart: S1S2 RRR with out murmur or gallops.  Lungs: CTAB without wheezing or crackels. Good work of breathing.  Abd: Normoactive bowel sounds with no guarding or tenderness to palpation.  Neuro: CN 2-12 intact. 5/5 upper and lower extremity strength. No issues with finger to nose.  Ext: No lower extremity edema.   ASSESSMENT/PLAN:  BPPV (benign paroxysmal positional vertigo) 4 day history of vertigo without tinnitus, n/v, changes in vision or recent URIs. Her past history and her presentation make me suspicious of BPPV. Her orthostatic BP did not change by 20/10 mm HG, making me think less about dizziness secondary to orthostatic hypotension.  Her blood glucose today was 112 making me think less about hypoglycemia. So dizziness most likely secondary to BPPV. Patient should continue taking meclizine as it was helpful with her last dizziness episodes and should follow up with vestibular PT.   FOLLOW UP: Follow up if no improvement with dizziness.   Vernice Jefferson MS3, Medical Student

## 2019-06-14 NOTE — Assessment & Plan Note (Addendum)
4 day history of vertigo without tinnitus, n/v, changes in vision or recent URIs. Her past history and her presentation make me suspicious of BPPV. Her orthostatic BP did not change by 20/10 mm HG, making me think less about dizziness secondary to orthostatic hypotension. Her blood glucose today was 112 making me think less about hypoglycemia. So dizziness most likely secondary to BPPV. Patient should continue taking meclizine as it was helpful with her last dizziness episodes and should follow up with vestibular PT.

## 2019-06-15 NOTE — Progress Notes (Signed)
  I personally saw and evaluated the patient, performing the key elements of the service. I developed and verified the management plan that is described in the medical student's note and I agree with the content with my edits below.   Patient declines headaches on my examination, otherwise symptoms as described in student's note.  BPPV Symptoms and exam most consistent with repeat bout of BPPV. Neurological exam wnl with exception of "feeling room spin" with positional changes. Marye Round attempted, no nystagmus appreciated though patient had eyes closed with marked exacerbation of symptoms. Will send refill of meclizine and refer to vestibular PT. BP low normal though negative orthostatics, recommend f/u with PCP for recheck BP, not on any antihypertensives.  Rory Percy, DO PGY-3, [facility] 06/15/2019 11:33 AM

## 2019-06-27 ENCOUNTER — Other Ambulatory Visit: Payer: Self-pay

## 2019-06-27 ENCOUNTER — Encounter: Payer: Self-pay | Admitting: Physical Therapy

## 2019-06-27 ENCOUNTER — Ambulatory Visit: Payer: Medicaid Other | Attending: Family Medicine | Admitting: Physical Therapy

## 2019-06-27 DIAGNOSIS — H8111 Benign paroxysmal vertigo, right ear: Secondary | ICD-10-CM | POA: Diagnosis present

## 2019-06-27 DIAGNOSIS — R2681 Unsteadiness on feet: Secondary | ICD-10-CM

## 2019-06-27 DIAGNOSIS — R42 Dizziness and giddiness: Secondary | ICD-10-CM | POA: Diagnosis present

## 2019-06-28 NOTE — Therapy (Signed)
[facility] [address] [place], [place], [address] Phone: [phone]   Fax:  [phone]  Physical Therapy Evaluation  Patient Details  Name: Kylie Garner MRN: [id number] Date of Birth: [birth year] Referring Provider (PT): Leeanne Rio, MD   Encounter Date: 06/27/2019  PT End of Session - 06/28/19 0653    Visit Number  1    Number of Visits  4    Date for PT Re-Evaluation  07/28/19    Authorization Type  Medicaid - awaiting approval of 3 visits    PT Start Time  1105    PT Stop Time  1150    PT Time Calculation (min)  45 min    Activity Tolerance  Patient tolerated treatment well    Behavior During Therapy  [facility] for tasks assessed/performed       Past Medical History:  Diagnosis Date  . Anxiety   . COPD (chronic obstructive pulmonary disease) ([place])   . Depression   . Hypertension     Past Surgical History:  Procedure Laterality Date  . CESAREAN SECTION  1993  . KNEE CARTILAGE SURGERY Left     There were no vitals filed for this visit.   Subjective Assessment - 06/27/19 1109    Subjective  First time experiencing vertigo - felt like a roller coaster 4 weeks ago when she went to lie down in bed the whole room began spinning.  Hasn't been able to drive her scooter, almost hit a tree because of dizziness.  Started taking Meclizine and it was helping a little bit but she is wondering if she needs a stronger medicine.  Pt also reports having a harder time speaking/finding the right words to say and had a hard time with coordination testing at the doctor's office.  Is taking Meclizine as prescribed.  Is no longer diabetic but has been having low BP.    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  almost falls, fell into the wall      Home Environment   Additional Comments  unable to do yard work; cut grass      Prior Function   Level of Independence  Independent      Observation/Other Assessments   Focus on Therapeutic Outcomes (FOTO)   N/A      Sensation   Light Touch  Appears Intact      Coordination   Gross Motor Movements are Fluid and Coordinated  Yes      ROM / Strength   AROM / PROM / Strength  Strength      Strength   Overall Strength  Within  functional limits for tasks performed           Vestibular Assessment - 06/27/19 1118      Vestibular Assessment   General Observation  No evidence of imbalance when walking but pt reports feeling dizzy      Symptom Behavior   Subjective history of current problem  began 4 weeks ago; denies headache or neck pain, denies changes in vision or hearing, denies tinnitus    Type of Dizziness   Spinning    Frequency of Dizziness  every day    Duration of Dizziness  30 minutes    Symptom Nature  Motion provoked;Positional    Aggravating Factors  Lying supine;Supine to sit;Comment;Rolling to right;Rolling to left;Forward bending   riding scooter, mowing lawn    Relieving Factors  No known relieving factors    Progression of Symptoms  Worse      Oculomotor Exam   Ocular ROM  WFL    Spontaneous  Absent    Gaze-induced   Absent    Smooth Pursuits  Intact    Saccades  Poor trajectory;Slow    Comment  Convergence impaired.  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  Holds head with R head tilt, R ptosis - no history of CVA or other central involvement - pt has mild difficulty following cues      Oculomotor Exam-Fixation Suppressed    Left Head Impulse  unable to fully assess; pt unable to keep gaze fixed, blinking a lot    Right Head Impulse  unable to fully assess; pt unable to keep gaze fixed, blinking a lot      Vestibulo-Ocular Reflex   VOR to Slow Head Movement  Comment   reports dizziness   VOR Cancellation  Comment   reports dizziness     Positional Testing   Dix-Hallpike  Dix-Hallpike Right;Dix-Hallpike Left    Sidelying Test  --    Horizontal Canal Testing  Horizontal Canal Right;Horizontal Canal Left      Dix-Hallpike Right   Dix-Hallpike Right Duration  40 seconds    Dix-Hallpike Right Symptoms  Downbeat Nystagmus      Dix-Hallpike Left   Dix-Hallpike Left Duration  0    Dix-Hallpike Left Symptoms  No nystagmus      Horizontal Canal Right   Horizontal Canal Right Duration  0    Horizontal Canal Right Symptoms  Normal      Horizontal Canal Left   Horizontal Canal Left Duration  0    Horizontal Canal Left Symptoms  Normal          Objective measurements completed on examination: See above findings.       Vestibular Treatment/Exercise - 06/27/19 1148      Vestibular Treatment/Exercise   Vestibular Treatment Provided  Canalith Repositioning    Canalith Repositioning  Comment   deep head hanging     OTHER   Comment  deep head hanging x 1 repetition with pt reporting subjective vertigo when in deep head hanging position and then upon sitting upright            PT Education - 06/28/19 0653    Education Details  clinical findings, BPPV, PT POC, goals and interventions    Person(s) Educated  Patient    Methods  Explanation    Comprehension  Verbalized understanding          PT Long Term Goals - 06/28/19 0657      PT LONG TERM GOAL #1  Title  Pt will demonstrate independence with  vestibular and balance HEP    Baseline  dependent to date    Time  4    Period  Weeks    Status  New    Target Date  07/28/19      PT LONG TERM GOAL #2   Title  Pt will demonstrate negative positional testing (no nystagmus or symptoms of vertigo)    Baseline  R Hallpike-Dix pt is positive for downbeating nystagmus and vertigo    Time  4    Period  Weeks    Status  New    Target Date  07/28/19      PT LONG TERM GOAL #3   Title  Pt will report return to mowing the lawn without dizziness    Baseline  not able to do yard work/mow due to significant dizziness    Time  4    Period  Weeks    Status  New    Target Date  07/28/19      PT LONG TERM GOAL #4   Title  Pt will report returning to driving her scooter without dizziness and imbalance    Baseline  currently unable to drive    Time  4    Period  Weeks    Status  New    Target Date  07/28/19             Plan - 06/28/19 0654    Clinical Impression Statement  Pt is a 58 year old female referred to Neuro OPPT for evaluation of vertigo lasting 4 weeks.  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 The following deficits were noted during pt's exam: impaired oculomotor exam but pt demonstrated some difficulty following cues/commands with testing, motion sensitivity, impaired balance and downbeating nystagmus of short duration during R hallpike-dix maneuver which may indicate anterior/superior canal canalithiasis or atypical BPPV of the posterior canal (otoconia in long arm of canal causing inhibition); treated x 1 with deep head hanging maneuver.  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, obesity, tobacco use, depression, COPD, anxiety    Examination-Activity Limitations  Bed Mobility;Bend;Locomotion Level;Reach Overhead    Examination-Participation Restrictions  Driving;Yard Work    Stability/Clinical Decision Making  Stable/Uncomplicated    Clinical Decision Making  Low    Rehab Potential  Good    PT Frequency  1x / week    PT Duration  3 weeks    PT Treatment/Interventions  Canalith Repostioning;Therapeutic activities;Balance training;Gait training;Functional mobility training;Neuromuscular re-education;Patient/family education;Vestibular    PT Next Visit Plan  reassess for anterior canal vs. ampullary side of posterior canal BPPV.  Treat if indicated.  Provide x1 viewing and balance exercises if still having difficulty with yard work and driving scooter    Consulted and Agree with Plan of Care  Patient       Patient will benefit from skilled therapeutic intervention in order to improve the following deficits and impairments:  Decreased balance, Dizziness, Difficulty walking  Visit Diagnosis: BPPV (benign paroxysmal positional vertigo), right  Dizziness and giddiness  Unsteadiness on feet  Problem List Patient Active Problem List   Diagnosis Date Noted  . 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  . Left hip pain 07/20/2018  . Prediabetes 07/20/2018  . Headache 04/19/2018  . BPPV (benign paroxysmal positional vertigo) 02/17/2018  . Bilateral leg edema 09/07/2017  . Right knee pain 09/15/2016  . COPD (chronic obstructive pulmonary disease) ([facility]) 11/06/2014  . Depression 11/06/2014  . Difficulty sleeping 11/06/2014  . Morbid obesity ([facility]) 11/06/2014  . H/O impaired glucose tolerance 11/06/2014  . Tobacco use disorder 11/06/2014  . Stasis dermatitis 11/06/2014    Dierdre HighmanAudra F Diya Gervasi, PT, DPT 06/28/19    6:59 AM    [facility]  Rehabilitation [facility] [address] [place], [place], [address] Phone: [phone]   Fax:  [phone]  Name: Cleon Gustinammy L Pratte MRN: [id number] Date of Birth: May 17, 1961

## 2019-07-01 ENCOUNTER — Other Ambulatory Visit: Payer: Self-pay | Admitting: Family Medicine

## 2019-07-06 DIAGNOSIS — F331 Major depressive disorder, recurrent, moderate: Secondary | ICD-10-CM | POA: Diagnosis not present

## 2019-07-06 DIAGNOSIS — F4310 Post-traumatic stress disorder, unspecified: Secondary | ICD-10-CM | POA: Diagnosis not present

## 2019-07-06 DIAGNOSIS — F3132 Bipolar disorder, current episode depressed, moderate: Secondary | ICD-10-CM | POA: Diagnosis not present

## 2019-07-11 ENCOUNTER — Ambulatory Visit: Payer: Medicaid Other | Admitting: Family Medicine

## 2019-07-11 ENCOUNTER — Encounter: Payer: Self-pay | Admitting: Family Medicine

## 2019-07-11 ENCOUNTER — Other Ambulatory Visit: Payer: Self-pay

## 2019-07-11 VITALS — BP 118/62 | HR 67

## 2019-07-11 DIAGNOSIS — Z23 Encounter for immunization: Secondary | ICD-10-CM | POA: Diagnosis not present

## 2019-07-11 DIAGNOSIS — H8110 Benign paroxysmal vertigo, unspecified ear: Secondary | ICD-10-CM | POA: Diagnosis not present

## 2019-07-11 NOTE — Addendum Note (Signed)
Addended by: Josephine Igo B on: 07/11/2019 11:37 AM   Modules accepted: Level of Service

## 2019-07-11 NOTE — Assessment & Plan Note (Signed)
Patient with ongoing vertiginous symptoms.  Seems consistent with BPPV although was unable to elicit nystagmus on exam.  Less likely labyrinthitis or Mnire's disease.  Patient says she is had improvement with vestibular physical therapy.  Plans to continue this.  Use meclizine as needed for symptoms.  If no improvement with completion of vestibular PT, low threshold to refer to neurology for ongoing issues.

## 2019-07-11 NOTE — Patient Instructions (Signed)
It was a pleasure to see you today! Thank you for choosing [facility] for your primary care. Kylie Garner was seen for vertigo.   Please continue the physical therapy for a few more weeks to see if this does not continue to improve your vertigo.  You have no improvement please call office and we will refer you to neurology for further work-up.  Come back to the clinic if you develop hearing loss, severe headaches, or weakness associated with your vertigo.  Best,  Marny Lowenstein, MD, MS FAMILY MEDICINE RESIDENT - PGY3 07/11/2019 8:55 AM

## 2019-07-11 NOTE — Progress Notes (Signed)
    Subjective:  Kylie Garner is a 58 y.o. female who presents to the [facility] today with a chief complaint of continued dizziness.   HPI:  Patient says that she has had continued dizziness.  Says that it occurs typically when lying on her left side and rolling over to the right.  She says that when this occurs that the world is turning and looks like it shaking.  Patient said that she  tried to mow her grass and was chided by her mother who said that she was not able to mow the grass in a straight line.  Patient and I chuckled at this.  Patient says that when she does have an episode of dizziness that the meclizine does help.  Patient denies frequent headaches or hearing loss or tinnitus special with episodes of dizziness.  Patient says that she has gone to vestibular physical therapy 1 time.  She says here to did multiple physical exam maneuvers.  I show her video of the Epley maneuver she said this is very similar to what they did for her.  She says this did help some but that she still had continued episodes.  We discussed what her wishes are going forward.  Patient says that for now things are controlled with that Atrovent and she wishes to go see the physical therapist for the remaining 2 weeks of her sessions.  I told her that I agree with this plan and that if we do not have any further improvement with physical therapy that we will refer to neurology.  Patient is in agreement with this.  Patient is asking that I complete a scat form for today.  ROS: Per HPI   Objective:  Physical Exam: BP 118/62   Pulse 67   SpO2 96%   Gen: NAD, resting comfortably HEENT: no vertical or horizontal nystagmus CV: RRR with no murmurs appreciated Pulm: NWOB, CTAB with no crackles, wheezes, or rhonchi GI: soft, Nontender, Nondistended. MSK: no edema, cyanosis, or clubbing noted Skin: warm, dry Neuro: grossly normal, moves all extremities Psych: Normal affect and thought content  No results found for this  or any previous visit (from the past 72 hour(s)).   Assessment/Plan:  No problem-specific Assessment & Plan notes found for this encounter.   Lab Orders  No laboratory test(s) ordered today    No orders of the defined types were placed in this encounter.     Marny Lowenstein, MD, MS FAMILY MEDICINE RESIDENT - PGY3 07/11/2019 10:00 AM

## 2019-07-13 ENCOUNTER — Ambulatory Visit: Payer: Medicaid Other | Attending: Family Medicine | Admitting: Rehabilitative and Restorative Service Providers"

## 2019-07-13 ENCOUNTER — Ambulatory Visit: Payer: Medicaid Other | Admitting: Physical Therapy

## 2019-07-13 ENCOUNTER — Other Ambulatory Visit: Payer: Self-pay

## 2019-07-13 ENCOUNTER — Encounter: Payer: Self-pay | Admitting: Rehabilitative and Restorative Service Providers"

## 2019-07-13 DIAGNOSIS — R42 Dizziness and giddiness: Secondary | ICD-10-CM | POA: Diagnosis not present

## 2019-07-13 DIAGNOSIS — R2681 Unsteadiness on feet: Secondary | ICD-10-CM | POA: Diagnosis not present

## 2019-07-13 DIAGNOSIS — H8111 Benign paroxysmal vertigo, right ear: Secondary | ICD-10-CM | POA: Diagnosis not present

## 2019-07-13 NOTE — Therapy (Signed)
[facility] [address] [place], [place], [address] Phone: [phone]   Fax:  [phone]  Physical Therapy Treatment  Patient Details  Name: Cleon Gustinammy L Minor MRN: [id number] Date of Birth: 02-04-1961 Referring Provider (PT): Latrelle DodrillMcIntyre, Brittany J, MD   Encounter Date: 07/13/2019  PT End of Session - 07/13/19 0926    Visit Number  2    Number of Visits  4    Date for PT Re-Evaluation  07/28/19    Authorization Type  Medicaid - awaiting approval of 3 visits    PT Start Time  [phone]    PT Stop Time  0930    PT Time Calculation (min)  38 min    Activity Tolerance  Patient tolerated treatment well    Behavior During Therapy  [facility] for tasks assessed/performed       Past Medical History:  Diagnosis Date  . Anxiety   . COPD (chronic obstructive pulmonary disease) (HCC)   . Depression   . Hypertension     Past Surgical History:  Procedure Laterality Date  . CESAREAN SECTION  1993  . KNEE CARTILAGE SURGERY Left     There were no vitals filed for this visit.  Subjective Assessment - 07/13/19 0853    Subjective  The patient continues with occasional symptoms of dizziness with rolling to the left, but feels like it is improving.  She saw her MD yesterday for this complaint.    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  No/denies             Vestibular Assessment - 07/13/19 0858      Positional Testing   Dix-Hallpike  Dix-Hallpike Right;Dix-Hallpike Left    Sidelying Test  Sidelying Right;Sidelying Left    Horizontal Canal Testing  Horizontal Canal Right;Horizontal Canal Left      Dix-Hallpike Right   Dix-Hallpike Right Duration  sensation of being on a roller coaster, no spinning    Dix-Hallpike Right Symptoms  No  nystagmus      Dix-Hallpike Left   Dix-Hallpike Left Duration  10-15 second duration of spinning reported, no nystagmus in room light    Dix-Hallpike Left Symptoms  No nystagmus      Sidelying Right   Sidelying Right Duration  none    Sidelying Right Symptoms  No nystagmus      Sidelying Left   Sidelying Left Duration  trace sensation lasting <10 seconds    Sidelying Left Symptoms  No nystagmus   in room light     Horizontal Canal Right   Horizontal Canal Right Duration  none    Horizontal Canal Right Symptoms  Normal      Horizontal Canal Left   Horizontal Canal Left Duration  none    Horizontal Canal Left Symptoms  Normal      Orthostatics   BP supine (x 5 minutes)  110/72    HR supine (x 5 minutes)  56    BP standing (after 1 minute)  116/77   mild lightheadedness reported   HR standing (after 1 minute)  76    BP standing (after 3 minutes)  129/80    HR standing (after 3 minutes)  70  Vestibular Treatment/Exercise - 07/13/19 0907      Vestibular Treatment/Exercise   Vestibular Treatment Provided  Habituation;Gaze    Canalith Repositioning  Epley Manuever Left    Habituation Exercises  Nestor Lewandowsky    Gaze Exercises  X1 Viewing Horizontal       EPLEY MANUEVER LEFT   Number of Reps   1     RESPONSE DETAILS LEFT  based on symptoms worse with left rolling and sensation of dizziness with rolling left at home.      Nestor Lewandowsky   Number of Reps   5    Symptom Description   symptoms to the left improve with repetition; she gets lightheaded sensation with return to sit and a mild sense of spinning to the left side      X1 Viewing Horizontal   Foot Position  seated    Comments  performed to determine if need for VOR for HEP.  Patient notes mild dizziness with movement and PT noted refixation saccades to return to target.                 PT Long Term Goals - 07/13/19 1448      PT LONG TERM GOAL #1   Title  Pt will demonstrate  independence with vestibular and balance HEP    Baseline  dependent to date    Time  4    Period  Weeks    Status  New      PT LONG TERM GOAL #2   Title  Pt will demonstrate negative positional testing (no nystagmus or symptoms of vertigo)    Baseline  R Hallpike-Dix pt is positive for downbeating nystagmus and vertigo    Time  4    Period  Weeks    Status  New      PT LONG TERM GOAL #3   Title  Pt will report return to mowing the lawn without dizziness    Baseline  not able to do yard work/mow due to significant dizziness    Time  4    Period  Weeks    Status  New      PT LONG TERM GOAL #4   Title  Pt will report returning to driving her scooter without dizziness and imbalance    Baseline  currently unable to drive    Time  4    Period  Weeks    Status  New            Plan - 07/13/19 0930    Clinical Impression Statement  The patient did not have nystagmus viewed in room light today.  She has symptoms worse with L dix hallpike and L sidelying.  PT treated based on symptoms with some improvement noted.  She c/o lightheaded with return to sitting so orthostatic measurements checked-- not significant change.  With lying onto the left side, she has trace symptoms of spinning.  Habituation provided and gaze adaptation given due to provocation of mild symptoms.    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    Consulted and Agree with Plan of Care  Patient       Patient will benefit from skilled therapeutic intervention in order to improve the following deficits  and impairments:     Visit Diagnosis: Dizziness and  giddiness  Unsteadiness on feet     Problem List Patient Active Problem List   Diagnosis Date Noted  . 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  . Left hip pain 07/20/2018  . Prediabetes 07/20/2018  . Headache 04/19/2018  . BPPV (benign paroxysmal positional vertigo) 02/17/2018  . Bilateral leg edema 09/07/2017  . Right knee pain 09/15/2016  . COPD (chronic obstructive pulmonary disease) (HCC) 11/06/2014  . Depression 11/06/2014  . Difficulty sleeping 11/06/2014  . Morbid obesity (HCC) 11/06/2014  . H/O impaired glucose tolerance 11/06/2014  . Tobacco use disorder 11/06/2014  . Stasis dermatitis 11/06/2014    Kodah Maret, PT 07/13/2019, 9:32 AM  [facility] [address] [place], [place], [address] Phone: [phone]   Fax:  [phone]  Name: KHAMARI MCCLENTON MRN: [id number] Date of Birth: [birth year]

## 2019-07-13 NOTE — Patient Instructions (Signed)
Access Code: [email or web address]  URL: [email or web address]  Date: 07/13/2019  Prepared by: Rudell Cobb   Exercises Brandt-Daroff Vestibular Exercise - 5 reps - 1 sets - 2x daily - 7x weekly Seated Gaze Stabilization with Head Rotation - 1 reps - 1 sets - 30 seconds hold - 3x daily - 7x weekly

## 2019-07-18 ENCOUNTER — Telehealth: Payer: Self-pay | Admitting: Family Medicine

## 2019-07-18 NOTE — Telephone Encounter (Signed)
Checked provider's box and there is no mail in there from this patient.  Will check with provider to see if he received it yet.  Jazmin Hartsell,CMA

## 2019-07-18 NOTE — Telephone Encounter (Signed)
Patient is asking about a SCAT form that had to be corrected and it was mailed here for pcp to correct/finish.  She is wanting to know if we received it.

## 2019-07-19 NOTE — Telephone Encounter (Signed)
I have not yet seen the form. I will keep an eye out for it.

## 2019-07-20 ENCOUNTER — Encounter: Payer: Self-pay | Admitting: Physical Therapy

## 2019-07-20 ENCOUNTER — Ambulatory Visit: Payer: Medicaid Other | Admitting: Physical Therapy

## 2019-07-20 ENCOUNTER — Other Ambulatory Visit: Payer: Self-pay

## 2019-07-20 DIAGNOSIS — H8111 Benign paroxysmal vertigo, right ear: Secondary | ICD-10-CM | POA: Diagnosis not present

## 2019-07-20 DIAGNOSIS — R42 Dizziness and giddiness: Secondary | ICD-10-CM | POA: Diagnosis not present

## 2019-07-20 DIAGNOSIS — R2681 Unsteadiness on feet: Secondary | ICD-10-CM | POA: Diagnosis not present

## 2019-07-20 NOTE — Therapy (Signed)
[facility] [address] [place], [place], [address] Phone: [phone]   Fax:  [phone]  Physical Therapy Treatment  Patient Details  Name: Kylie Garner MRN: [id number] Date of Birth: [birth year] Referring Provider (PT): Leeanne Rio, MD   Encounter Date: 07/20/2019  PT End of Session - 07/20/19 1004    Visit Number  3    Number of Visits  4    Date for PT Re-Evaluation  07/28/19    Authorization Type  Medicaid - 3 visits approved 9/16 - 10/06    Authorization - Visit Number  2    Authorization - Number of Visits  3    PT Start Time  0806    PT Stop Time  0845    PT Time Calculation (min)  39 min    Activity Tolerance  Patient tolerated treatment well    Behavior During Therapy  [facility] for tasks assessed/performed       Past Medical History:  Diagnosis Date  . Anxiety   . COPD (chronic obstructive pulmonary disease) ([place])   . Depression   . Hypertension     Past Surgical History:  Procedure Laterality Date  . CESAREAN SECTION  1993  . KNEE CARTILAGE SURGERY Left     There were no vitals filed for this visit.  Subjective Assessment - 07/20/19 0808    Subjective  Still having some dizziness, more to the L than the R.  Was taking 15 minutes for it to settle but now it doesn't take as long.    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, obesity, tobacco use, depression, COPD, anxiety    Patient Stated Goals  To get rid of the dizziness             Vestibular Assessment - 07/20/19 0822      Vestibular Assessment   General Observation  Utilized frenzel lenses for re assessment today due to tendency to suppress nystagmus in room light      Positional Testing   Dix-Hallpike  Dix-Hallpike Right;Dix-Hallpike Left    Horizontal Canal Testing  Horizontal Canal Right;Horizontal Canal Left      Dix-Hallpike Right   Dix-Hallpike Right Duration  very mild sensation of spinning 1-2 seconds    Dix-Hallpike Right Symptoms  Downbeat Nystagmus   Also performed deep head hanging with downbeat, R rotary     Dix-Hallpike Left   Dix-Hallpike Left Duration  1-2 seconds of very mild spinning    Dix-Hallpike Left Symptoms  No nystagmus      Horizontal Canal Right   Horizontal Canal Right Duration  0    Horizontal Canal Right Symptoms  Normal      Horizontal Canal Left   Horizontal Canal Left Duration  0    Horizontal Canal Left Symptoms  Normal                Vestibular Treatment/Exercise - 07/20/19 0824      Vestibular Treatment/Exercise   Vestibular Treatment Provided  Canalith Repositioning;Gaze    Habituation Exercises  Nestor Lewandowsky      OTHER   Comment  Deep head hanging maneuver to test and treat due to greater symptoms in deep head hanging.  Performed x 2 reps with improvement in symptoms      Nestor Lewandowsky   Number of Reps   2    Symptom Description   Modified sequence to bias long  arm of posterior canal on R side.  Head rotated to L through full sequence (when on R side and when on R side)            PT Education - 07/20/19 1003    Education Details  adjusted Brandt-Daroff to bias long arm of posterior canal R side due to nystagmus and symptoms with frenzels donned. Plan to re-assess at next visit and request more visits if needed    Person(s) Educated  Patient    Methods  Explanation;Demonstration;Handout    Comprehension  Verbalized understanding;Returned demonstration          PT Long Term Goals - 07/13/19 1194      PT LONG TERM GOAL #1   Title  Pt will demonstrate independence with vestibular and balance HEP    Baseline  dependent to date    Time  4    Period  Weeks    Status  New      PT LONG TERM GOAL #2   Title  Pt will demonstrate negative positional testing (no nystagmus or symptoms of vertigo)    Baseline  R Hallpike-Dix pt is positive for downbeating  nystagmus and vertigo    Time  4    Period  Weeks    Status  New      PT LONG TERM GOAL #3   Title  Pt will report return to mowing the lawn without dizziness    Baseline  not able to do yard work/mow due to significant dizziness    Time  4    Period  Weeks    Status  New      PT LONG TERM GOAL #4   Title  Pt will report returning to driving her scooter without dizziness and imbalance    Baseline  currently unable to drive    Time  4    Period  Weeks    Status  New            Plan - 07/20/19 1006    Clinical Impression Statement  Due to ongoing reports of dizziness but tendency to suppress nystagmus in room light performed re-assessment of all canals with frenzel lenses to block fixation.  Pt continued to demonstrate symptoms and mild downbeat nystagmus during R hallpike-dix; when moved into deep head hanging pt experienced increased downbeat nystagmus with slight R rotation.  Performed deep head hanging treatment x 2 - pt's overall symptom duration and intensity have significantly decreased overall.  Adjusted Brandt-Daroff to bias long arm of posterior canal.  Will continue to address and progress towards LTG.    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  check goals - ask for more visits if needed.    Consulted and Agree with Plan of Care  Patient       Patient will benefit from skilled therapeutic intervention in order to improve the following deficits and impairments:     Visit Diagnosis: Dizziness and giddiness  Unsteadiness on feet  BPPV (benign paroxysmal positional vertigo), right     Problem  List Patient Active Problem List   Diagnosis Date Noted  . 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  . Left hip pain 07/20/2018  . Prediabetes  07/20/2018  . Headache 04/19/2018  . BPPV (benign paroxysmal positional vertigo) 02/17/2018  . Bilateral leg edema 09/07/2017  . Right knee pain 09/15/2016  . COPD (chronic obstructive pulmonary disease) (HCC) 11/06/2014  . Depression 11/06/2014  . Difficulty sleeping 11/06/2014  . Morbid obesity (HCC) 11/06/2014  . H/O impaired glucose tolerance 11/06/2014  . Tobacco use disorder 11/06/2014  . Stasis dermatitis 11/06/2014    Dierdre Highman, PT, DPT 07/20/19    10:10 AM    [facility] [address] [place], [place], [address] Phone: [phone]   Fax:  [phone]  Name: ZYLYNN HALBIG MRN: [id number] Date of Birth: [birth year]

## 2019-07-20 NOTE — Patient Instructions (Signed)
Habituation - Tip Card  1.The goal of habituation training is to assist in decreasing symptoms of vertigo, dizziness, or nausea provoked by specific head and body motions. 2.These exercises may initially increase symptoms; however, be persistent and work through symptoms. With repetition and time, the exercises will assist in reducing or eliminating symptoms. 3.Exercises should be stopped and discussed with the therapist if you experience any of the following: - Sudden change or fluctuation in hearing - New onset of ringing in the ears, or increase in current intensity - Any fluid discharge from the ear - Severe pain in neck or back - Extreme nausea    Habituation - Sit to Side-Lying   Sit on edge of bed. Turn your head to the LEFT, Lie down onto the right side looking up at the ceiling and hold until dizziness stops, plus 20 seconds.  Return to sitting and wait until dizziness stops, plus 20 seconds.  Keep head turned to the LEFT and lie down to left side (looking down at floor).  Repeat sequence 5 times per session. Do 2 sessions per day.

## 2019-07-25 ENCOUNTER — Telehealth: Payer: Self-pay | Admitting: Family Medicine

## 2019-07-25 NOTE — Telephone Encounter (Signed)
Clinical info completed on scat form.  Place form in Dr. Biagio Borg box for completion.  Jazmin Hartsell, CMA

## 2019-07-25 NOTE — Telephone Encounter (Signed)
Patient came into office to drop off paperwork to be completed by doctor for Transportation, pt's last in office visit was on 07-11-19. Forms were placed in blue team folder.

## 2019-07-27 ENCOUNTER — Ambulatory Visit: Payer: Medicaid Other | Admitting: Physical Therapy

## 2019-07-27 ENCOUNTER — Telehealth: Payer: Self-pay | Admitting: Physical Therapy

## 2019-07-27 NOTE — Telephone Encounter (Signed)
Completed physician form and placed in RN box.

## 2019-07-27 NOTE — Telephone Encounter (Signed)
Contacted pt regarding missed visit today.  Alerted pt that her first three visits were approved by [employer] through 10/06.  Requested pt to contact clinic to be rescheduled for third visit on or before 10/06.  If therapist does not hear back from pt, will attempt to contact again.  Rico Junker, PT, DPT 07/27/19    11:12 AM

## 2019-08-05 ENCOUNTER — Telehealth: Payer: Self-pay | Admitting: Family Medicine

## 2019-08-05 NOTE — Telephone Encounter (Signed)
Received patient's SCAT form back by fax stating that it was incomplete. Specifically part A page 4-6 (to be completed by patient) and part B page 3 (to be completed by physician). I have re-completed part B. I called patient to inform her of this. I have left the form at the front of the office for patient to finish her portion. Called patient to inform her of this. No answer. LVM.

## 2019-08-25 ENCOUNTER — Encounter: Payer: Self-pay | Admitting: Physical Therapy

## 2019-08-25 NOTE — Therapy (Signed)
[facility] [address] [place], [place], [address] Phone: [phone]   Fax:  [phone]  Patient Details  Name: Kylie Garner MRN: [id number] Date of Birth: [birth year] Referring Provider:  No ref. provider found  Encounter Date: 08/25/2019  PHYSICAL THERAPY DISCHARGE SUMMARY  Visits from Start of Care: 3  Current functional level related to goals / functional outcomes: Unable to assess; pt did not return for final scheduled visit and when therapist attempted to contact patient to reschedule pt did not answer and did not return call.  If pt experiences a return in symptoms she will require new physician order.     Remaining deficits: dizziness   Education / Equipment: HEP  Plan: Patient agrees to discharge.  Patient goals were not met. Patient is being discharged due to not returning since the last visit.  ?????     Rico Junker, PT, DPT 08/25/19    1:04 PM    [facility] [address] [place], [place], [address] Phone: [phone]   Fax:  [phone]

## 2019-10-07 ENCOUNTER — Telehealth: Payer: Self-pay | Admitting: Family Medicine

## 2019-10-07 NOTE — Telephone Encounter (Signed)
Evaluated patient for tooth extraction pre-op evaluation. Patient is low risk. I have signed the form form her oral surgeon. I have placed it in nursing pool to pull the requested documents to be faxed over to her dentist.

## 2019-10-10 ENCOUNTER — Other Ambulatory Visit: Payer: Self-pay

## 2019-10-10 DIAGNOSIS — R7303 Prediabetes: Secondary | ICD-10-CM

## 2019-10-11 MED ORDER — METFORMIN HCL 1000 MG PO TABS: 1000.0000 mg | ORAL_TABLET | Freq: Two times a day (BID) | ORAL | 3 refills | Status: DC

## 2019-11-21 ENCOUNTER — Ambulatory Visit (HOSPITAL_BASED_OUTPATIENT_CLINIC_OR_DEPARTMENT_OTHER): Admit: 2019-11-21 | Payer: Medicaid Other | Admitting: Oral Surgery

## 2019-11-21 ENCOUNTER — Encounter (HOSPITAL_BASED_OUTPATIENT_CLINIC_OR_DEPARTMENT_OTHER): Payer: Self-pay

## 2019-11-21 SURGERY — MULTIPLE EXTRACTION WITH ALVEOLOPLASTY
Anesthesia: General | Laterality: Bilateral

## 2019-12-09 ENCOUNTER — Other Ambulatory Visit: Payer: Self-pay | Admitting: Family Medicine

## 2019-12-09 DIAGNOSIS — F17200 Nicotine dependence, unspecified, uncomplicated: Secondary | ICD-10-CM

## 2019-12-19 ENCOUNTER — Telehealth: Payer: Self-pay | Admitting: Orthopaedic Surgery

## 2019-12-19 DIAGNOSIS — F3132 Bipolar disorder, current episode depressed, moderate: Secondary | ICD-10-CM | POA: Diagnosis not present

## 2019-12-19 DIAGNOSIS — F4310 Post-traumatic stress disorder, unspecified: Secondary | ICD-10-CM | POA: Diagnosis not present

## 2019-12-19 NOTE — Telephone Encounter (Signed)
Patient called back. She stated she needs records by Wednesday for DDS appt. She will come by tomorrow to sign release form and I will have records ready for her.

## 2019-12-19 NOTE — Telephone Encounter (Signed)
Received vm from patient wanting copy of records. IC,lmvm advised need to come in to sign authorization form before we can release records.

## 2020-01-13 ENCOUNTER — Ambulatory Visit: Payer: Medicaid Other | Attending: Internal Medicine

## 2020-01-13 DIAGNOSIS — Z23 Encounter for immunization: Secondary | ICD-10-CM

## 2020-01-13 NOTE — Progress Notes (Signed)
   Covid-19 Vaccination Clinic  Name:  Kylie Garner    MRN: [id number] DOB: Jun 01, 1961  01/13/2020  [name] was observed post Covid-19 immunization for 15 minutes without incident. She was provided with Vaccine Information Sheet and instruction to access the V-Safe system.   [name] was instructed to call 911 with any severe reactions post vaccine: Marland Kitchen Difficulty breathing  . Swelling of face and throat  . A fast heartbeat  . A bad rash all over body  . Dizziness and weakness   Immunizations Administered    Name Date Dose VIS Date Route   Pfizer COVID-19 Vaccine 01/13/2020 10:26 AM 0.3 mL 10/07/2019 Intramuscular   Manufacturer: [employer], [employer]   Lot: [id number]   NDC: [address]-0

## 2020-02-08 ENCOUNTER — Ambulatory Visit: Payer: Medicaid Other | Attending: Internal Medicine

## 2020-02-08 DIAGNOSIS — Z23 Encounter for immunization: Secondary | ICD-10-CM

## 2020-02-08 NOTE — Progress Notes (Signed)
   Covid-19 Vaccination Clinic  Name:  Kylie Garner    MRN: [id number] DOB: [birth year]  02/08/2020  [name] was observed post Covid-19 immunization for 15 minutes without incident. She was provided with Vaccine Information Sheet and instruction to access the V-Safe system.   [name] was instructed to call 911 with any severe reactions post vaccine: Marland Kitchen Difficulty breathing  . Swelling of face and throat  . A fast heartbeat  . A bad rash all over body  . Dizziness and weakness   Immunizations Administered    Name Date Dose VIS Date Route   Pfizer COVID-19 Vaccine 02/08/2020  9:56 AM 0.3 mL 10/07/2019 Intramuscular   Manufacturer: [employer], [employer]   Lot: [id number]   NDC: [address]-0

## 2020-03-06 ENCOUNTER — Telehealth: Payer: Self-pay | Admitting: Family Medicine

## 2020-03-06 NOTE — Telephone Encounter (Signed)
Patient has not been seen since 2020 and it was not to discuss her knee.  Called patient and made her an appointment to discuss her knee pain and the need for a referral.  Victorian Gunn,CMA

## 2020-03-06 NOTE — Telephone Encounter (Signed)
Pt left a voicemail asking for a referral to [facility] to see Dr. Ramond Marrow.  She did not say why she needed the referral. jw

## 2020-03-14 ENCOUNTER — Ambulatory Visit (INDEPENDENT_AMBULATORY_CARE_PROVIDER_SITE_OTHER): Payer: Medicaid Other | Admitting: Family Medicine

## 2020-03-14 ENCOUNTER — Other Ambulatory Visit: Payer: Self-pay

## 2020-03-14 ENCOUNTER — Encounter: Payer: Self-pay | Admitting: Family Medicine

## 2020-03-14 VITALS — BP 110/80 | HR 87 | Ht 63.0 in | Wt 261.2 lb

## 2020-03-14 DIAGNOSIS — R7303 Prediabetes: Secondary | ICD-10-CM

## 2020-03-14 DIAGNOSIS — Z1231 Encounter for screening mammogram for malignant neoplasm of breast: Secondary | ICD-10-CM

## 2020-03-14 DIAGNOSIS — Z720 Tobacco use: Secondary | ICD-10-CM

## 2020-03-14 DIAGNOSIS — M25562 Pain in left knee: Secondary | ICD-10-CM

## 2020-03-14 DIAGNOSIS — G8929 Other chronic pain: Secondary | ICD-10-CM | POA: Insufficient documentation

## 2020-03-14 HISTORY — DX: Encounter for screening mammogram for malignant neoplasm of breast: Z12.31

## 2020-03-14 LAB — POCT GLYCOSYLATED HEMOGLOBIN (HGB A1C): HbA1c, POC (controlled diabetic range): 5.6 % (ref 0.0–7.0)

## 2020-03-14 MED ORDER — CHANTIX STARTING MONTH PAK 0.5 MG X 11 & 1 MG X 42 PO TABS: ORAL_TABLET | ORAL | 0 refills | Status: DC

## 2020-03-14 MED ORDER — MELOXICAM 15 MG PO TABS: 15.0000 mg | ORAL_TABLET | Freq: Every day | ORAL | 0 refills | Status: DC

## 2020-03-14 MED ORDER — TRAMADOL HCL 50 MG PO TABS: 50.0000 mg | ORAL_TABLET | Freq: Three times a day (TID) | ORAL | 0 refills | Status: AC | PRN

## 2020-03-14 NOTE — Progress Notes (Signed)
    SUBJECTIVE:   CHIEF COMPLAINT / HPI:   Chronic left knee pain Likely OA, however we do not have any imaging of this.  Patient describes it as "bone-on-bone ".  Extensive right knee imaging that shows extensive OA.  She is using Mobic and diclofenac.  She was previously getting steroid injections at her prior orthopedist, however these were not helping.  She is in significant pain and has difficulty walking.  She would like referral to Kylie Garner to discuss possible knee replacement options.  Smoking She smokes 1/2 pack a day.  She is transporting in the past.  She is interested in quitting today.  She has tried Wellbutrin and in RT which did help some but she reverted back to smoking.  She would like to try something different today.  Pre-diabetes A1c has been stable in the 5s.  Checked on today is 5.6.  She takes at 1000 mg of Metformin daily.   health maintenance Needs mammogram Screening labs for renal dysfunction but lipid panel   OBJECTIVE:   BP 110/80   Pulse 87   Ht 5\' 3"  (1.6 m)   Wt 261 lb 3.2 oz (118.5 kg)   SpO2 94%   BMI 46.27 kg/m   Gen: NAD, resting comfortably Pulm: NWOB Left knee Inspection: no effusione Palpation: TTP along medial and lateral jointline ROM: normal  Strength: 5/5 Stability: neg ant/pos drawer    ASSESSMENT/PLAN:   Chronic pain of left knee Likely OA based on history of Right knee. No imaging. Pain despite NSAIDS, steroid injections, and diclofenac.  - short trial of tramadol for breakthrough pain - referral to - left knee xray  Prediabetes A1C wnl.  - cont metformin - BMP, lipid panel  Encounter for screening mammogram for malignant neoplasm of breast Mammogram ordered  Tobacco abuse -trial chantix      Kylie Harness, MD [facility]

## 2020-03-14 NOTE — Assessment & Plan Note (Signed)
-  trial chantix

## 2020-03-14 NOTE — Patient Instructions (Addendum)
For your left knee pain,  we are getting the x-ray.   We are refilling your Mobic.  We are also trialing a short course of tramadol.   We are also referring you to [facility].   Please follow-up in 2 weeks to see how you are tolerating the tramadol..  We are referring for mammogram.  Please call the number below.  [address], [place], [place] [address] Phone: [phone]

## 2020-03-14 NOTE — Assessment & Plan Note (Signed)
A1C wnl.  - cont metformin - BMP, lipid panel

## 2020-03-14 NOTE — Assessment & Plan Note (Addendum)
Likely OA based on history of Right knee. No imaging. Pain despite NSAIDS, steroid injections, and diclofenac.  - short trial of tramadol for breakthrough pain - referral to Kylie Garner - left knee xray

## 2020-03-14 NOTE — Assessment & Plan Note (Signed)
Mammogram ordered

## 2020-03-15 LAB — BASIC METABOLIC PANEL
BUN/Creatinine Ratio: 31 — ABNORMAL HIGH (ref 9–23)
BUN: 26 mg/dL — ABNORMAL HIGH (ref 6–24)
CO2: 23 mmol/L (ref 20–29)
Calcium: 9.5 mg/dL (ref 8.7–10.2)
Chloride: 104 mmol/L (ref 96–106)
Creatinine, Ser: 0.84 mg/dL (ref 0.57–1.00)
GFR calc Af Amer: 89 mL/min/{1.73_m2} (ref >59)
GFR calc non Af Amer: 77 mL/min/{1.73_m2} (ref >59)
Glucose: 95 mg/dL (ref 65–99)
Potassium: 4.6 mmol/L (ref 3.5–5.2)
Sodium: 139 mmol/L (ref 134–144)

## 2020-03-15 LAB — LIPID PANEL
Chol/HDL Ratio: 5.3 ratio — ABNORMAL HIGH (ref 0.0–4.4)
Cholesterol, Total: 228 mg/dL — ABNORMAL HIGH (ref 100–199)
HDL: 43 mg/dL (ref >39)
LDL Chol Calc (NIH): 152 mg/dL — ABNORMAL HIGH (ref 0–99)
Triglycerides: 181 mg/dL — ABNORMAL HIGH (ref 0–149)
VLDL Cholesterol Cal: 33 mg/dL (ref 5–40)

## 2020-03-29 ENCOUNTER — Ambulatory Visit: Payer: Medicaid Other

## 2020-04-04 ENCOUNTER — Ambulatory Visit: Payer: Medicaid Other

## 2020-04-11 ENCOUNTER — Other Ambulatory Visit: Payer: Self-pay | Admitting: Family Medicine

## 2020-04-11 DIAGNOSIS — G8929 Other chronic pain: Secondary | ICD-10-CM

## 2020-04-14 ENCOUNTER — Other Ambulatory Visit: Payer: Self-pay | Admitting: Family Medicine

## 2020-04-14 DIAGNOSIS — G8929 Other chronic pain: Secondary | ICD-10-CM

## 2020-05-10 DIAGNOSIS — F4310 Post-traumatic stress disorder, unspecified: Secondary | ICD-10-CM | POA: Diagnosis not present

## 2020-05-10 DIAGNOSIS — F3132 Bipolar disorder, current episode depressed, moderate: Secondary | ICD-10-CM | POA: Diagnosis not present

## 2020-07-25 ENCOUNTER — Ambulatory Visit: Payer: Medicaid Other

## 2020-08-02 DIAGNOSIS — F3132 Bipolar disorder, current episode depressed, moderate: Secondary | ICD-10-CM | POA: Diagnosis not present

## 2020-08-02 DIAGNOSIS — F4310 Post-traumatic stress disorder, unspecified: Secondary | ICD-10-CM | POA: Diagnosis not present

## 2022-02-27 ENCOUNTER — Encounter: Payer: Medicaid Other | Admitting: Student

## 2022-02-27 NOTE — Progress Notes (Deleted)
Octa

## 2022-03-14 ENCOUNTER — Ambulatory Visit (INDEPENDENT_AMBULATORY_CARE_PROVIDER_SITE_OTHER): Payer: Medicaid - Out of State | Admitting: Student

## 2022-03-14 ENCOUNTER — Encounter: Payer: Self-pay | Admitting: Student

## 2022-03-14 VITALS — BP 98/62 | HR 79 | Ht 63.0 in | Wt 268.6 lb

## 2022-03-14 DIAGNOSIS — Z1211 Encounter for screening for malignant neoplasm of colon: Secondary | ICD-10-CM | POA: Diagnosis not present

## 2022-03-14 DIAGNOSIS — Z1231 Encounter for screening mammogram for malignant neoplasm of breast: Secondary | ICD-10-CM | POA: Diagnosis not present

## 2022-03-14 DIAGNOSIS — R7303 Prediabetes: Secondary | ICD-10-CM

## 2022-03-14 DIAGNOSIS — Z1212 Encounter for screening for malignant neoplasm of rectum: Secondary | ICD-10-CM

## 2022-03-14 LAB — POCT GLYCOSYLATED HEMOGLOBIN (HGB A1C): Hemoglobin A1C: 5.7 % — AB (ref 4.0–5.6)

## 2022-03-14 NOTE — Progress Notes (Signed)
  SUBJECTIVE:   CHIEF COMPLAINT / HPI:   HTN Vitals:   03/14/22 1026  BP: 98/62  Pulse: 79  SpO2: 97%  Meds: None Symptoms: No chest pain or dizziness.     DM Lab Results  Component Value Date   HGBA1C 5.7 (A) 03/14/2022  Meds: Metformin 1000 mg BID, no longer taking  HLD Lab Results  Component Value Date   CHOL 228 (H) 03/14/2020   HDL 43 03/14/2020   LDLCALC 152 (H) 03/14/2020   TRIG 181 (H) 03/14/2020   CHOLHDL 5.3 (H) 03/14/2020  Meds: none The 10-year ASCVD risk score (Arnett DK, et al., 2019) is: 9.7%   Values used to calculate the score:     Age: 61 years     Sex: Female     Is Non-Hispanic African American: No     Diabetic: Yes     Tobacco smoker: Yes     Systolic Blood Pressure: 98 mmHg     Is BP treated: No     HDL Cholesterol: 43 mg/dL     Total Cholesterol: 228 mg/dL  Depression: PHQ-9: 14 Issues with social stressors, sometimes feels like she has to scream, and does.  Some thoughts: of wishing she was not here. But feels encouraged by relationship with daughter.   Health Maintenance:  Pap: Neg 2017, due 2022, patient reports had one done in January and will send results Mammo: Negative 2010, requesting Colonoscopy: Neg Cologuard 2019, rept due 2022, wanting home test    PERTINENT  PMH / PSH: Prediabetes, COPD, MDD   OBJECTIVE:  BP 98/62   Pulse 79   Ht 5\' 3"  (1.6 m)   Wt 268 lb 9.6 oz (121.8 kg)   SpO2 97%   BMI 47.58 kg/m   General: NAD, pleasant, able to participate in exam, obese Cardiac: RRR, no murmurs auscultated. Respiratory: CTAB, normal effort, no wheezes, rales or rhonchi Abdomen: soft, non-tender, non-distended, normoactive bowel sounds Extremities: warm and well perfused, no edema or cyanosis, no swelling Skin: warm and dry, no rashes noted Neuro: alert, no obvious focal deficits, speech normal Psych: Normal affect and mood  ASSESSMENT/PLAN:  No problem-specific Assessment & Plan notes found for this encounter.    Health Maitenance: CRC: - Cologurad ordered Breast Cancer screen: - Mammogram ordered High Cholesterol: - Lipid Panel ordered Prediabetes: - HgbA1c ordered  Depression: Patient with significant social stressors and low/depressed mood. Reports she has thought she would be better off not alive, but denies plan, and says her daughter is helping her/supporting her, and that she is her daughters main support as well.  -Follow up in 1 week to further assess   [facility] [place] application Paperwork left for completion  Orders Placed This Encounter  Procedures   MM Digital Screening    Standing Status:   Future    Standing Expiration Date:   03/15/2023    Order Specific Question:   Reason for Exam (SYMPTOM  OR DIAGNOSIS REQUIRED)    Answer:   breast cancer screening    Order Specific Question:   Is the patient pregnant?    Answer:   No    Order Specific Question:   Preferred imaging location?    Answer:   [facility]   Lipid Panel   Cologuard   POCT A1C   No orders of the defined types were placed in this encounter.  No follow-ups on file. @SIGNNOTE @

## 2022-03-14 NOTE — Patient Instructions (Signed)
It was great to see you! Thank you for allowing me to participate in your care!  I recommend that you always bring your medications to each appointment as this makes it easy to ensure we are on the correct medications and helps [place] not miss when refills are needed.  Our plans for today:  - Screening   Diabetes screening, Cholesterol screening, Cologuard for Colon Cancer, Mammogram for Breast Cancer - Depression  Follow up in 1 week to discuss depression specifically -[facility] [place] application  Received, will finish and fax  We are checking some labs today, I will call you if they are abnormal will send you a MyChart message or a letter if they are normal.  If you do not hear about your labs in the next 2 weeks please let us know.   Take care and seek immediate care sooner if you develop any concerns.   Dr. Bess Kinds, MD [facility]

## 2022-03-15 LAB — LIPID PANEL
Chol/HDL Ratio: 5.6 ratio — ABNORMAL HIGH (ref 0.0–4.4)
Cholesterol, Total: 190 mg/dL (ref 100–199)
HDL: 34 mg/dL — ABNORMAL LOW (ref >39)
LDL Chol Calc (NIH): 121 mg/dL — ABNORMAL HIGH (ref 0–99)
Triglycerides: 195 mg/dL — ABNORMAL HIGH (ref 0–149)
VLDL Cholesterol Cal: 35 mg/dL (ref 5–40)

## 2022-03-20 ENCOUNTER — Ambulatory Visit: Payer: Medicaid - Dental | Admitting: Student

## 2022-03-20 NOTE — Patient Instructions (Incomplete)
It was great to see you! Thank you for allowing me to participate in your care!  I recommend that you always bring your medications to each appointment as this makes it easy to ensure we are on the correct medications and helps us not miss when refills are needed.  Our plans for today:  - *** -   We are checking some labs today, I will call you if they are abnormal will send you a MyChart message or a letter if they are normal.  If you do not hear about your labs in the next 2 weeks please let us know.***  Take care and seek immediate care sooner if you develop any concerns.   Dr. Jakevion Arney, MD [facility]  

## 2022-03-20 NOTE — Progress Notes (Unsigned)
  SUBJECTIVE:   CHIEF COMPLAINT / HPI:   Go over labs    Depression Sleep Interest Guilt Energy Concentration Appetite Psychomotor Suicide *Fluoxetine 20 mg  HLD Lab Results  Component Value Date   CHOL 190 03/14/2022   HDL 34 (L) 03/14/2022   LDLCALC 121 (H) 03/14/2022   TRIG 195 (H) 03/14/2022   CHOLHDL 5.6 (H) 03/14/2022  The 10-year ASCVD risk score (Arnett DK, et al., 2019) is: 9.9%   Values used to calculate the score:     Age: 61 years     Sex: Female     Is Non-Hispanic African American: No     Diabetic: Yes     Tobacco smoker: Yes     Systolic Blood Pressure: 98 mmHg     Is BP treated: No     HDL Cholesterol: 34 mg/dL     Total Cholesterol: 190 mg/dL  Mod Statin recommended: Crestor 10  Pap Results sent?    PERTINENT  PMH / PSH: ***  Past Medical History:  Diagnosis Date   Anxiety    COPD (chronic obstructive pulmonary disease) (HCC)    Depression    Hypertension     Past Surgical History:  Procedure Laterality Date   CESAREAN SECTION  1993   KNEE CARTILAGE SURGERY Left     OBJECTIVE:  There were no vitals taken for this visit.  General: NAD, pleasant, able to participate in exam Cardiac: RRR, no murmurs auscultated. Respiratory: CTAB, normal effort, no wheezes, rales or rhonchi Abdomen: soft, non-tender, non-distended, normoactive bowel sounds Extremities: warm and well perfused, no edema or cyanosis. Skin: warm and dry, no rashes noted Neuro: alert, no obvious focal deficits, speech normal Psych: Normal affect and mood  ASSESSMENT/PLAN:  No problem-specific Assessment & Plan notes found for this encounter.   No orders of the defined types were placed in this encounter.  No orders of the defined types were placed in this encounter.  No follow-ups on file. @SIGNNOTE @ {    This will disappear when note is signed, click to select method of visit    :1}

## 2022-03-28 ENCOUNTER — Encounter: Payer: Self-pay | Admitting: Student

## 2022-03-28 ENCOUNTER — Telehealth: Payer: Self-pay | Admitting: Student

## 2022-03-28 LAB — COLOGUARD: COLOGUARD: NEGATIVE

## 2022-03-28 NOTE — Telephone Encounter (Signed)
Attempted to call patient, line not working. Patient [employer] [place] paperwork complete and needing to be picked up. Patient needing to sign and send to agency.

## 2022-03-30 NOTE — Progress Notes (Signed)
    SUBJECTIVE:   CHIEF COMPLAINT / HPI: depressed mood   Patient presents for follow up of depressed mood. Patient reports a combination of stressors after the passing of her mother. She reports some turmoil with her siblings in regards to finances and spending three months without housing. She reports that she continues to have some stressors while living with her nephew. The patient is requesting refills of her Zoloft and a medication to take as needed for increased anxiety or depression needs.   PERTINENT  PMH / PSH:  COPD BPPV Depression   OBJECTIVE:   BP 131/61   Pulse 86   Ht 5\' 3"  (1.6 m)   Wt 266 lb 2 oz (120.7 kg)   SpO2 93%   BMI 47.14 kg/m   Physical Exam Constitutional:      Appearance: She is normal weight.  HENT:     Right Ear: External ear normal.     Left Ear: External ear normal.     Nose: Nose normal.     Mouth/Throat:     Pharynx: Oropharynx is clear.  Cardiovascular:     Rate and Rhythm: Normal rate and regular rhythm.  Pulmonary:     Effort: Pulmonary effort is normal.  Abdominal:     General: Bowel sounds are normal.     Palpations: Abdomen is soft.  Musculoskeletal:     Cervical back: Normal range of motion.  Skin:    General: Skin is warm.  Neurological:     General: No focal deficit present.     Mental Status: She is alert and oriented to person, place, and time.  Psychiatric:        Attention and Perception: Attention normal.        Mood and Affect: Mood normal. Affect is tearful.        Speech: Speech normal.        Behavior: Behavior normal. Behavior is cooperative.        Thought Content: Thought content is not delusional. Thought content does not include homicidal or suicidal ideation. Thought content does not include homicidal or suicidal plan.        Judgment: Judgment normal.     ASSESSMENT/PLAN:   Depression PHQ9 score of 19  Refilled Zoloft prescription  Prescribed PRN Buspar Recommended establishing care with therapist  for talk therapy    F/u in 2 weeks for mood check   , MD [facility]

## 2022-03-31 ENCOUNTER — Encounter: Payer: Self-pay | Admitting: Family Medicine

## 2022-03-31 ENCOUNTER — Ambulatory Visit (INDEPENDENT_AMBULATORY_CARE_PROVIDER_SITE_OTHER): Payer: Medicaid Other | Admitting: Family Medicine

## 2022-03-31 VITALS — BP 131/61 | HR 86 | Ht 63.0 in | Wt 266.1 lb

## 2022-03-31 DIAGNOSIS — F329 Major depressive disorder, single episode, unspecified: Secondary | ICD-10-CM

## 2022-03-31 MED ORDER — BUSPIRONE HCL 5 MG PO TABS: 5.0000 mg | ORAL_TABLET | Freq: Three times a day (TID) | ORAL | 0 refills | Status: DC

## 2022-03-31 MED ORDER — SERTRALINE HCL 100 MG PO TABS: 100.0000 mg | ORAL_TABLET | Freq: Every day | ORAL | 0 refills | Status: DC

## 2022-03-31 NOTE — Telephone Encounter (Signed)
Form left up front for pick up.   Copy was made for batch scanning.   Please inform patient if she calls.

## 2022-03-31 NOTE — Patient Instructions (Addendum)
Please continue your Zoloft and the new medication Buspar to take as needed.   Please follow up in 2 weeks.     Therapy and Counseling Resources Most providers on this list will take Medicaid. Patients with commercial insurance or Medicare should contact their insurance company to get a list of in network providers.  [facility] (spanish speaking therapist available)(habla espanol)(take medicare and medicaid)  [place], [place], [place] [address], [place] [email or web address] [phone]  [facility] [address] [place], [place] [address] [place], [place], [place] [address]      [phone]  [place] (spanish available) [place], [place] [address] [place] (take [facility] and medicare) [address], [place],  [address]       [phone]     [place] (virtual only) [phone]  [facility] [address] [place], [place]  [facility]:  [place]. [place] [phone]  [facility]:  [place] [address] [place] [phone]  [facility] (under & uninsured) [address] [place] [phone]    [email or web address]    [place] [address]  [place]    [phone]  [facility]     Phone:  [phone]     [facility] [place]  [place] [address]      [place], [place] ext [place]: [place], [place], [place]   [place] ([place] therapist) [email or web address]  [place] [address]   [place] [address]    [phone]    [facility]    [address],  [place], [place]   [place]  [place]  [facility]  [address] [place], [place]        [phone]  Open Access/Walk In Clinic under & uninsured  [facility]  [address] [place], [place] Crisis [phone]  [facility] [place],  ([place])   [place], [place]: [phone]) 8:30 - 12; 1 - 2:30  [facility] [place],  [place], [place]    ([phone]):8:30 - 12; 2 - 3PM  [facility] [employer],  [address]  [place]; [phone]):   Mon - Fri 8 AM - 5 PM  [facility] [place]  MWF 12:30 to 3:00 or call to schedule an appointment  [phone]  Specific Provider options Psychology Today  [email or web address] click on find a therapist  enter your zip code left side and select or tailor a therapist for your specific need.   [facility] Provider Directory [email or web address]  (Medicaid)   Follow all drop down to find a provider  [place] or [email or web address] [address], [place], [place] Recovery support and educational   24- Hour Availability:   [facility]  [address] [place], [place] Crisis [place] [phone]  [facility]  Service  [place]  [phone] (after hours)  Therapeutic Alternative/Mobile Crisis   [phone]  [place] National Suicide Hotline  4792989587 [place]am[place]ina Monks)  Call 911 or go to emergency room  [facility]  [phone]);  [place] and [employer]  [phone]); [place], [place], [place], [place],  [place], [place], [name]

## 2022-04-02 NOTE — Assessment & Plan Note (Signed)
PHQ9 score of 19  Refilled Zoloft prescription  Prescribed PRN Buspar Recommended establishing care with therapist for talk therapy

## 2022-04-09 ENCOUNTER — Ambulatory Visit: Payer: Medicaid - Out of State

## 2022-04-11 ENCOUNTER — Ambulatory Visit: Payer: Medicaid - Out of State | Admitting: Family Medicine

## 2022-04-11 NOTE — Progress Notes (Deleted)
    SUBJECTIVE:   CHIEF COMPLAINT / HPI: mood f/u   Patient presents for mood follow up  She reports daily adherence with Zoloft and was also prescribed buspar PRN  She states that she uses the PRN medication ***  She reports that her mood is ***  She denies SI/HI  She denies AV hallucinations  She has not *** established with a therapist    PERTINENT  PMH / PSH:  COPD BPPV  Sciatica  Depression  Insomnia   OBJECTIVE:   There were no vitals taken for this visit.  Physical Exam Psychiatric:        Attention and Perception: Attention normal.        Mood and Affect: Mood and affect normal.        Speech: Speech normal.        Behavior: Behavior normal. Behavior is cooperative.        Thought Content: Thought content normal.        Cognition and Memory: Cognition and memory normal.        Judgment: Judgment normal.    ASSESSMENT/PLAN:   No problem-specific Assessment & Plan notes found for this encounter.     Ronnald Ramp, MD [facility]

## 2022-05-15 ENCOUNTER — Emergency Department (HOSPITAL_COMMUNITY)
Admission: EM | Admit: 2022-05-15 | Discharge: 2022-05-16 | Discharge status: Against Medical Advice | Payer: Medicaid Other | Attending: Emergency Medicine | Admitting: Emergency Medicine

## 2022-05-15 ENCOUNTER — Encounter (HOSPITAL_COMMUNITY): Payer: Self-pay | Admitting: Emergency Medicine

## 2022-05-15 ENCOUNTER — Other Ambulatory Visit: Payer: Self-pay

## 2022-05-15 ENCOUNTER — Emergency Department (HOSPITAL_COMMUNITY): Payer: Medicaid Other

## 2022-05-15 DIAGNOSIS — R0602 Shortness of breath: Secondary | ICD-10-CM | POA: Diagnosis not present

## 2022-05-15 DIAGNOSIS — R42 Dizziness and giddiness: Secondary | ICD-10-CM | POA: Insufficient documentation

## 2022-05-15 DIAGNOSIS — R0781 Pleurodynia: Secondary | ICD-10-CM | POA: Diagnosis not present

## 2022-05-15 DIAGNOSIS — R0789 Other chest pain: Secondary | ICD-10-CM | POA: Diagnosis not present

## 2022-05-15 DIAGNOSIS — J449 Chronic obstructive pulmonary disease, unspecified: Secondary | ICD-10-CM | POA: Diagnosis not present

## 2022-05-15 DIAGNOSIS — Z5321 Procedure and treatment not carried out due to patient leaving prior to being seen by health care provider: Secondary | ICD-10-CM | POA: Diagnosis not present

## 2022-05-15 DIAGNOSIS — R079 Chest pain, unspecified: Secondary | ICD-10-CM | POA: Diagnosis not present

## 2022-05-15 LAB — CBC WITH DIFFERENTIAL/PLATELET
Abs Immature Granulocytes: 0.03 10*3/uL (ref 0.00–0.07)
Basophils Absolute: 0 10*3/uL (ref 0.0–0.1)
Basophils Relative: 0 %
Eosinophils Absolute: 0.1 10*3/uL (ref 0.0–0.5)
Eosinophils Relative: 1 %
HCT: 44.3 % (ref 36.0–46.0)
Hemoglobin: 14.8 g/dL (ref 12.0–15.0)
Immature Granulocytes: 0 %
Lymphocytes Relative: 34 %
Lymphs Abs: 3.3 10*3/uL (ref 0.7–4.0)
MCH: 27.9 pg (ref 26.0–34.0)
MCHC: 33.4 g/dL (ref 30.0–36.0)
MCV: 83.4 fL (ref 80.0–100.0)
Monocytes Absolute: 0.5 10*3/uL (ref 0.1–1.0)
Monocytes Relative: 6 %
Neutro Abs: 5.6 10*3/uL (ref 1.7–7.7)
Neutrophils Relative %: 59 %
Platelets: 256 10*3/uL (ref 150–400)
RBC: 5.31 MIL/uL — ABNORMAL HIGH (ref 3.87–5.11)
RDW: 13.6 % (ref 11.5–15.5)
WBC: 9.6 10*3/uL (ref 4.0–10.5)
nRBC: 0 % (ref 0.0–0.2)

## 2022-05-15 LAB — BASIC METABOLIC PANEL
Anion gap: 8 (ref 5–15)
BUN: 14 mg/dL (ref 6–20)
CO2: 24 mmol/L (ref 22–32)
Calcium: 9 mg/dL (ref 8.9–10.3)
Chloride: 107 mmol/L (ref 98–111)
Creatinine, Ser: 0.83 mg/dL (ref 0.44–1.00)
GFR, Estimated: >60 mL/min (ref >60)
Glucose, Bld: 119 mg/dL — ABNORMAL HIGH (ref 70–99)
Potassium: 3.8 mmol/L (ref 3.5–5.1)
Sodium: 139 mmol/L (ref 135–145)

## 2022-05-15 LAB — TROPONIN I (HIGH SENSITIVITY): Troponin I (High Sensitivity): 4 ng/L (ref <18)

## 2022-05-15 NOTE — ED Provider Triage Note (Signed)
Emergency Medicine Provider Triage Evaluation Note  Kylie Garner , a 61 y.o. female  was evaluated in triage.  Pt complains of chest pain ongoing for couple weeks.  Started when she stopped vaping.  Endorses shortness of breath.  Pain is pleuritic.  Also endorses dizziness.  States this is similar to when she had an inner ear problem a year ago.  Review of Systems  Positive: As above  Negative: As above  Physical Exam  BP (!) 147/106   Pulse 92   Temp 97.8 F (36.6 C) (Oral)   Resp 16   SpO2 97%  Gen:   Awake, no distress   Resp:  Normal effort  MSK:   Moves extremities without difficulty  Other:    Medical Decision Making  Medically screening exam initiated at 9:48 PM.  Appropriate orders placed.  Kylie Garner was informed that the remainder of the evaluation will be completed by another provider, this initial triage assessment does not replace that evaluation, and the importance of remaining in the ED until their evaluation is complete.     [name], PA-C 05/15/22 2149

## 2022-05-15 NOTE — ED Triage Notes (Addendum)
Pt reported to ED with c/o of dizziness x2 days. Pt states she had similar event one year ago and is caused by her ears. Pt also endorses shortness of breath and has COPD, states she started vaping 4 weeks ago and has had pain in "back of her right lung".

## 2022-05-16 NOTE — ED Notes (Signed)
Patient LWBS, despite being advised not to, dragging OTF.

## 2022-05-27 ENCOUNTER — Other Ambulatory Visit: Payer: Self-pay

## 2022-05-27 NOTE — Telephone Encounter (Signed)
Patient calls nurse line requesting medication refills. She is also requesting refill on sleeping medication, she is unsure of the name.   This is not on current medication list. Advised patient that she would need an appointment to discuss sleep medication.   Patient scheduled for next Wednesday with Dr. Larita Fife, as PCP was not available until the end of the month.   Veronda Prude, RN

## 2022-05-28 MED ORDER — BUSPIRONE HCL 5 MG PO TABS
5.0000 mg | ORAL_TABLET | Freq: Three times a day (TID) | ORAL | 0 refills | Status: DC
Start: 2022-05-28 — End: 2022-08-05

## 2022-05-28 MED ORDER — SERTRALINE HCL 100 MG PO TABS
100.0000 mg | ORAL_TABLET | Freq: Every day | ORAL | 0 refills | Status: DC
Start: 2022-05-28 — End: 2022-08-05

## 2022-06-02 NOTE — Progress Notes (Unsigned)
SUBJECTIVE:   CHIEF COMPLAINT / HPI:   Sleep difficulties Kylie Garner is a pleasant 61 year old woman who presents today to discuss her obstructive sleep apnea and difficulty sleeping.  She believes she was diagnosed with obstructive sleep apnea several years ago, chart review indicates likely 2017.  She had a CPAP machine which she used nightly consistently with great results; she would wake up feeling rested and refreshed.  After her mom's passing a couple years ago, she was forced to "leave everything behind" and subsequently lost her CPAP machine.  In those couple of years, she has developed worsened sleep.  She reports going to bed at 10 PM despite not feeling tired, taking approximately 2 hours to fall asleep, and then waking up at 3 AM.  She does not feel tired when she wakes up, she actually wakes up feeling pretty good.  She reports trying to nap during the day without success.  She also notices that she does not wake up gasping for air now that she does not have her CPAP machine.  Caffeine-only coffee in the morning, no other caffeine including tea or soda Tobacco-smokes one half of a cigarette in the morning with her coffee and saves the other half for the next day.  No tobacco use in the 12 hours prior to bed. EtOH-none Illicit substances-none, no cocaine, meth, marijuana  PERTINENT  PMH / PSH: Obstructive sleep apnea, morbid obesity, insomnia prediabetes, depression  OBJECTIVE:   BP 119/67   Pulse 70   Ht 5\' 3"  (1.6 m)   Wt 260 lb 6.4 oz (118.1 kg)   SpO2 96%   BMI 46.13 kg/m    PHQ-9:     06/04/2022    8:40 AM 03/31/2022    3:20 PM 03/14/2022   10:27 AM  Depression screen PHQ 2/9  Decreased Interest 0 1 1  Down, Depressed, Hopeless 1 3 1   PHQ - 2 Score 1 4 2   Altered sleeping 3 2 3   Tired, decreased energy 2 1 3   Change in appetite 0 2 2  Feeling bad or failure about yourself  3 3 1   Trouble concentrating 0 2 1  Moving slowly or fidgety/restless 0 3 2   Suicidal thoughts 0 2 0  PHQ-9 Score 9 19 14   Difficult doing work/chores  Somewhat difficult Very difficult    GAD-7:     03/31/2022    3:19 PM 06/16/2016    9:13 AM  GAD 7 : Generalized Anxiety Score  Nervous, Anxious, on Edge 0 0  Control/stop worrying 1 0  Worry too much - different things 2 0  Trouble relaxing 0 0  Restless 2 0  Easily annoyed or irritable 0 0  Afraid - awful might happen 0 0  Total GAD 7 Score 5 0  Anxiety Difficulty Somewhat difficult Not difficult at all   Physical Exam General: Awake, alert, oriented HEENT: Obese neck habitus, neck supple, thyroid unremarkable to palpation Cardiovascular: Regular rate and rhythm, S1 and S2 present, no murmurs auscultated Respiratory: Anterior lung fields with expiratory wheezing bilaterally, posterior lung fields clear to auscultation bilaterally  ASSESSMENT/PLAN:   OSA (obstructive sleep apnea) Chronic worsening chronic problem without CPAP machine.  Given remote diagnosis and no use of CPAP in the last 2 years, will refer to sleep medicine clinic for updated sleep study and CPAP settings.  Based on the conversation, I do not believe caffeine or substances are contributing to the lack of sleep.  Reviewed medications, no activating  medications on list.  Labs up-to-date, no gross abnormalities.  Last thyroid check 2017, however no other signs or symptoms of hyperthyroidism present; I do not believe we need to check at this time given a more likely explanation for her sleep difficulties. Provided conservative sleep hygiene measures in the AVS, but I believe the CPAP may provide the best relief.     Fayette Pho, MD [facility]

## 2022-06-04 ENCOUNTER — Ambulatory Visit (INDEPENDENT_AMBULATORY_CARE_PROVIDER_SITE_OTHER): Payer: Medicaid Other | Admitting: Family Medicine

## 2022-06-04 ENCOUNTER — Encounter: Payer: Self-pay | Admitting: Family Medicine

## 2022-06-04 VITALS — BP 119/67 | HR 70 | Ht 63.0 in | Wt 260.4 lb

## 2022-06-04 DIAGNOSIS — G4733 Obstructive sleep apnea (adult) (pediatric): Secondary | ICD-10-CM | POA: Diagnosis present

## 2022-06-04 DIAGNOSIS — G479 Sleep disorder, unspecified: Secondary | ICD-10-CM | POA: Diagnosis not present

## 2022-06-04 NOTE — Assessment & Plan Note (Addendum)
Chronic worsening chronic problem without CPAP machine.  Given remote diagnosis and no use of CPAP in the last 2 years, will refer to sleep medicine clinic for updated sleep study and CPAP settings.  Based on the conversation, I do not believe caffeine or substances are contributing to the lack of sleep.  Reviewed medications, no activating medications on list.  Labs up-to-date, no gross abnormalities.  Last thyroid check 2017, however no other signs or symptoms of hyperthyroidism present; I do not believe we need to check at this time given a more likely explanation for her sleep difficulties. Provided conservative sleep hygiene measures in the AVS, but I believe the CPAP may provide the best relief.

## 2022-06-04 NOTE — Patient Instructions (Addendum)
It was wonderful to meet you today. Thank you for allowing me to be a part of your care. Below is a short summary of what we discussed at your visit today:  Insomnia Today we discussed your difficulty sleeping. I recommend two things to start: melatonin and a regular wake up time.  I will place the referral to the sleep medicine clinic for CPAP testing. Someone from their office should be calling you in 1 to 2 weeks to schedule an appointment.  If you do not hear from them, let us know. We may need to nudge along the referral.   Melatonin: Take 5 mg of over-the counter melatonin about 30 minutes before bed. I recommend taking it before you move to the bedroom to put on pajamas and brush your teeth.   Regular wake up time: Wake up at the same time every day and do not take day time naps.   See below for more tips and web sites with reliable information for you.   Health Maintenance We like to think about ways to keep you healthy for years to come. Below are some interventions and screenings we can offer to keep you healthy: - PAP smear (please schedule with [place] at your earliest convenience) - COVID and Shingles vaccines (may be obtained at your favorite pharmacy) - Flu vaccine (check back in with [place] in early September about availability)  If you have any questions or concerns, please do not hesitate to contact us via phone or MyChart message.   Fayette Pho, MD   CDC tips for better sleep CityPerson.tn     American Society of Sleep Medicine  https://sleepeducation.org/healthy-sleep/healthy-sleep-habits/    Healthy Sleep Habits Your behaviors during the day, and especially before bedtime, can have a major impact on your sleep. They can promote healthy sleep or contribute to sleeplessness.  Your daily routines - what you eat and drink, the medications you take, how you schedule your days and how you choose to spend your evenings - can  significantly impact your quality of sleep. Even a few slight adjustments can, in some cases, mean the difference between sound sleep and a restless night.  The term "sleep hygiene" refers to a series of healthy sleep habits that can improve your ability to fall asleep and stay asleep. These habits can help improve your sleep health.  When people struggle with insomnia, sleep hygiene is an important part of cognitive behavioral therapy (CBT), the most effective long-term treatment for people with chronic insomnia. CBT for insomnia can help you address the thoughts and behaviors that prevent you from sleeping well. It also includes techniques for stress reduction, relaxation and sleep schedule management.  Quick sleep tips Keep a consistent sleep schedule. Get up at the same time every day, even on weekends or during vacations. Set a bedtime that is early enough for you to get at least 7-8 hours of sleep. Don't go to bed unless you are sleepy. If you don't fall asleep after 20 minutes, get out of bed. Go do a quiet activity without a lot of light exposure. It is especially important to not get on electronics. Establish a relaxing bedtime routine. Use your bed only for sleep and sex. Make your bedroom quiet and relaxing. Keep the room at a comfortable, cool temperature. Limit exposure to bright light in the evenings. Turn off electronic devices at least 30 minutes before bedtime. Don't eat a large meal before bedtime. If you are hungry at night, eat a light, healthy  snack. Exercise regularly and maintain a healthy diet. Avoid consuming caffeine in the afternoon or evening. Avoid consuming alcohol before bedtime. Reduce your fluid intake before bedtime.

## 2022-08-05 ENCOUNTER — Inpatient Hospital Stay (HOSPITAL_COMMUNITY)
Admission: EM | Admit: 2022-08-05 | Discharge: 2022-08-07 | DRG: 193 | Disposition: A | Discharge status: Home / Self Care | Payer: Medicaid Other | Attending: Family Medicine | Admitting: Family Medicine

## 2022-08-05 ENCOUNTER — Encounter: Payer: Self-pay | Admitting: Student

## 2022-08-05 ENCOUNTER — Encounter (HOSPITAL_COMMUNITY): Payer: Self-pay

## 2022-08-05 ENCOUNTER — Emergency Department (HOSPITAL_COMMUNITY): Payer: Medicaid Other

## 2022-08-05 ENCOUNTER — Ambulatory Visit (INDEPENDENT_AMBULATORY_CARE_PROVIDER_SITE_OTHER): Payer: Medicaid Other | Admitting: Student

## 2022-08-05 ENCOUNTER — Other Ambulatory Visit: Payer: Self-pay

## 2022-08-05 VITALS — BP 120/63 | HR 88 | Temp 98.2°F | Ht 63.0 in | Wt 251.6 lb

## 2022-08-05 DIAGNOSIS — Z79899 Other long term (current) drug therapy: Secondary | ICD-10-CM

## 2022-08-05 DIAGNOSIS — R0902 Hypoxemia: Secondary | ICD-10-CM

## 2022-08-05 DIAGNOSIS — J9601 Acute respiratory failure with hypoxia: Secondary | ICD-10-CM | POA: Diagnosis not present

## 2022-08-05 DIAGNOSIS — Z801 Family history of malignant neoplasm of trachea, bronchus and lung: Secondary | ICD-10-CM

## 2022-08-05 DIAGNOSIS — F32A Depression, unspecified: Secondary | ICD-10-CM | POA: Diagnosis present

## 2022-08-05 DIAGNOSIS — J189 Pneumonia, unspecified organism: Principal | ICD-10-CM | POA: Diagnosis present

## 2022-08-05 DIAGNOSIS — J188 Other pneumonia, unspecified organism: Secondary | ICD-10-CM | POA: Diagnosis present

## 2022-08-05 DIAGNOSIS — I10 Essential (primary) hypertension: Secondary | ICD-10-CM | POA: Diagnosis present

## 2022-08-05 DIAGNOSIS — F329 Major depressive disorder, single episode, unspecified: Secondary | ICD-10-CM

## 2022-08-05 DIAGNOSIS — Z6841 Body Mass Index (BMI) 40.0 and over, adult: Secondary | ICD-10-CM

## 2022-08-05 DIAGNOSIS — R059 Cough, unspecified: Secondary | ICD-10-CM | POA: Diagnosis not present

## 2022-08-05 DIAGNOSIS — E669 Obesity, unspecified: Secondary | ICD-10-CM | POA: Diagnosis present

## 2022-08-05 DIAGNOSIS — Z87891 Personal history of nicotine dependence: Secondary | ICD-10-CM

## 2022-08-05 DIAGNOSIS — R0602 Shortness of breath: Secondary | ICD-10-CM | POA: Diagnosis not present

## 2022-08-05 DIAGNOSIS — Z7952 Long term (current) use of systemic steroids: Secondary | ICD-10-CM

## 2022-08-05 DIAGNOSIS — Z8249 Family history of ischemic heart disease and other diseases of the circulatory system: Secondary | ICD-10-CM

## 2022-08-05 DIAGNOSIS — F419 Anxiety disorder, unspecified: Secondary | ICD-10-CM | POA: Diagnosis present

## 2022-08-05 DIAGNOSIS — J441 Chronic obstructive pulmonary disease with (acute) exacerbation: Secondary | ICD-10-CM

## 2022-08-05 DIAGNOSIS — Z1152 Encounter for screening for COVID-19: Secondary | ICD-10-CM

## 2022-08-05 DIAGNOSIS — R7303 Prediabetes: Secondary | ICD-10-CM | POA: Diagnosis present

## 2022-08-05 DIAGNOSIS — J440 Chronic obstructive pulmonary disease with acute lower respiratory infection: Secondary | ICD-10-CM | POA: Diagnosis present

## 2022-08-05 LAB — BASIC METABOLIC PANEL
Anion gap: 8 (ref 5–15)
BUN: 10 mg/dL (ref 6–20)
CO2: 29 mmol/L (ref 22–32)
Calcium: 9.1 mg/dL (ref 8.9–10.3)
Chloride: 101 mmol/L (ref 98–111)
Creatinine, Ser: 0.76 mg/dL (ref 0.44–1.00)
GFR, Estimated: >60 mL/min (ref >60)
Glucose, Bld: 109 mg/dL — ABNORMAL HIGH (ref 70–99)
Potassium: 3.9 mmol/L (ref 3.5–5.1)
Sodium: 138 mmol/L (ref 135–145)

## 2022-08-05 LAB — CBC WITH DIFFERENTIAL/PLATELET
Abs Immature Granulocytes: 0.09 10*3/uL — ABNORMAL HIGH (ref 0.00–0.07)
Basophils Absolute: 0.1 10*3/uL (ref 0.0–0.1)
Basophils Relative: 0 %
Eosinophils Absolute: 0.2 10*3/uL (ref 0.0–0.5)
Eosinophils Relative: 2 %
HCT: 40.2 % (ref 36.0–46.0)
Hemoglobin: 13.3 g/dL (ref 12.0–15.0)
Immature Granulocytes: 1 %
Lymphocytes Relative: 16 %
Lymphs Abs: 1.9 10*3/uL (ref 0.7–4.0)
MCH: 28.1 pg (ref 26.0–34.0)
MCHC: 33.1 g/dL (ref 30.0–36.0)
MCV: 84.8 fL (ref 80.0–100.0)
Monocytes Absolute: 0.7 10*3/uL (ref 0.1–1.0)
Monocytes Relative: 6 %
Neutro Abs: 9.3 10*3/uL — ABNORMAL HIGH (ref 1.7–7.7)
Neutrophils Relative %: 75 %
Platelets: 319 10*3/uL (ref 150–400)
RBC: 4.74 MIL/uL (ref 3.87–5.11)
RDW: 13.1 % (ref 11.5–15.5)
WBC: 12.3 10*3/uL — ABNORMAL HIGH (ref 4.0–10.5)
nRBC: 0 % (ref 0.0–0.2)

## 2022-08-05 LAB — RESP PANEL BY RT-PCR (FLU A&B, COVID) ARPGX2
Influenza A by PCR: NEGATIVE
Influenza B by PCR: NEGATIVE
SARS Coronavirus 2 by RT PCR: NEGATIVE

## 2022-08-05 MED ORDER — IPRATROPIUM-ALBUTEROL 0.5-2.5 (3) MG/3ML IN SOLN: 3.0000 mL | Freq: Four times a day (QID) | RESPIRATORY_TRACT | Status: DC | PRN

## 2022-08-05 MED ORDER — SODIUM CHLORIDE 0.9 % IV SOLN
500.0000 mg | Freq: Once | INTRAVENOUS | Status: AC
  Administered 2022-08-05: 500 mg via INTRAVENOUS
  Filled 2022-08-05: qty 5

## 2022-08-05 MED ORDER — IPRATROPIUM-ALBUTEROL 0.5-2.5 (3) MG/3ML IN SOLN: 3.0000 mL | Freq: Once | RESPIRATORY_TRACT | Status: DC

## 2022-08-05 MED ORDER — IPRATROPIUM BROMIDE 0.02 % IN SOLN
0.5000 mg | Freq: Once | RESPIRATORY_TRACT | Status: AC
  Administered 2022-08-05: 0.5 mg via RESPIRATORY_TRACT

## 2022-08-05 MED ORDER — IPRATROPIUM-ALBUTEROL 0.5-2.5 (3) MG/3ML IN SOLN
3.0000 mL | RESPIRATORY_TRACT | Status: DC | PRN
Start: 2022-08-05 — End: 2022-08-07

## 2022-08-05 MED ORDER — METHYLPREDNISOLONE SODIUM SUCC 125 MG IJ SOLR
125.0000 mg | Freq: Once | INTRAMUSCULAR | Status: AC
  Administered 2022-08-05: 125 mg via INTRAVENOUS
  Filled 2022-08-05: qty 2

## 2022-08-05 MED ORDER — PREDNISONE 20 MG PO TABS: 40.0000 mg | ORAL_TABLET | Freq: Every day | ORAL | 0 refills | Status: DC

## 2022-08-05 MED ORDER — ACETAMINOPHEN 650 MG RE SUPP: 650.0000 mg | Freq: Four times a day (QID) | RECTAL | Status: DC | PRN

## 2022-08-05 MED ORDER — UMECLIDINIUM BROMIDE 62.5 MCG/ACT IN AEPB
1.0000 | INHALATION_SPRAY | Freq: Every day | RESPIRATORY_TRACT | Status: DC
Start: 2022-08-06 — End: 2022-08-07
  Filled 2022-08-05 (×2): qty 7

## 2022-08-05 MED ORDER — BUSPIRONE HCL 5 MG PO TABS: 5.0000 mg | ORAL_TABLET | Freq: Three times a day (TID) | ORAL | 0 refills | Status: DC

## 2022-08-05 MED ORDER — AZITHROMYCIN 250 MG PO TABS: ORAL_TABLET | ORAL | 0 refills | Status: DC

## 2022-08-05 MED ORDER — UMECLIDINIUM BROMIDE 62.5 MCG/ACT IN AEPB
1.0000 | INHALATION_SPRAY | Freq: Every day | RESPIRATORY_TRACT | 1 refills | Status: DC
Start: 2022-08-05 — End: 2022-08-07

## 2022-08-05 MED ORDER — SERTRALINE HCL 100 MG PO TABS
100.0000 mg | ORAL_TABLET | Freq: Every day | ORAL | Status: DC
  Administered 2022-08-06 – 2022-08-07 (×2): 100 mg via ORAL
  Filled 2022-08-05 (×2): qty 1

## 2022-08-05 MED ORDER — SODIUM CHLORIDE 0.9 % IV SOLN
1.0000 g | INTRAVENOUS | Status: DC
  Administered 2022-08-06: 1 g via INTRAVENOUS
  Filled 2022-08-05: qty 10

## 2022-08-05 MED ORDER — SODIUM CHLORIDE 0.9 % IV SOLN: 500.0000 mg | INTRAVENOUS | Status: DC

## 2022-08-05 MED ORDER — ACETAMINOPHEN 325 MG PO TABS: 650.0000 mg | ORAL_TABLET | Freq: Four times a day (QID) | ORAL | Status: DC | PRN

## 2022-08-05 MED ORDER — ENOXAPARIN SODIUM 40 MG/0.4ML IJ SOSY
40.0000 mg | PREFILLED_SYRINGE | INTRAMUSCULAR | Status: DC
  Administered 2022-08-06: 40 mg via SUBCUTANEOUS
  Filled 2022-08-05: qty 0.4

## 2022-08-05 MED ORDER — BUSPIRONE HCL 10 MG PO TABS
5.0000 mg | ORAL_TABLET | Freq: Three times a day (TID) | ORAL | Status: DC
  Administered 2022-08-06 – 2022-08-07 (×3): 5 mg via ORAL
  Filled 2022-08-05 (×3): qty 1

## 2022-08-05 MED ORDER — ALBUTEROL SULFATE (2.5 MG/3ML) 0.083% IN NEBU
2.5000 mg | INHALATION_SOLUTION | Freq: Once | RESPIRATORY_TRACT | Status: AC
  Administered 2022-08-05: 2.5 mg via RESPIRATORY_TRACT

## 2022-08-05 MED ORDER — SERTRALINE HCL 100 MG PO TABS
100.0000 mg | ORAL_TABLET | Freq: Every day | ORAL | 0 refills | Status: DC
Start: 2022-08-05 — End: 2022-10-07

## 2022-08-05 MED ORDER — SODIUM CHLORIDE 0.9 % IV SOLN
1.0000 g | Freq: Once | INTRAVENOUS | Status: AC
  Administered 2022-08-05: 1 g via INTRAVENOUS
  Filled 2022-08-05: qty 10

## 2022-08-05 MED ORDER — PREDNISONE 20 MG PO TABS: 40.0000 mg | ORAL_TABLET | Freq: Every day | ORAL | Status: DC

## 2022-08-05 NOTE — ED Provider Triage Note (Signed)
Emergency Medicine Provider Triage Evaluation Note  Kylie Garner , a 61 y.o. female  was evaluated in triage.  Pt complains of shortness of breath x4 days.  Patient has a history of COPD.  Patient evaluated by PCP prior to arrival where she was given a DuoNeb treatment.  Patient found to be hypoxic and sent to the ED.  Review of Systems  Positive: SOB Negative: CP  Physical Exam  BP (!) 114/47 (BP Location: Right Arm)   Pulse 77   Temp 99 F (37.2 C) (Oral)   Resp (!) 22   Ht 5\' 3"  (1.6 m)   Wt 113.9 kg   SpO2 (!) 89%   BMI 44.46 kg/m  Gen:   Awake, no distress   Resp:  Normal effort  MSK:   Moves extremities without difficulty  Other:  Expiratory wheeze  Medical Decision Making  Medically screening exam initiated at 3:28 PM.  Appropriate orders placed.  Kylie Garner was informed that the remainder of the evaluation will be completed by another provider, this initial triage assessment does not replace that evaluation, and the importance of remaining in the ED until their evaluation is complete.  CXR Labs COVID test   Suzy Bouchard, PA-C 08/05/22 1529

## 2022-08-05 NOTE — Assessment & Plan Note (Signed)
Most likely secondary to COPD exacerbation.  However, given degree of hypoxia after ambulation despite DuoNeb treatment, will need to evaluate for other causes of hypoxia such as PE or pneumonia. -Will transfer patient to ED for further evaluation -Sent prednisone and azithromycin to patient's pharmacy -Would also initiate controller inhaler with umeclidinium if ED work-up is likewise consistent with COPD exacerbation

## 2022-08-05 NOTE — ED Provider Notes (Signed)
[facility] EMERGENCY DEPARTMENT Provider Note   CSN: [id number] Arrival date & time: 08/05/22  1439     History COPD, tobacco use Chief Complaint  Patient presents with   Shortness of Breath    [place] is a 61 y.o. female.  61 year old female with a past medical history of COPD presents to the ED sent in from PCPs office due to hypoxia.  Patient reports she has been sick with a URI over the past week, has had a wet cough with some clear sputum but has not been able to bring anything up.  She did try her inhaler, nebulizer treatment but there has not been any improvement.  She endorses shortness of breath has been worsening over the past week, states that she gets very winded with any sort of activity.  She stopped smoking tobacco approximately 2 weeks ago, had an appointment with her PCP today and was noted to be hypoxic with oxygen saturations in the 80s, she did have a nebulizer treatment which helped with the production of sputum, but reports no improvement in her shortness of breath.  She does not wear oxygen at baseline, however was placed on 2 L via nasal cannula.  Endorses a fever at home with a Tmax of 101.  Denies any chest pain, leg swelling, prior history of blood clots.    The history is provided by the patient.  Shortness of Breath Severity:  Severe Onset quality:  Sudden Duration:  1 week Timing:  Constant Ineffective treatments:  Inhaler and lying down Associated symptoms: cough and fever   Associated symptoms: no abdominal pain, no chest pain, no sore throat and no vomiting   Risk factors: obesity and tobacco use   Risk factors: no recent alcohol use, no hx of cancer and no recent surgery        Home Medications Prior to Admission medications   Medication Sig Start Date End Date Taking? Authorizing Provider  albuterol (PROAIR HFA) 108 (90 Base) MCG/ACT inhaler Inhale 1-2 puffs into the lungs every 6 (six) hours as needed for wheezing  or shortness of breath. 05/05/19   Bonnita Hollow, MD  azithromycin (ZITHROMAX) 250 MG tablet Take two tablets on day one and one tablet for the next four days for a total of five days 08/05/22   Eppie Gibson, MD  busPIRone (BUSPAR) 5 MG tablet Take 1 tablet (5 mg total) by mouth 3 (three) times daily. 08/05/22   Eppie Gibson, MD  predniSONE (DELTASONE) 20 MG tablet Take 2 tablets (40 mg total) by mouth daily with breakfast for 5 days. 08/05/22 08/10/22  Eppie Gibson, MD  sertraline (ZOLOFT) 100 MG tablet Take 1 tablet (100 mg total) by mouth daily. 08/05/22   Eppie Gibson, MD  Spacer/Aero-Holding Chambers (AEROCHAMBER PLUS FLO-VU LARGE) MISC 1 each by Other route once. 11/14/15   Olam Idler, MD  umeclidinium bromide (INCRUSE ELLIPTA) 62.5 MCG/ACT AEPB Inhale 1 puff into the lungs daily. 08/05/22   Eppie Gibson, MD      Allergies    Patient has no known allergies.    Review of Systems   Review of Systems  Constitutional:  Positive for fever. Negative for chills.  HENT:  Negative for sore throat.   Respiratory:  Positive for cough, chest tightness and shortness of breath.   Cardiovascular:  Negative for chest pain and leg swelling.  Gastrointestinal:  Negative for abdominal pain, nausea and vomiting.  Musculoskeletal:  Negative  for back pain.  Neurological:  Negative for light-headedness and numbness.  All other systems reviewed and are negative.   Physical Exam Updated Vital Signs BP (!) 115/50 (BP Location: Left Arm)   Pulse 72   Temp 99 F (37.2 C) (Oral)   Resp 16   Ht 5\' 3"  (1.6 m)   Wt 113.9 kg   SpO2 97%   BMI 44.46 kg/m  Physical Exam Vitals and nursing note reviewed.  Constitutional:      Appearance: She is well-developed. She is obese.  HENT:     Head: Normocephalic.  Cardiovascular:     Rate and Rhythm: Normal rate.  Pulmonary:     Effort: Pulmonary effort is normal.     Breath sounds: Examination of the right-upper field reveals  wheezing. Examination of the right-middle field reveals wheezing. Examination of the left-lower field reveals wheezing. Wheezing present.  Chest:     Chest wall: No tenderness.  Abdominal:     Palpations: Abdomen is soft.     Tenderness: There is no abdominal tenderness.  Musculoskeletal:     Right lower leg: No edema.     Left lower leg: No edema.  Skin:    General: Skin is warm and dry.  Neurological:     Mental Status: She is alert and oriented to person, place, and time.     ED Results / Procedures / Treatments   Labs (all labs ordered are listed, but only abnormal results are displayed) Labs Reviewed  CBC WITH DIFFERENTIAL/PLATELET - Abnormal; Notable for the following components:      Result Value   WBC 12.3 (*)    Neutro Abs 9.3 (*)    Abs Immature Granulocytes 0.09 (*)    All other components within normal limits  BASIC METABOLIC PANEL - Abnormal; Notable for the following components:   Glucose, Bld 109 (*)    All other components within normal limits  RESP PANEL BY RT-PCR (FLU A&B, COVID) [id number]    EKG EKG Interpretation  Date/Time:  Tuesday August 05 2022 15:34:23 EDT Ventricular Rate:  76 PR Interval:  152 QRS Duration: 80 QT Interval:  384 QTC Calculation: 432 R Axis:   73 Text Interpretation: Normal sinus rhythm Low voltage QRS Borderline ECG When compared with ECG of 14-Apr-2014 08:39, No significant change since last tracing Confirmed by Dorie Rank [phone]) on 08/05/2022 7:56:35 PM  Radiology DG Chest 1 View  Result Date: 08/05/2022 CLINICAL DATA:  Shortness of breath and cough EXAM: CHEST  1 VIEW COMPARISON:  05/15/2022 FINDINGS: Cardiac size is within normal limits. New patchy infiltrates are seen in right lung. Left lung is essentially clear. There is no pleural effusion or pneumothorax. IMPRESSION: There are multiple new patchy alveolar infiltrates in right lung suggesting possible multifocal pneumonia. Electronically Signed   By: Elmer Picker  M.D.   On: 08/05/2022 16:06    Procedures Procedures    Medications Ordered in ED Medications  methylPREDNISolone sodium succinate (SOLU-MEDROL) 125 mg/2 mL injection 125 mg (has no administration in time range)  cefTRIAXone (ROCEPHIN) 1 g in sodium chloride 0.9 % 100 mL IVPB (has no administration in time range)  azithromycin (ZITHROMAX) 500 mg in sodium chloride 0.9 % 250 mL IVPB (has no administration in time range)    ED Course/ Medical Decision Making/ A&P                           Medical Decision Making Risk Prescription  drug management.   This patient presents to the ED for concern of SOB, this involves a number of treatment options, and is a complaint that carries with it a high risk of complications and morbidity.  The differential diagnosis includes viral infection, pneumonia versus PE.    Co morbidities: Discussed in HPI   Brief History:  Patient with prior history of COPD here with worsening shortness of breath over the past week, no relief despite nebulizer and inhaler.  She did stop tobacco use approximately 2 weeks ago.  She was seen at [facility] office and given a nebulizer treatment with no improvement in her symptoms.  Continues to be short of breath, placed on 2 L via nasal cannula.  EMR reviewed including pt PMHx, past surgical history and past visits to ER.   See HPI for more details   Lab Tests:  I ordered and independently interpreted labs.  The pertinent results include:    I personally reviewed all laboratory work and imaging. Metabolic panel without any acute abnormality specifically kidney function within normal limits and no significant electrolyte abnormalities. CBC without leukocytosis or significant anemia. Respiratory panel negative for influenza A, B or Covid 19 infection.   Imaging Studies:  Xray of the chest showed: There are multiple new patchy alveolar infiltrates in right lung  suggesting possible multifocal pneumonia.    Cardiac  Monitoring:  The patient was maintained on a cardiac monitor.  I personally viewed and interpreted the cardiac monitored which showed an underlying rhythm of: NSR EKG non-ischemic   Medicines ordered:  I ordered medication including solumedrol  for COPD exacerbation Reevaluation of the patient after these medicines showed that the patient stayed the same I have reviewed the patients home medicines and have made adjustments as needed  Reevaluation:  After the interventions noted above I re-evaluated patient and found that they have :stayed the same   Social Determinants of Health:  The patient's social determinants of health were a factor in the care of this patient    Problem List / ED Course:  Patient presents to the ED with a chief complaint of shortness of breath that is been ongoing for the past week.  Patient reports feeling with some upper respiratory symptoms over the last week with a fever of 101 orally.  She does endorse tobacco use but has stopped smoking approximately 2 weeks ago.  Having a wet cough, saw PCP today and was hypoxic at the office.  Does have a history of COPD, usually controlled with inhalers and nebulizer treatments. Labs are remarkable for leukocytosis of 12.3, hemoglobin is within her normal.  BMP with no electrolyte derangement, creatinine level is unremarkable.  Respiratory panel was negative for influenza, and COVID-19. During examination patient has significant wheezing throughout especially on the right upper and lower lung fields.  X-ray is consistent with multifocal pneumonia.  She is hypoxic without O2 and has a new oxygen requirement of 2 L via nasal cannula satting at 97%.  No tachycardia, continues to have a low-grade temp of 99. No recent immobilization, no prior history of blood clots, I do suspect so more infectious process versus pulmonary embolism.  Patient was given Solu-Medrol, antibiotics such as Rocephin, Zithromax to help with treatment  for community-acquired pneumonia.  Due to patient's new oxygen requirement I do suspect that she will need admission into the hospital.  Call placed to family practice at this time for admission.   Dispostion:  After consideration of the diagnostic results and the  patients response to treatment, I feel that the patent would benefit from admission for further management of pneumonia with new oxygen requirement.   Spoke to family medicine team who will admit patient for further management.     Portions of this note were generated with Lobby[employer]. Dictation errors may occur despite best attempts at proofreading.   Final Clinical Impression(s) / ED Diagnoses Final diagnoses:  SOB (shortness of breath)  Hypoxia    Rx / DC Orders ED Discharge Orders     None         Janeece Fitting, PA-C 08/05/22 Layla Maw, MD 08/06/22 [phone]

## 2022-08-05 NOTE — Assessment & Plan Note (Addendum)
Stable. O2 sats stable, currently on RA, no desats with ambulation. Continues to have expiratory wheezes. Can d/c today. -Continue azithromycin -Switch rocephin to cefdinir 300 mg BID for 3 more days outpatient -Continuous pulse ox

## 2022-08-05 NOTE — ED Triage Notes (Signed)
Sent by PCP for new oxygen requirement.  Sats in triage 87-89% RA placed on 2L [place].  Patient reports sob and cough with white phlegm x 1 week Hx COPD

## 2022-08-05 NOTE — Progress Notes (Signed)
    SUBJECTIVE:   CHIEF COMPLAINT / HPI:   Shortness of Breath  Patient presents with four days of increasing SOB and cough. She thinks she has also had an increase in sputum production though no change in character of the sputum. Reports an isolated fever ~2 days ago that was self-resolving. Has tried to treat symptoms with her albuterol inhaler without success.  He is not currently on a controller inhaler for her COPD.  On initial presentation to the office, SPO2 was 88% which rapidly improved to 91%. Patient received a DuoNeb in clinic and then we ambulated her in the office.  During ambulation SPO2 dropped to 81%.  Patient denied any lightheadedness or weakness on her feet.  OBJECTIVE:   BP 120/63   Pulse 88   Temp 98.2 F (36.8 C) (Oral)   Ht 5\' 3"  (1.6 m)   Wt 251 lb 9.6 oz (114.1 kg)   SpO2 (!) 81% Comment: after ambulating  BMI 44.57 kg/m   Physical Exam Vitals reviewed.  Constitutional:      General: She is not in acute distress. Cardiovascular:     Rate and Rhythm: Normal rate and regular rhythm.     Heart sounds: No murmur heard. Pulmonary:     Comments: Prolonged I/E ratio.  Diffuse expiratory wheeze throughout.  No foci of crackles on exam.  No increased work of breathing at rest. Skin:    General: Skin is warm and dry.  Neurological:     Mental Status: She is alert.      ASSESSMENT/PLAN:   Hypoxia Most likely secondary to COPD exacerbation.  However, given degree of hypoxia after ambulation despite DuoNeb treatment, will need to evaluate for other causes of hypoxia such as PE or pneumonia. -Will transfer patient to ED for further evaluation -Sent prednisone and azithromycin to patient's pharmacy -Would also initiate controller inhaler with umeclidinium if ED work-up is likewise consistent with COPD exacerbation     Pearla Dubonnet, MD [place]

## 2022-08-05 NOTE — Patient Instructions (Addendum)
Kylie Garner,  Here is a recap of what we talked about: It sounds like you are having a COPD exacerbation.  This could be caused by the changing of the seasons or a viral illness that tipped you over into exacerbation.  We are giving you a DuoNeb treatment while you are here in the clinic.  I am hopeful that this will help to open up your lungs.  Unfortunately, since your O2 levels dropped when we were walking you, despite the Duoneb, I think that you should go over to the ER to be evaluated for potential other causes of your shortness of breath like a pulmonary embolism or pneumonia.   I am sending you home with a couple of prescriptions: -Prednisone which is a steroid, you should take 2 tablets daily for the next 5 days -Azithromycin which is an antibiotic, you should take 2 tablets today and 1 tablet/day for the next 4 days for a total of 5 days -Incruse which is a inhaler, I am going to add this to your home inhaler regimen since your albuterol does not seem to be sufficiently controlling your symptoms.  This should be a daily inhaler for you for the foreseeable future. -I sent in some albuterol nebulizer vials to your pharmacy, our nursing staff will work towards getting a machine out to your house soon -If you are not feeling better in the next 3 or 4 days, please come back to see [place] -I refilled your Zoloft and Buspar   Pearla Dubonnet, MD

## 2022-08-05 NOTE — H&P (Incomplete)
Hospital Admission History and Physical Service Pager: [phone]  Patient name: Kylie Garner Medical record number: [id number] Date of Birth: [birth year] Age: 61 y.o. Gender: female  Primary Care Provider: Holley Bouche, MD Consultants: None Code Status: FULL CODE  Preferred Emergency Contact:  Contact Information     Name Relation Home Work Mobile   Aguallo,Katie Daughter   [phone]  2nd Contact: Gearldine Bienenstock [place]  Chief Complaint: Shortness of breath, cough   Assessment and Plan: ARIANN Garner is a 61 y.o. female presenting with cough and SOB with new oxygen requirement, with evidence of multifocal pneumonia on imaging and concurrent COPD exacerbation.  Management as below.  * Multifocal pneumonia Presents with productive cough (white/green mucus) and SOB x1 week.  Seen in clinic on the day of admission and found to have new oxygen requirement while ambulating (desaturations to low 80s).  No prior home oxygen requirement.  Chest x-ray in the ED consistent with multifocal pneumonia.  Leukocytosis to 12.3 with neutrophilia/left shift.  Given her new oxygen requirement and underlying COPD, feel that patient would benefit from admission for further monitoring and IV antibiotics. -Admit to FMTS, med surg, attending Dr. Owens Shark -Continue IV azithromycin, Rocephin -AM CBC/BMP -Continuous pulse ox  COPD exacerbation (HCC) History of COPD, only on home albuterol.  Currently has diffuse expiratory wheezing with prolonged expiratory phase on exam. Productive cough since Monday 10/2. Was prescribed Incruse Ellipta in clinic today.  New oxygen requirement as above.  Received IV Solu-Medrol in the ED. -Supplemental oxygen, wean as tolerated, goal oxygen saturation 88-92% -Duonebs every 6 hours -DuoNebs every 2 hours as needed for SOB, wheezing -Cardiac monitoring -Start Incruse Ellipta 1 puff daily -Continue azithromycin, Rocephin -Oral prednisone 40mg  x4  days -Ambulate with pulse ox prior to discharge -PT/OT   Chronic and stable:  Anxiety, depression: continue home Buspar, Zoloft PRN Tylenol for pain control  FEN/GI: Reg diet VTE Prophylaxis: Lovenox  Disposition: MedSurg  History of Present Illness:  Linday L Garner is a 61 y.o. female presenting with cough and SOB for the last week.   Patient reports that she has been feeling sick for the last week. She was seen in the [name] clinic earlier today and had a drop in her oxygen with ambulation so she was referred to the ED for further evaluation. She did receive a breathing treatment in the office prior to ED evaluation.   She reports that she has had a  productive white/green cough since last Monday and has felt short of breath with exertion. She reports two fevers. She has been staying in bed most of the time. She took NyQuil to help her sleep.   She has an inhaler at home that she uses. She has been needing to use it more frequently lately but was also afraid she may "overdose". She last used her inhaler last night.   Denies any N/V, headaches, vision changes. Endorses diarrhea since becoming sick. No dysuria, chest pain, lower extremity edema. Lately she endorses some orthopnea, "when I'm sick like this I have to sit up". Normally can lay flat. Does not use CPAP/BiPAP at night. She does not normally use oxygen.   No recent travel. No known tick exposures. No known sick contacts.   In the ED, patient desaturated to the 80s and was started on 2L [place]. Her CXR showed multifocal pneumonia. She was started on CTX, Azithromycin.   Review Of Systems: Per HPI   Pertinent Past Medical History: COPD  HTN  Depression Anxiety Prediabetes  Remainder reviewed in history tab.   Pertinent Past Surgical History: C-section x2 L Knee cartilage surgery   Appendectomy Remainder reviewed in history tab.  Pertinent Social History: Tobacco use: Former, quit 3 weeks ago. Previously smoking 1 pack per  day.  Alcohol use: Hasn't drank in 10 years Other Substance use: Denies Lives with nephew. Both parents have passed away. Brother lives in [place], they do not talk. Daughter lives in [place].   Pertinent Family History: Mother passed in February from lung cancer.  Father had MI.  Remainder reviewed in history tab.   Important Outpatient Medications: Albuterol inhaler  Buspar Sertraline Incruse Ellipta (rx today, has not started)  Remainder reviewed in medication history.   Objective: BP 103/68   Pulse 62   Temp 98 F (36.7 C)   Resp 19   Ht 5\' 3"  (1.6 m)   Wt 113.9 kg   SpO2 94%   BMI 44.46 kg/m  Exam: General: NAD, resting comfortably in bed, occasional dry cough Cardiovascular: RRR no MRG Respiratory: Diffuse expiratory wheezing in bilateral lung fields.  Prolonged expiratory phase.  No crackles noted Gastrointestinal: Obese abdomen, soft, NT/ND, normal BS MSK: No lower extremity pitting edema   Labs:  CBC BMET  Recent Labs  Lab 08/05/22 1543  WBC 12.3*  HGB 13.3  HCT 40.2  PLT 319   Recent Labs  Lab 08/05/22 1543  NA 138  K 3.9  CL 101  CO2 29  BUN 10  CREATININE 0.76  GLUCOSE 109*  CALCIUM 9.1    Neg flu/covid  EKG: NSR, low voltage QRS  Imaging Studies Performed: CXR IMPRESSION: There are multiple new patchy alveolar infiltrates in right lung suggesting possible multifocal pneumonia.  10/05/22, MD 08/06/2022, 12:16 AM PGY-1, [facility]  FPTS Intern pager: [phone], text pages welcome Secure chat group [facility] Teaching Service   FPTS Upper-Level Resident Addendum   I have independently interviewed and examined the patient. I have discussed the above with the original author and agree with their documentation. My edits for correction/addition/clarification are included where appropriate. Please see also any attending notes.   [facility], DO PGY-3, [facility] 08/06/2022  12:39 AM  FPTS Service pager: [phone] (text pages welcome through [facility])

## 2022-08-05 NOTE — Assessment & Plan Note (Addendum)
Stable. O2 sats and exam improving as above. Can likely d/c today. -Supplemental oxygen, wean as tolerated, goal oxygen saturation 88-92% -Duonebs every 6 hours -DuoNebs every 2 hours as needed for SOB, wheezing -D/c cardiac monitoring -Continue Incruse Ellipta 1 puff daily, azithromycin (day 3/3, switched to PO), cefdinir (day 1/8), oral prednisone 40mg  (day 2/4, s/p 1x methylprednisolone on admission) -Ambulate with pulse ox prior to discharge -OT with no follow up

## 2022-08-06 ENCOUNTER — Telehealth (HOSPITAL_COMMUNITY): Payer: Self-pay | Admitting: Pharmacy Technician

## 2022-08-06 ENCOUNTER — Encounter (HOSPITAL_COMMUNITY): Payer: Self-pay | Admitting: Family Medicine

## 2022-08-06 ENCOUNTER — Other Ambulatory Visit (HOSPITAL_COMMUNITY): Payer: Self-pay

## 2022-08-06 DIAGNOSIS — J440 Chronic obstructive pulmonary disease with acute lower respiratory infection: Secondary | ICD-10-CM | POA: Diagnosis not present

## 2022-08-06 DIAGNOSIS — R7303 Prediabetes: Secondary | ICD-10-CM | POA: Diagnosis not present

## 2022-08-06 DIAGNOSIS — R0602 Shortness of breath: Secondary | ICD-10-CM | POA: Diagnosis not present

## 2022-08-06 DIAGNOSIS — R059 Cough, unspecified: Secondary | ICD-10-CM | POA: Diagnosis not present

## 2022-08-06 DIAGNOSIS — J441 Chronic obstructive pulmonary disease with (acute) exacerbation: Secondary | ICD-10-CM | POA: Diagnosis not present

## 2022-08-06 DIAGNOSIS — I10 Essential (primary) hypertension: Secondary | ICD-10-CM | POA: Diagnosis not present

## 2022-08-06 DIAGNOSIS — F32A Depression, unspecified: Secondary | ICD-10-CM | POA: Diagnosis not present

## 2022-08-06 DIAGNOSIS — J189 Pneumonia, unspecified organism: Secondary | ICD-10-CM | POA: Diagnosis not present

## 2022-08-06 DIAGNOSIS — Z87891 Personal history of nicotine dependence: Secondary | ICD-10-CM | POA: Diagnosis not present

## 2022-08-06 DIAGNOSIS — J9601 Acute respiratory failure with hypoxia: Secondary | ICD-10-CM | POA: Insufficient documentation

## 2022-08-06 DIAGNOSIS — Z7952 Long term (current) use of systemic steroids: Secondary | ICD-10-CM | POA: Diagnosis not present

## 2022-08-06 DIAGNOSIS — F419 Anxiety disorder, unspecified: Secondary | ICD-10-CM | POA: Diagnosis not present

## 2022-08-06 DIAGNOSIS — Z1152 Encounter for screening for COVID-19: Secondary | ICD-10-CM | POA: Diagnosis not present

## 2022-08-06 DIAGNOSIS — Z8249 Family history of ischemic heart disease and other diseases of the circulatory system: Secondary | ICD-10-CM | POA: Diagnosis not present

## 2022-08-06 DIAGNOSIS — E669 Obesity, unspecified: Secondary | ICD-10-CM | POA: Diagnosis not present

## 2022-08-06 DIAGNOSIS — Z79899 Other long term (current) drug therapy: Secondary | ICD-10-CM | POA: Diagnosis not present

## 2022-08-06 DIAGNOSIS — Z6841 Body Mass Index (BMI) 40.0 and over, adult: Secondary | ICD-10-CM | POA: Diagnosis not present

## 2022-08-06 DIAGNOSIS — Z801 Family history of malignant neoplasm of trachea, bronchus and lung: Secondary | ICD-10-CM | POA: Diagnosis not present

## 2022-08-06 DIAGNOSIS — R0902 Hypoxemia: Secondary | ICD-10-CM | POA: Diagnosis not present

## 2022-08-06 LAB — BASIC METABOLIC PANEL
Anion gap: 9 (ref 5–15)
BUN: 10 mg/dL (ref 6–20)
CO2: 25 mmol/L (ref 22–32)
Calcium: 9 mg/dL (ref 8.9–10.3)
Chloride: 103 mmol/L (ref 98–111)
Creatinine, Ser: 0.67 mg/dL (ref 0.44–1.00)
GFR, Estimated: >60 mL/min (ref >60)
Glucose, Bld: 149 mg/dL — ABNORMAL HIGH (ref 70–99)
Potassium: 4.3 mmol/L (ref 3.5–5.1)
Sodium: 137 mmol/L (ref 135–145)

## 2022-08-06 LAB — CBC
HCT: 38.9 % (ref 36.0–46.0)
Hemoglobin: 13.1 g/dL (ref 12.0–15.0)
MCH: 28.1 pg (ref 26.0–34.0)
MCHC: 33.7 g/dL (ref 30.0–36.0)
MCV: 83.5 fL (ref 80.0–100.0)
Platelets: 308 10*3/uL (ref 150–400)
RBC: 4.66 MIL/uL (ref 3.87–5.11)
RDW: 13.1 % (ref 11.5–15.5)
WBC: 10.2 10*3/uL (ref 4.0–10.5)
nRBC: 0 % (ref 0.0–0.2)

## 2022-08-06 MED ORDER — AZITHROMYCIN 500 MG PO TABS
500.0000 mg | ORAL_TABLET | Freq: Every day | ORAL | Status: AC
  Administered 2022-08-06 – 2022-08-07 (×2): 500 mg via ORAL
  Filled 2022-08-06: qty 1
  Filled 2022-08-06: qty 2

## 2022-08-06 MED ORDER — PREDNISONE 20 MG PO TABS
40.0000 mg | ORAL_TABLET | Freq: Every day | ORAL | Status: DC
  Administered 2022-08-06 – 2022-08-07 (×2): 40 mg via ORAL
  Filled 2022-08-06 (×2): qty 2

## 2022-08-06 MED ORDER — IPRATROPIUM-ALBUTEROL 0.5-2.5 (3) MG/3ML IN SOLN
3.0000 mL | Freq: Four times a day (QID) | RESPIRATORY_TRACT | Status: DC
  Administered 2022-08-06 – 2022-08-07 (×4): 3 mL via RESPIRATORY_TRACT
  Filled 2022-08-06 (×4): qty 3

## 2022-08-06 NOTE — Progress Notes (Addendum)
     Daily Progress Note Intern Pager: [phone]  Patient name: Kylie Garner Medical record number: [id number] Date of birth: 14-Feb-1961 Age: 62 y.o. Gender: female  Primary Care Provider: Holley Bouche, MD Consultants: None Code Status: Full  Pt Overview and Major Events to Date:  10/10 - admitted  Assessment and Plan:  Kylie Garner is a 61 y.o. female who presented with cough and SOB and found to have multifocal pneumonia with COPD exacerbation. Pertinent PMH/PSH includes COPD, HTN, depression, anxiety, and prediabetes.  * Multifocal pneumonia Stable. O2 sats in low-mid 90s at rest. Continues to have expiratory wheezes and desaturations with ambulation. WBC 12.3>10.2. -Continue azithromycin, Rocephin as below -AM CBC/BMP -Continuous pulse ox  COPD exacerbation (HCC) Stable. O2 sats and exam improving as above. -Supplemental oxygen, wean as tolerated, goal oxygen saturation 88-92% -Duonebs every 6 hours -DuoNebs every 2 hours as needed for SOB, wheezing -Cardiac monitoring -Continue Incruse Ellipta 1 puff daily, azithromycin (day 2/3, switched to PO), Rocephin (day 2/5), oral prednisone 40mg  (day 1/4, s/p 1x methylprednisolone on admission) -Ambulate with pulse ox prior to discharge -PT/OT to see, appreciate recommendations   FEN/GI: Regular PPx: Lovenox Dispo:Home likely in 1-2 days pending clinical improvement.  Subjective:  Doing overall well this morning. O2 sat dropped when walking in the hall today, and she felt SOB at that time.  Objective: Temp:  [97.7 F (36.5 C)-99 F (37.2 C)] 97.7 F (36.5 C) (10/11 0828) Pulse Rate:  [49-88] 65 (10/11 1100) Resp:  [16-22] 19 (10/11 1100) BP: (103-129)/(47-82) 115/56 (10/11 1100) SpO2:  [81 %-98 %] 92 % (10/11 1100) Weight:  [113.9 kg-114.1 kg] 113.9 kg (10/10 1523) Physical Exam: General: Alert and oriented, in NAD Skin: Warm, dry HEENT: NCAT, EOM grossly normal, midline nasal septum Cardiac: RRR, no m/r/g  appreciated Respiratory: Expiratory wheezes throughout, breathing and speaking comfortably on RA Extremities: Moves all extremities grossly equally in bed Neurological: No gross focal deficit Psychiatric: Appropriate mood and affect   Laboratory: Most recent CBC Lab Results  Component Value Date   WBC 10.2 08/06/2022   HGB 13.1 08/06/2022   HCT 38.9 08/06/2022   MCV 83.5 08/06/2022   PLT 308 08/06/2022   Most recent BMP    Latest Ref Rng & Units 08/06/2022    4:19 AM  BMP  Glucose 70 - 99 mg/dL 149   BUN 6 - 20 mg/dL 10   Creatinine 0.44 - 1.00 mg/dL 0.67   Sodium 135 - 145 mmol/L 137   Potassium 3.5 - 5.1 mmol/L 4.3   Chloride 98 - 111 mmol/L 103   CO2 22 - 32 mmol/L 25   Calcium 8.9 - 10.3 mg/dL 9.0    Imaging/Diagnostic Tests:  CXR 10/10 IMPRESSION: There are multiple new patchy alveolar infiltrates in right lung suggesting possible multifocal pneumonia.  Ethelene Hal, MD 08/06/2022, 11:46 AM PGY-1, [place] Intern pager: [phone], text pages welcome Secure chat group [employer]

## 2022-08-06 NOTE — Telephone Encounter (Signed)
Pharmacy Patient Advocate Encounter  Insurance verification completed.    The patient is insured through [facility]   The patient is currently admitted and ran test claims for the following: Incruse Ellipta .  Copays and coinsurance results were relayed to Inpatient clinical team.

## 2022-08-06 NOTE — TOC Benefit Eligibility Note (Signed)
Patient Teacher, English as a foreign language completed.    The patient is currently admitted and upon discharge could be taking Incruse Ellipta inhaler.  The current 30 day co-pay is $4.00.   The patient is insured through [place], [place] Patient Advocate Specialist [place] Patient Advocate Team Direct Number: [phone]  Fax: [phone]

## 2022-08-06 NOTE — Hospital Course (Addendum)
Kylie Garner is a 61 y.o.female with a history of COPD who was admitted to the [facility] Medicine Teaching Service at [facility] for pneumonia and COPD exacerbation. Her hospital course is detailed below:  Multifocal PNA P/w productive cough and SOB x1 week. Seen in clinic on day of admission and found to have new O2 requirement while ambulating (desat to low 80s). CXR consistent w/ multifocal PNA; leukocytosis to 12.3 on admission. Started on IV azithromycin and Rocephin. Symptoms and leukocytosis improved by day 2 of admission. Patient did not have any oxygen desaturations while ambulating prior to discharge. Patient was discharged on PO cefdinir for 3 more days for 5-day total course.  COPD Exacerbation Previously controlled with home albuterol. Diffuse wheezing and prolonged expiratory phase on admission with productive cough. Received azithromycin and rocephin as above, IV solumedrol x1 and oral prednisone x2 days. Started on Incruse Ellipta 1 puff daily at discharge. Evaluated by PT/OT without need for further intervention. Upon discharge, patient was satting well on RA and able to ambulate without significant SOB. She was given 2 more days of prednisone to finish out 5-day course or steroids.  Chronic and stable conditions Anxiety/depression: continued on home Buspar, Zoloft  PCP Follow-up Recommendations: Follow-up respiratory status Ensure patient using Incruse Ellipta

## 2022-08-06 NOTE — ED Notes (Signed)
ED TO INPATIENT HANDOFF REPORT  ED Nurse Name and Phone #: Allexa Acoff RN [phone]  S Name/Age/Gender Kylie Garner 61 y.o. female Room/Bed: 040C/040C  Code Status   Code Status: Full Code  Home/SNF/Other Home Patient oriented to: self, place, time, and situation Is this baseline? Yes   Triage Complete: Triage complete  Chief Complaint Multifocal pneumonia [J18.9]  Triage Note Sent by PCP for new oxygen requirement.  Sats in triage 87-89% RA placed on 2L [place].  Patient reports sob and cough with white phlegm x 1 week Hx COPD   Allergies No Known Allergies  Level of Care/Admitting Diagnosis ED Disposition     ED Disposition  Admit   Condition  --   [place]: Kylie Garner [[id number]]  Level of Care: Med-Surg [16]  May admit patient to Kylie Garner or Kylie Garner if equivalent level of care is available:: No  Covid Evaluation: Confirmed COVID Negative  Diagnosis: Multifocal pneumonia [0240973]  Admitting Physician: Zenia Resides [place]  Attending Physician: Zenia Resides [[id number]]  Certification:: I certify this patient will need inpatient services for at least 2 midnights  Estimated Length of Stay: 2          B Medical/Surgery History Past Medical History:  Diagnosis Date   Anxiety    Bilateral leg edema 09/07/2017   COPD (chronic obstructive pulmonary disease) ([place])    Depression    Encounter for screening mammogram for malignant neoplasm of breast 03/14/2020   Headache 04/19/2018   Hypertension    Past Surgical History:  Procedure Laterality Date   APPENDECTOMY     CESAREAN SECTION  10/28/1991   KNEE CARTILAGE SURGERY Left      Kylie Garner Patient Lines/Drains/Airways Status     Active Line/Drains/Airways     Name Placement date Placement time Site Days   Peripheral IV 08/06/22 22 G Left Hand 08/06/22  0539  Hand  less than 1            Intake/Output Last 24 hours  Intake/Output Summary (Last  24 hours) at 08/06/2022 1717 Last data filed at 08/05/2022 2221 Gross per 24 hour  Intake 250 ml  Output --  Net 250 ml    Labs/Imaging Results for orders placed or performed during the hospital encounter of 08/05/22 (from the past 48 hour(s))  Resp Panel by RT-PCR (Flu Kylie&B, Covid) Anterior Nasal Swab     Status: None   Collection Time: 08/05/22  3:28 PM   Specimen: Anterior Nasal Swab  Result Value Ref Range   SARS Coronavirus 2 by RT PCR NEGATIVE NEGATIVE    Comment: (NOTE) SARS-CoV-2 target nucleic acids are NOT DETECTED.  The SARS-CoV-2 RNA is generally detectable in upper respiratory specimens during the acute phase of infection. The lowest concentration of SARS-CoV-2 viral copies this assay can detect is 138 copies/mL. Kylie negative result does not preclude SARS-Cov-2 infection and should not be used as the sole basis for treatment or other patient management decisions. Kylie negative result may occur with  improper specimen collection/handling, submission of specimen other than nasopharyngeal swab, presence of viral mutation(s) within the areas targeted by this assay, and inadequate number of viral copies(<138 copies/mL). Kylie negative result must be combined with clinical observations, patient history, and epidemiological information. The expected result is Negative.  Fact Sheet for Patients:  EntrepreneurPulse.com.au  Fact Sheet for Healthcare Providers:  IncredibleEmployment.be  This test is no t yet approved or cleared by the [place]  FDA and  has been authorized for detection and/or diagnosis of SARS-CoV-2 by FDA under an Emergency Use Authorization (EUA). This EUA will remain  in effect (meaning this test can be used) for the duration of the COVID-19 declaration under Section 564(b)(1) of the Act, 21 U.S.C.section 360bbb-3(b)(1), unless the authorization is terminated  or revoked sooner.       Influenza Kylie by PCR NEGATIVE  NEGATIVE   Influenza B by PCR NEGATIVE NEGATIVE    Comment: (NOTE) The Xpert Xpress SARS-CoV-2/FLU/RSV plus assay is intended as an aid in the diagnosis of influenza from Nasopharyngeal swab specimens and should not be used as Kylie sole basis for treatment. Nasal washings and aspirates are unacceptable for Xpert Xpress SARS-CoV-2/FLU/RSV testing.  Fact Sheet for Patients: BloggerCourse.com  Fact Sheet for Healthcare Providers: SeriousBroker.it  This test is not yet approved or cleared by the [place] FDA and has been authorized for detection and/or diagnosis of SARS-CoV-2 by FDA under an Emergency Use Authorization (EUA). This EUA will remain in effect (meaning this test can be used) for the duration of the COVID-19 declaration under Section 564(b)(1) of the Act, 21 U.S.C. section 360bbb-3(b)(1), unless the authorization is terminated or revoked.  Performed at [facility], [address], [place], [place] [address]   CBC with Differential     Status: Abnormal   Collection Time: 08/05/22  3:43 PM  Result Value Ref Range   WBC 12.3 (H) 4.0 - 10.5 K/uL   RBC 4.74 3.87 - 5.11 MIL/uL   Hemoglobin 13.3 12.0 - 15.0 g/dL   HCT 66.2 94.7 - 65.4 %   MCV 84.8 80.0 - 100.0 fL   MCH 28.1 26.0 - 34.0 pg   MCHC 33.1 30.0 - 36.0 g/dL   RDW 65.0 35.4 - 65.6 %   Platelets 319 150 - 400 K/uL   nRBC 0.0 0.0 - 0.2 %   Neutrophils Relative % 75 %   Neutro Abs 9.3 (H) 1.7 - 7.7 K/uL   Lymphocytes Relative 16 %   Lymphs Abs 1.9 0.7 - 4.0 K/uL   Monocytes Relative 6 %   Monocytes Absolute 0.7 0.1 - 1.0 K/uL   Eosinophils Relative 2 %   Eosinophils Absolute 0.2 0.0 - 0.5 K/uL   Basophils Relative 0 %   Basophils Absolute 0.1 0.0 - 0.1 K/uL   Immature Granulocytes 1 %   Abs Immature Granulocytes 0.09 (H) 0.00 - 0.07 K/uL    Comment: Performed at [facility], [address], [place], [place] [address]  Basic metabolic panel      Status: Abnormal   Collection Time: 08/05/22  3:43 PM  Result Value Ref Range   Sodium 138 135 - 145 mmol/L   Potassium 3.9 3.5 - 5.1 mmol/L   Chloride 101 98 - 111 mmol/L   CO2 29 22 - 32 mmol/L   Glucose, Bld 109 (H) 70 - 99 mg/dL    Comment: Glucose reference range applies only to samples taken after fasting for at least 8 hours.   BUN 10 6 - 20 mg/dL   Creatinine, Ser 1.70 0.44 - 1.00 mg/dL   Calcium 9.1 8.9 - 01.7 mg/dL   GFR, Estimated >49 >44 mL/min    Comment: (NOTE) Calculated using the CKD-EPI Creatinine Equation (2021)    Anion gap 8 5 - 15    Comment: Performed at [facility], [address], [place], [place] [address]  Basic metabolic panel  Status: Abnormal   Collection Time: 08/06/22  4:19 AM  Result Value Ref Range   Sodium 137 135 - 145 mmol/L   Potassium 4.3 3.5 - 5.1 mmol/L   Chloride 103 98 - 111 mmol/L   CO2 25 22 - 32 mmol/L   Glucose, Bld 149 (H) 70 - 99 mg/dL    Comment: Glucose reference range applies only to samples taken after fasting for at least 8 hours.   BUN 10 6 - 20 mg/dL   Creatinine, Ser 2.59 0.44 - 1.00 mg/dL   Calcium 9.0 8.9 - 56.3 mg/dL   GFR, Estimated >87 >56 mL/min    Comment: (NOTE) Calculated using the CKD-EPI Creatinine Equation (2021)    Anion gap 9 5 - 15    Comment: Performed at [facility], [address], [place], [place] [address]  CBC     Status: None   Collection Time: 08/06/22  4:19 AM  Result Value Ref Range   WBC 10.2 4.0 - 10.5 K/uL   RBC 4.66 3.87 - 5.11 MIL/uL   Hemoglobin 13.1 12.0 - 15.0 g/dL   HCT 51.8 84.1 - 66.0 %   MCV 83.5 80.0 - 100.0 fL   MCH 28.1 26.0 - 34.0 pg   MCHC 33.7 30.0 - 36.0 g/dL   RDW 63.0 16.0 - 10.9 %   Platelets 308 150 - 400 K/uL   nRBC 0.0 0.0 - 0.2 %    Comment: Performed at [facility], [address] [place], [place] [address]   DG Chest 1 View  Result Date: 08/05/2022 CLINICAL DATA:  Shortness of breath and cough EXAM: CHEST  1 VIEW COMPARISON:   05/15/2022 FINDINGS: Cardiac size is within normal limits. New patchy infiltrates are seen in right lung. Left lung is essentially clear. There is no pleural effusion or pneumothorax. IMPRESSION: There are multiple new patchy alveolar infiltrates in right lung suggesting possible multifocal pneumonia. Electronically Signed   By: Ernie Avena M.D.   On: 08/05/2022 16:06    Pending Labs Unresulted Labs (From admission, onward)     Start     Ordered   08/07/22 0500  Basic metabolic panel  Tomorrow morning,   R        08/06/22 1713   08/05/22 2111  HIV Antibody (routine testing w rflx)  (HIV Antibody (Routine testing w reflex) panel)  Once,   R        08/05/22 2113            Vitals/Pain Today's Vitals   08/06/22 1300 08/06/22 1500 08/06/22 1600 08/06/22 1614  BP: (!) 107/45 127/60 132/74   Pulse: 65 65 (!) 58   Resp: 15 19 18    Temp:    97.7 F (36.5 C)  TempSrc:    Oral  SpO2: 92% 95% 95%   Weight:      Height:      PainSc:        Isolation Precautions No active isolations  Medications Medications  cefTRIAXone (ROCEPHIN) 1 g in sodium chloride 0.9 % 100 mL IVPB (has no administration in time range)  busPIRone (BUSPAR) tablet 5 mg (5 mg Oral Given 08/06/22 1015)  sertraline (ZOLOFT) tablet 100 mg (100 mg Oral Given 08/06/22 1015)  ipratropium-albuterol (DUONEB) 0.5-2.5 (3) MG/3ML nebulizer solution 3 mL (has no administration in time range)  umeclidinium bromide (INCRUSE ELLIPTA) 62.5 MCG/ACT 1 puff (1 puff Inhalation Not Given 08/06/22 1016)  enoxaparin (LOVENOX) injection 40 mg (0 mg  Subcutaneous Hold 08/06/22 0004)  acetaminophen (TYLENOL) tablet 650 mg (has no administration in time range)    Or  acetaminophen (TYLENOL) suppository 650 mg (has no administration in time range)  predniSONE (DELTASONE) tablet 40 mg (40 mg Oral Given 08/06/22 1015)  ipratropium-albuterol (DUONEB) 0.5-2.5 (3) MG/3ML nebulizer solution 3 mL (3 mLs Nebulization Patient Refused/Not  Given 08/06/22 1352)  azithromycin (ZITHROMAX) tablet 500 mg (500 mg Oral Given 08/06/22 1153)  methylPREDNISolone sodium succinate (SOLU-MEDROL) 125 mg/2 mL injection 125 mg (125 mg Intravenous Given 08/05/22 2024)  cefTRIAXone (ROCEPHIN) 1 g in sodium chloride 0.9 % 100 mL IVPB (0 g Intravenous Stopped 08/05/22 2121)  azithromycin (ZITHROMAX) 500 mg in sodium chloride 0.9 % 250 mL IVPB (0 mg Intravenous Stopped 08/05/22 2221)    Mobility walks Low fall risk   Focused Assessments Cardiac Assessment Handoff:    No results found for: "CKTOTAL", "CKMB", "CKMBINDEX", "TROPONINI" No results found for: "DDIMER" Does the Patient currently have chest pain? No   , Pulmonary Assessment Handoff:  Lung sounds: L Breath Sounds: Diminished R Breath Sounds: Diminished O2 Device: Room Air O2 Flow Rate (L/min): 2 L/min    R Recommendations: See Admitting Provider Note  Report given to:   Additional Notes:

## 2022-08-06 NOTE — Evaluation (Signed)
Occupational Therapy Evaluation/Discharge Patient Details Name: Kylie Garner MRN: [id number] DOB: Oct 14, 1961 Today's Date: 08/06/2022   History of Present Illness Pt is a 61 y/o female admitted with multifocal PNA and COPD exacerbation. PMH: COPD, HTN, depression, anxiety, prediabetes   Clinical Impression   PTA, pt lives with family, typically Modified Independent with ADLs, IADLs and mobility with intermittent use of Rollator in the community. Pt presents now with deficits in cardiopulmonary tolerance though able to complete ADLs/mobility Independently. With long distance mobility, pt desats to 86% on RA though recovers within 1 minute seated rest break to 92% without need for supplemental O2. Educated re: energy conservation strategies, gradual return of activity tolerance and obtaining pulse ox to monitor O2 sats at home. Pt verbalized understanding of education with no further OT needs at this time. OT to sign off.       Recommendations for follow up therapy are one component of a multi-disciplinary discharge planning process, led by the attending physician.  Recommendations may be updated based on patient status, additional functional criteria and insurance authorization.   Follow Up Recommendations  No OT follow up    Assistance Recommended at Discharge None  Patient can return home with the following      Functional Status Assessment  Patient has not had a recent decline in their functional status  Equipment Recommendations  None recommended by OT    Recommendations for Other Services       Precautions / Restrictions Precautions Precautions: Other (comment) Precaution Comments: monitor O2 Restrictions Weight Bearing Restrictions: No      Mobility Bed Mobility Overal bed mobility: Modified Independent                  Transfers Overall transfer level: Independent Equipment used: None                      Balance Overall balance assessment: No  apparent balance deficits (not formally assessed)                                         ADL either performed or assessed with clinical judgement   ADL Overall ADL's : Independent                                       General ADL Comments: able to don gown around back in standing, mobilize in room and hallway without AD and without safety concerns. Educated on energy conservation, obtaining pulse ox to monitor O2, good pleth on monitor (as pt reports watching this as well overnight), and prioritizing/pacing daily tasks     Vision Baseline Vision/History: 1 Wears glasses Ability to See in Adequate Light: 0 Adequate Patient Visual Report: No change from baseline Vision Assessment?: No apparent visual deficits     Perception     Praxis      Pertinent Vitals/Pain Pain Assessment Pain Assessment: No/denies pain     Hand Dominance Right   Extremity/Trunk Assessment Upper Extremity Assessment Upper Extremity Assessment: Overall WFL for tasks assessed   Lower Extremity Assessment Lower Extremity Assessment: Overall WFL for tasks assessed   Cervical / Trunk Assessment Cervical / Trunk Assessment: Normal   Communication Communication Communication: No difficulties   Cognition Arousal/Alertness: Awake/alert Behavior During Therapy: WFL for tasks assessed/performed Overall Cognitive  Status: Within Functional Limits for tasks assessed                                       General Comments       Exercises     Shoulder Instructions      Home Living Family/patient expects to be discharged to:: Private residence Living Arrangements: Other relatives (2 nephews) Available Help at Discharge: Family Type of Home: House Home Access: Level entry     Home Layout: One level     Bathroom Shower/Tub: Teacher, early years/pre: Standard     Home Equipment: Air cabin crew (4 wheels)          Prior  Functioning/Environment Prior Level of Function : Independent/Modified Independent;Driving             Mobility Comments: no AD for short distance mobility, uses Rollator out in community/stores ADLs Comments: Independent with ADLs, drives but will take bus out in community too. Shares IADls in the home        OT Problem List: Cardiopulmonary status limiting activity      OT Treatment/Interventions:      OT Goals(Current goals can be found in the care plan section) Acute Rehab OT Goals Patient Stated Goal: improve breathing OT Goal Formulation: All assessment and education complete, DC therapy  OT Frequency:      Co-evaluation              AM-PAC OT "6 Clicks" Daily Activity     Outcome Measure Help from another person eating meals?: None Help from another person taking care of personal grooming?: None Help from another person toileting, which includes using toliet, bedpan, or urinal?: None Help from another person bathing (including washing, rinsing, drying)?: None Help from another person to put on and taking off regular upper body clothing?: None Help from another person to put on and taking off regular lower body clothing?: None 6 Click Score: 24   End of Session    Activity Tolerance: Patient tolerated treatment well Patient left: in bed;with call bell/phone within reach  OT Visit Diagnosis: Other (comment) (decreased cardiopulmonary tolerance)                Time: [id number] OT Time Calculation (min): 14 min Charges:  OT General Charges $OT Visit: 1 Visit OT Evaluation $OT Eval Low Complexity: 1 Low  Malachy Chamber, OTR/L Acute Rehab Services Office: [phone]   Layla Maw 08/06/2022, 7:51 AM

## 2022-08-07 ENCOUNTER — Other Ambulatory Visit (HOSPITAL_COMMUNITY): Payer: Self-pay

## 2022-08-07 LAB — HIV ANTIBODY (ROUTINE TESTING W REFLEX): HIV Screen 4th Generation wRfx: NONREACTIVE

## 2022-08-07 LAB — BASIC METABOLIC PANEL
Anion gap: 13 (ref 5–15)
BUN: 19 mg/dL (ref 6–20)
CO2: 25 mmol/L (ref 22–32)
Calcium: 9.3 mg/dL (ref 8.9–10.3)
Chloride: 103 mmol/L (ref 98–111)
Creatinine, Ser: 0.93 mg/dL (ref 0.44–1.00)
GFR, Estimated: >60 mL/min (ref >60)
Glucose, Bld: 110 mg/dL — ABNORMAL HIGH (ref 70–99)
Potassium: 4 mmol/L (ref 3.5–5.1)
Sodium: 141 mmol/L (ref 135–145)

## 2022-08-07 MED ORDER — UMECLIDINIUM BROMIDE 62.5 MCG/ACT IN AEPB
1.0000 | INHALATION_SPRAY | Freq: Every day | RESPIRATORY_TRACT | 0 refills | Status: DC
  Filled 2022-08-07: qty 30, 30d supply, fill #0

## 2022-08-07 MED ORDER — CEFDINIR 300 MG PO CAPS
300.0000 mg | ORAL_CAPSULE | Freq: Two times a day (BID) | ORAL | 0 refills | Status: AC
  Filled 2022-08-07: qty 6, 3d supply, fill #0

## 2022-08-07 MED ORDER — PREDNISONE 20 MG PO TABS
40.0000 mg | ORAL_TABLET | Freq: Every day | ORAL | 0 refills | Status: AC
  Filled 2022-08-07: qty 4, 2d supply, fill #0

## 2022-08-07 NOTE — Plan of Care (Signed)

## 2022-08-07 NOTE — TOC Transition Note (Addendum)
Transition of Care [facility]) - CM/SW Discharge Note   Patient Details  Name: Kylie Garner MRN: [id number] Date of Birth: July 21, 1961  [facility]) CM/SW Contact:  Pollie Friar, RN Phone Number: 08/07/2022, 11:32 AM   Clinical Narrative:    Pt is discharging home with self care. No f/u per PT/OT. Pt did not qualify for home oxygen wit her ambulatory sats test.  Pt lives with nephew and his girlfriend. Nephew to provide transport home.  She denies any issues with home medications.  Transportation information added to the AVS.   SDOH Interventions Today    Flowsheet Row Most Recent Value  SDOH Interventions   Transportation Interventions Inpatient TOC         Final next level of care: Home/Self Care Barriers to Discharge: No Barriers Identified   Patient Goals and CMS Choice        Discharge Placement                       Discharge Plan and Services                                     Social Determinants of Health (SDOH) Interventions     Readmission Risk Interventions     No data to display

## 2022-08-07 NOTE — Discharge Summary (Addendum)
[facility] Discharge Summary  Patient name: Kylie Garner record number: [id number] Date of birth: [birth year] Age: 61 y.o. Gender: female Date of Admission: 08/05/2022  Date of Discharge: 08/07/2022 Admitting Physician: Zenia Resides, MD  Primary Care Provider: Holley Bouche, MD Consultants: None  Indication for Hospitalization: multifocal pneumonia with COPD exacerbation  Brief Hospital Course:  Kylie Garner is a 61 y.o.female with a history of COPD who was admitted to the [facility] Medicine Teaching Service at [facility] for pneumonia and COPD exacerbation. Her hospital course is detailed below:  Multifocal PNA P/w productive cough and SOB x1 week. Seen in clinic on day of admission and found to have new O2 requirement while ambulating (desat to low 80s). CXR consistent w/ multifocal PNA; leukocytosis to 12.3 on admission. Started on IV azithromycin and Rocephin. Symptoms and leukocytosis improved by day 2 of admission. Patient did not have any oxygen desaturations while ambulating prior to discharge. Patient was discharged on PO cefdinir for 3 more days for 5-day total course.  COPD Exacerbation Previously controlled with home albuterol. Diffuse wheezing and prolonged expiratory phase on admission with productive cough. Received azithromycin and rocephin as above, IV solumedrol x1 and oral prednisone x2 days. Started on Incruse Ellipta 1 puff daily at discharge. Evaluated by PT/OT without need for further intervention. Upon discharge, patient was satting well on RA and able to ambulate without significant SOB. She was given 2 more days of prednisone to finish out 5-day course or steroids.  Chronic and stable conditions Anxiety/depression: continued on home Buspar, Zoloft  PCP Follow-up Recommendations: Follow-up respiratory status Ensure patient using Incruse Ellipta  Discharge Diagnoses/Problem List:  Principal Problem for Admission: Present  on Admission:  Multifocal pneumonia COPD exacerbation   Disposition: Home  Discharge Condition: Stable  Discharge Exam:  Blood pressure (!) 104/54, pulse (!) 54, temperature 97.9 F (36.6 C), temperature source Axillary, resp. rate 15, height 5\' 3"  (1.6 m), weight 113.9 kg, SpO2 93 %.  General: Alert and oriented, in NAD Skin: Warm, dry, and intact HEENT: NCAT, EOM grossly normal, midline nasal septum Cardiac: RRR, no m/r/g appreciated Respiratory: Expiratory wheezes appreciated throughout, breathing and speaking comfortably on RA Extremities: Moves all extremities grossly equally in bed Neurological: No gross focal deficit Psychiatric: Appropriate mood and affect   Significant Procedures: none  Significant Labs and Imaging:  Recent Labs  Lab 08/05/22 1543 08/06/22 0419  WBC 12.3* 10.2  HGB 13.3 13.1  HCT 40.2 38.9  PLT 319 308   Recent Labs  Lab 08/05/22 1543 08/06/22 0419 08/07/22 0347  NA 138 137 141  K 3.9 4.3 4.0  CL 101 103 103  CO2 29 25 25   GLUCOSE 109* 149* 110*  BUN 10 10 19   CREATININE 0.76 0.67 0.93  CALCIUM 9.1 9.0 9.3   CXR IMPRESSION: There are multiple new patchy alveolar infiltrates in right lung suggesting possible multifocal pneumonia.  Results/Tests Pending at Time of Discharge: none  Discharge Medications:  Allergies as of 08/07/2022   No Known Allergies      Medication List     STOP taking these medications    azithromycin 250 MG tablet Commonly known as: ZITHROMAX       TAKE these medications    Advil 200 MG Caps Generic drug: Ibuprofen Take 400 mg by mouth every 6 (six) hours as needed (for fever).   AeroChamber Plus Flo-Vu Large Misc 1 each by Other route once.   albuterol 108 (90 Base) MCG/ACT  inhaler Commonly known as: ProAir HFA Inhale 1-2 puffs into the lungs every 6 (six) hours as needed for wheezing or shortness of breath.   busPIRone 5 MG tablet Commonly known as: BUSPAR Take 1 tablet (5 mg total) by  mouth 3 (three) times daily.   cefdinir 300 MG capsule Commonly known as: OMNICEF Take 1 capsule (300 mg total) by mouth 2 (two) times daily for 3 days.   Incruse Ellipta 62.5 MCG/ACT Aepb Generic drug: umeclidinium bromide Inhale 1 puff into the lungs daily. Start taking on: August 08, 2022   predniSONE 20 MG tablet Commonly known as: DELTASONE Take 2 tablets (40 mg total) by mouth daily with breakfast for 2 days. Start taking on: August 08, 2022   sertraline 100 MG tablet Commonly known as: ZOLOFT Take 1 tablet (100 mg total) by mouth daily.       Discharge Instructions: Please refer to Patient Instructions section of EMR for full details.  Patient was counseled important signs and symptoms that should prompt return to medical care, changes in medications, dietary instructions, activity restrictions, and follow up appointments.   Follow-Up Appointments:  Follow-up Information     Jerre Simon, MD. Go on 08/15/2022.   Specialty: Family Medicine Why: 9:50 AM (arrive by 9:35) Contact information: [address] [place] [address] [phone]         Transportation services through your insurance Follow up.   Why: They usually require a 2-3 day notice. Contact information: [phone]               Janeal Holmes, MD 08/07/2022, 1:17 PM PGY-1, [facility] Family Medicine   I was personally present and performed or re-performed the history, physical exam and medical decision making activities of this service and have verified that the service and findings are accurately documented in the resident's note.  Littie Deeds, MD                  08/07/2022, 2:14 PM

## 2022-08-07 NOTE — Progress Notes (Signed)
Patient discharging home. Vital signs stable at time of discharge as reflected in discharge summary. Discharge instructions given and verbal understanding returned. No questions at this time. TOC medications have been delivered.

## 2022-08-07 NOTE — Progress Notes (Signed)
Nurse requested Mobility Specialist to perform oxygen saturation test with pt which includes removing pt from oxygen both at rest and while ambulating.  Below are the results from that testing.     Patient Saturations on Room Air at Rest = spO2 93%%  Patient Saturations on Room Air while Ambulating = sp02 89% .  Rested and performed pursed lip breathing for 1 minute with sp02 at 93%.  Patient Saturations on -- Liters of oxygen while Ambulating = N/A  At end of testing pt left in room on --  Liters of oxygen = 91% SpO2  Reported results to nurse.

## 2022-08-07 NOTE — Discharge Instructions (Signed)
Dear Kylie Garner,   Thank you so much for allowing [place] to be part of your care!  You were admitted to [facility] for a pneumonia and COPD exacerbation. We treated you with antibiotics and inhalers.   POST-HOSPITAL & CARE INSTRUCTIONS Continue taking your inhaler at home Please let PCP/Specialists know of any changes that were made.  Please see medications section of this packet for any medication changes.   DOCTOR'S APPOINTMENT & FOLLOW UP CARE INSTRUCTIONS  No future appointments.  RETURN PRECAUTIONS: Worsening shortness of breath, chest pain, fevers  Take care and be well!  [facility]  [place], [place] [address] [phone]

## 2022-08-07 NOTE — Progress Notes (Signed)
     Daily Progress Note Intern Pager: [phone]  Patient name: Kylie Garner Medical record number: [id number] Date of birth: 01-18-1961 Age: 61 y.o. Gender: female  Primary Care Provider: Holley Bouche, MD Consultants: None Code Status: Full   Pt Overview and Major Events to Date:  10/10 - admitted   Assessment and Plan:   Kylie Garner is a 61 y.o. female who presented with cough and SOB and found to have multifocal pneumonia with COPD exacerbation. Pertinent PMH/PSH includes COPD, HTN, depression, anxiety, and prediabetes.  * Multifocal pneumonia Stable. O2 sats stable, currently on RA, no desats with ambulation. Continues to have expiratory wheezes. Can d/c today. -Continue azithromycin -Switch rocephin to cefdinir 300 mg BID for 3 more days outpatient -Continuous pulse ox  COPD exacerbation (HCC) Stable. O2 sats and exam improving as above. Can likely d/c today. -Supplemental oxygen, wean as tolerated, goal oxygen saturation 88-92% -Duonebs every 6 hours -DuoNebs every 2 hours as needed for SOB, wheezing -D/c cardiac monitoring -Continue Incruse Ellipta 1 puff daily, azithromycin (day 3/3, switched to PO), cefdinir (day 1/8), oral prednisone 40mg  (day 2/4, s/p 1x methylprednisolone on admission) -Ambulate with pulse ox prior to discharge -OT with no follow up   FEN/GI: Regular PPx: Lovenox Dispo:Home likely today  Subjective:  Doing well this morning with no complaints. She is breathing well at rest and when ambulating.  Objective: Temp:  [97.5 F (36.4 C)-98.8 F (37.1 C)] 98.2 F (36.8 C) (10/12 0748) Pulse Rate:  [54-97] 54 (10/12 0748) Resp:  [15-21] 18 (10/12 0748) BP: (105-132)/(45-74) 110/61 (10/12 0748) SpO2:  [89 %-96 %] 96 % (10/12 0912) Physical Exam: General: Alert and oriented, in NAD Skin: Warm, dry, and intact HEENT: NCAT, EOM grossly normal, midline nasal septum Cardiac: RRR, no m/r/g appreciated Respiratory: Expiratory wheezes  appreciated throughout, breathing and speaking comfortably on RA Extremities: Moves all extremities grossly equally in bed Neurological: No gross focal deficit Psychiatric: Appropriate mood and affect   Laboratory: Most recent CBC Lab Results  Component Value Date   WBC 10.2 08/06/2022   HGB 13.1 08/06/2022   HCT 38.9 08/06/2022   MCV 83.5 08/06/2022   PLT 308 08/06/2022   Most recent BMP    Latest Ref Rng & Units 08/07/2022    3:47 AM  BMP  Glucose 70 - 99 mg/dL 110   BUN 6 - 20 mg/dL 19   Creatinine 0.44 - 1.00 mg/dL 0.93   Sodium 135 - 145 mmol/L 141   Potassium 3.5 - 5.1 mmol/L 4.0   Chloride 98 - 111 mmol/L 103   CO2 22 - 32 mmol/L 25   Calcium 8.9 - 10.3 mg/dL 9.3    Ethelene Hal, MD 08/07/2022, 9:26 AM PGY-1, [place] Intern pager: [phone], text pages welcome Secure chat group [place]

## 2022-08-07 NOTE — Progress Notes (Signed)
Mobility Specialist: Progress Note   08/07/22 1027  Mobility  Activity Ambulated with assistance in hallway  Level of Assistance Modified independent, requires aide device or extra time  Assistive Device Four wheel walker  Distance Ambulated (ft) 350 ft  Activity Response Tolerated well  $Mobility charge 1 Mobility   Pre-Mobility: 93% SpO2 During Mobility: 80 HR, 89% > 93% SpO2 Post-Mobility: 60 HR, 91% SpO2  Pt received in the bed and agreeable to mobility. Stopped x1 for standing break with c/o mild SOB during ambulation, sats as seen above. Pt to BR after session and instructed to pull call string once finished to be assisted back to bed.   [place] Kylie Garner Mobility Specialist Secure Chat Only

## 2022-08-08 ENCOUNTER — Telehealth: Payer: Self-pay

## 2022-08-08 NOTE — Patient Outreach (Signed)
Care Coordination  08/08/2022  Kylie Garner 1960/11/03 [id number]   Transition Care Management Follow-up Telephone Call Date of discharge and from where: 08/07/22 [facility] How have you been since you were released from the hospital? [facility], I am taking my time, I still get out of breath Any questions or concerns? Yes  Items Reviewed: Did the pt receive and understand the discharge instructions provided? Yes  Medications obtained and verified? No  Other? No  Any new allergies since your discharge? No  Dietary orders reviewed? No Do you have support at home? Yes   Home Care and Equipment/Supplies: Were home health services ordered? not applicable If so, what is the name of the agency?  Has the agency set up a time to come to the patient's home? not applicable Were any new equipment or medical supplies ordered?  No What is the name of the medical supply agency?  Were you able to get the supplies/equipment? not applicable Do you have any questions related to the use of the equipment or supplies? No  Functional Questionnaire: (I = Independent and D = Dependent) ADLs: I  Bathing/Dressing- I  Meal Prep- I  Eating- I  Maintaining continence- I  Transferring/Ambulation- I  Managing Meds- I  Follow up appointments reviewed:  PCP Hospital f/u appt confirmed? Yes  Scheduled to see Alen Bleacher on 08/15/22 @ 9:50. [facility] f/u appt confirmed? No  Scheduled to see  on  Are transportation arrangements needed? No  If their condition worsens, is the pt aware to call PCP or go to the Emergency Dept.? Yes Was the patient provided with contact information for the PCP's office or ED? Yes Was to pt encouraged to call back with questions or concerns? Yes  Mickel Fuchs, BSW, [place] Managed Medicaid Team  [phone]

## 2022-08-14 NOTE — Progress Notes (Cosign Needed Addendum)
    SUBJECTIVE:   CHIEF COMPLAINT / HPI:   Patient is a 61 year old female present with concerns of COPD exacerbation She has history of  COPD and OSA supposed to be on CPAP Her medication include Incruse Ellipta daily and PRN albuterol. Patient endorses complains Recently hospitalized for multifocal PNA and COPD exacerbation She reports improvement of symptom since discharge Denies any fever, chills or chest pain. Patient report she stopped smoking a month ago and overall doing much better.  PERTINENT  PMH / PSH: Reviewed  OBJECTIVE:   BP 118/65   Pulse 65   Ht 5\' 3"  (1.6 m)   Wt 257 lb 12.8 oz (116.9 kg)   SpO2 96%   BMI 45.67 kg/m    Physical Exam General: Alert, well appearing, NAD Cardiovascular: RRR, No Murmurs, Normal S2/S2 Respiratory: CTAB, No wheezing or Rales Abdomen: No distension or tenderness Extremities: No edema on extremities     ASSESSMENT/PLAN:   COPD exacerbation (HCC) Patient endorses improvement of symptoms since discharge from hospital.  She also reports compliance with her daily Incruse Ellipta.  Patient is doing much better and her exam was unremarkable.  Advised patient to continue her daily Incruse and encouraged her to continue working on weight loss as this would improve her dyspnea and overall health.  Patient verbalized understanding and agreeable to plan.  Smoking Patient with extensive smoking history and COPD report smoking cessation for a month. Encouraged patient to continue abstinence from tobacco use and informed her the pharmacy team in the clinic is a good resource that can help if she needs additional help. Explained benefit of smoking cessation in improving her dyspnea and avoiding COPD exacerbation.     Alen Bleacher, MD [place]

## 2022-08-15 ENCOUNTER — Encounter: Payer: Self-pay | Admitting: Student

## 2022-08-15 ENCOUNTER — Ambulatory Visit (INDEPENDENT_AMBULATORY_CARE_PROVIDER_SITE_OTHER): Payer: Medicaid Other | Admitting: Student

## 2022-08-15 VITALS — BP 118/65 | HR 65 | Ht 63.0 in | Wt 257.8 lb

## 2022-08-15 DIAGNOSIS — J441 Chronic obstructive pulmonary disease with (acute) exacerbation: Secondary | ICD-10-CM

## 2022-08-15 DIAGNOSIS — Z23 Encounter for immunization: Secondary | ICD-10-CM | POA: Diagnosis not present

## 2022-08-15 NOTE — Patient Instructions (Signed)
It was wonderful to meet you today. Thank you for allowing me to be a part of your care. Below is a short summary of what we discussed at your visit today:  Glad to hear that you are doing much better and your shortness of breath has since improved.  Please continue to take your Incruse daily and you have your as needed albuterol if you have worsening shortness of breath.  Congratulations on quitting smoking.  Today we gave you the influenza Vaccine  Please bring all of your medications to every appointment!  If you have any questions or concerns, please do not hesitate to contact us via phone or MyChart message.   Alen Bleacher, MD [facility]

## 2022-08-15 NOTE — Assessment & Plan Note (Signed)
Patient endorses improvement of symptoms since discharge from hospital.  She also reports compliance with her daily Incruse Ellipta.  Patient is doing much better and her exam was unremarkable.  Advised patient to continue her daily Incruse and encouraged her to continue working on weight loss as this would improve her dyspnea and overall health.  Patient verbalized understanding and agreeable to plan.

## 2022-10-06 ENCOUNTER — Other Ambulatory Visit: Payer: Self-pay | Admitting: Student

## 2022-10-06 ENCOUNTER — Telehealth: Payer: Self-pay | Admitting: Student

## 2022-10-06 DIAGNOSIS — F329 Major depressive disorder, single episode, unspecified: Secondary | ICD-10-CM

## 2022-10-06 NOTE — Telephone Encounter (Signed)
Patient stopped by needs refill of meds first one is Buspar 5 mg Tab, Incruse Ellipta 62.5 mcg, Zoloft 100 mg and Materials engineer.  Please send to [facility] in [place].  Any questions please call patient.

## 2022-10-07 ENCOUNTER — Other Ambulatory Visit: Payer: Self-pay | Admitting: Student

## 2022-10-07 DIAGNOSIS — F329 Major depressive disorder, single episode, unspecified: Secondary | ICD-10-CM

## 2022-10-07 DIAGNOSIS — J449 Chronic obstructive pulmonary disease, unspecified: Secondary | ICD-10-CM

## 2022-10-07 MED ORDER — UMECLIDINIUM BROMIDE 62.5 MCG/ACT IN AEPB: 1.0000 | INHALATION_SPRAY | Freq: Every day | RESPIRATORY_TRACT | 0 refills | Status: DC

## 2022-10-07 MED ORDER — SERTRALINE HCL 100 MG PO TABS: 100.0000 mg | ORAL_TABLET | Freq: Every day | ORAL | 0 refills | Status: DC

## 2022-10-07 MED ORDER — BUSPIRONE HCL 5 MG PO TABS: 5.0000 mg | ORAL_TABLET | Freq: Three times a day (TID) | ORAL | 0 refills | Status: DC

## 2022-10-07 MED ORDER — AEROCHAMBER PLUS FLO-VU LARGE MISC: 1.0000 | Freq: Once | 0 refills | Status: AC

## 2022-12-15 ENCOUNTER — Other Ambulatory Visit: Payer: Self-pay | Admitting: Student

## 2022-12-15 DIAGNOSIS — F329 Major depressive disorder, single episode, unspecified: Secondary | ICD-10-CM

## 2022-12-26 ENCOUNTER — Ambulatory Visit: Payer: No Typology Code available for payment source | Admitting: Student

## 2022-12-26 NOTE — Progress Notes (Deleted)
  SUBJECTIVE:   CHIEF COMPLAINT / HPI:   COPD Meds: incruise ellipta  Prediabetes -Last A1c 03/14/22: 5.7  Depression: Meds: Zoloft 100 mg Managed by [employer]?  OSA Seen in [facility] clinc 06/04/22 rec sleep study  PERTINENT  PMH / PSH: ***  Past Medical History:  Diagnosis Date   Anxiety    Bilateral leg edema 09/07/2017   COPD (chronic obstructive pulmonary disease) ([place])    Depression    Encounter for screening mammogram for malignant neoplasm of breast 03/14/2020   Headache 04/19/2018   Hypertension     OBJECTIVE:  There were no vitals taken for this visit. Physical Exam   ASSESSMENT/PLAN:  There are no diagnoses linked to this encounter. No follow-ups on file. Holley Bouche, MD 12/26/2022, 8:15 AM PGY-***, [facility] {    This will disappear when note is signed, click to select method of visit    :1}

## 2022-12-26 NOTE — Patient Instructions (Incomplete)
It was great to see you! Thank you for allowing me to participate in your care!  I recommend that you always bring your medications to each appointment as this makes it easy to ensure we are on the correct medications and helps us not miss when refills are needed.  Our plans for today:  - *** -   We are checking some labs today, I will call you if they are abnormal will send you a MyChart message or a letter if they are normal.  If you do not hear about your labs in the next 2 weeks please let us know.***  Take care and seek immediate care sooner if you develop any concerns.   Dr. Kerrianne Jeng, MD [facility]  

## 2022-12-29 ENCOUNTER — Ambulatory Visit: Payer: Medicaid Other

## 2022-12-30 ENCOUNTER — Ambulatory Visit (INDEPENDENT_AMBULATORY_CARE_PROVIDER_SITE_OTHER): Payer: No Typology Code available for payment source | Admitting: Family Medicine

## 2022-12-30 ENCOUNTER — Encounter: Payer: Self-pay | Admitting: Family Medicine

## 2022-12-30 VITALS — BP 126/67 | HR 79 | Ht 63.0 in | Wt 265.2 lb

## 2022-12-30 DIAGNOSIS — F329 Major depressive disorder, single episode, unspecified: Secondary | ICD-10-CM | POA: Diagnosis not present

## 2022-12-30 DIAGNOSIS — H8110 Benign paroxysmal vertigo, unspecified ear: Secondary | ICD-10-CM

## 2022-12-30 DIAGNOSIS — J449 Chronic obstructive pulmonary disease, unspecified: Secondary | ICD-10-CM

## 2022-12-30 MED ORDER — ALBUTEROL SULFATE HFA 108 (90 BASE) MCG/ACT IN AERS: 1.0000 | INHALATION_SPRAY | Freq: Four times a day (QID) | RESPIRATORY_TRACT | 3 refills | Status: DC | PRN

## 2022-12-30 MED ORDER — UMECLIDINIUM BROMIDE 62.5 MCG/ACT IN AEPB: 1.0000 | INHALATION_SPRAY | Freq: Every day | RESPIRATORY_TRACT | 0 refills | Status: DC

## 2022-12-30 MED ORDER — BUSPIRONE HCL 5 MG PO TABS: 5.0000 mg | ORAL_TABLET | Freq: Three times a day (TID) | ORAL | 0 refills | Status: DC

## 2022-12-30 MED ORDER — SERTRALINE HCL 100 MG PO TABS: 100.0000 mg | ORAL_TABLET | Freq: Every day | ORAL | 1 refills | Status: DC

## 2022-12-30 NOTE — Progress Notes (Signed)
    SUBJECTIVE:   CHIEF COMPLAINT / HPI:   Intermittent dizziness and blurred vision - Had BPPV about 2 years ago - Is having significant dizziness when she lays down and turns her head to the side - sometimes has dizziness when going from sitting to standing  Depression - Patient has many stressors including family  - has support from her youngest daughter - Reports she would not go through any plan for hurting herself or others - Needing medication refills - Would like to start therapy   PERTINENT  PMH / PSH: Reviewed  OBJECTIVE:   BP 126/67   Pulse 79   Ht '5\' 3"'$  (1.6 m)   Wt 265 lb 3.2 oz (120.3 kg)   SpO2 99%   BMI 46.98 kg/m   General: NAD, well-appearing, well-nourished Respiratory: No respiratory distress, breathing comfortably, able to speak in full sentences Skin: warm and dry, no rashes noted on exposed skin Psych: Appropriate affect and mood  ASSESSMENT/PLAN:   Depression PHQ-9 score of 15 with positive #9, has no intentions of active plan and has protective factors of younger daughter. 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  Patient previously had improvement with vestibular physical therapy. - Instructed patient to continue therapy exercises at home - If no improvement over the next few weeks, patient can call and we will put in a referral to vestibular rehab     Rise Patience, [place]

## 2022-12-30 NOTE — Patient Instructions (Addendum)
I think the dizziness that you are having is related to the positional vertigo, attached to paperwork is the Epley maneuver.  Tried again at home and trying to do therapy exercises that they gave you when you did the therapy in the past.  If it is not working for you, then just let our office know and we can put in a referral to the rehab center.    [facility] [place], [place], [place] [address]  [phone]   Therapy and Counseling Resources Most providers on this list will take Medicaid. Patients with commercial insurance or Medicare should contact their insurance company to get a list of in network providers.  [employer] (takes children) Location 1: [address], [place], [place] [address] Location 2: [place], [place] [address] [place] ([place] speaking therapist available)(habla espanol)(take medicare and medicaid)  [place], [place], [place] [address], [place] [email or web address] [phone]  [facility]:Psychiatry and Counseling [address] [place], [place] [address] [place], [place], [place] [address]      [phone]  [place] (spanish available) [place],  [address] [place] (take [facility] and medicare) [address] [place],  [address]       [phone]     [place] (virtual only) [phone]  Jinny Blossom [facility] [address] [place], [place]  [facility] Solutions:  [place]. [place] [phone]  [facility] Counsel[facility]:  [place] [address] [place] [phone]  [facility] (under & uninsured) [address] [place] [phone]    [email or web address]    [place] [address]  [place]     [phone]  [facility] [place]     Phone:  [phone]     [place]ruce [place]  [place] [address]      [place], [place] ext [place]: [place], [place], [place]   [place] ([place] therapist) [email or web address]  [place] [address]   [place] [address]    [phone]    [facility]   [address],  [place], [place]   [place]  [place]  [facility]  [address] [place], [place]        [phone]  Open Access/Walk In Clinic under & uninsured  [facility]  [address] [place], [place] Crisis [phone]  [facility] [place],  ([place])   [place], [place]: [phone]) 8:30 - 12; 1 - 2:30  Family Service of the [place],  [place], [place]    [phone]):8:30 - 12; 2 - 3PM  [facility] [employer],  [address]  [place]; [phone]):   Mon - Fri 8 AM - 5 PM  [facility] [place]  MWF 12:30 to 3:00 or call to schedule an appointment  [phone]  Specific Provider options Psychology Today  [email or web address] click on find a therapist  enter your zip  code left side and select or tailor a therapist for your specific need.   [facility] Provider Directory [email or web address]  (Medicaid)   Follow all drop down to find a provider  [place] or [email or web address] [address], [place], [place] Recovery support and educational   24- Hour Availability:   [facility]  [address] [place], [place] Crisis [phone]  Family Service of the [employer] [phone]  [place]  [phone]   [place]  [phone] (after hours)  Therapeutic Alternative/Mobile Crisis   [phone]  [place] National Suicide Hotline  [phone] Diamantina Monks)  Call 911 or go to emergency room  [facility]  [phone]);  [place] and [employer]  [phone]); [place], [place], [place], [place], [place], [place], [name]

## 2022-12-30 NOTE — Assessment & Plan Note (Signed)
PHQ-9 score of 15 with positive #9, has no intentions of active plan and has protective factors of younger daughter. Patient hoping to reestablish with [facility] - Refilled Zoloft prescription - Refilled as needed BuSpar - Therapy resources provided for patient

## 2022-12-30 NOTE — Assessment & Plan Note (Signed)
History consistent with recurrence of BPPV.  Patient previously had improvement with vestibular physical therapy. - Instructed patient to continue therapy exercises at home - If no improvement over the next few weeks, patient can call and we will put in a referral to vestibular rehab

## 2023-01-17 ENCOUNTER — Encounter (HOSPITAL_COMMUNITY): Payer: Self-pay

## 2023-01-17 ENCOUNTER — Emergency Department (HOSPITAL_COMMUNITY): Payer: No Typology Code available for payment source

## 2023-01-17 ENCOUNTER — Other Ambulatory Visit: Payer: Self-pay

## 2023-01-17 ENCOUNTER — Emergency Department (HOSPITAL_COMMUNITY)
Admission: EM | Admit: 2023-01-17 | Discharge: 2023-01-17 | Disposition: A | Discharge status: Home / Self Care | Payer: No Typology Code available for payment source | Attending: Emergency Medicine | Admitting: Emergency Medicine

## 2023-01-17 DIAGNOSIS — Z1152 Encounter for screening for COVID-19: Secondary | ICD-10-CM | POA: Diagnosis not present

## 2023-01-17 DIAGNOSIS — J441 Chronic obstructive pulmonary disease with (acute) exacerbation: Secondary | ICD-10-CM

## 2023-01-17 DIAGNOSIS — R051 Acute cough: Secondary | ICD-10-CM | POA: Diagnosis not present

## 2023-01-17 DIAGNOSIS — R059 Cough, unspecified: Secondary | ICD-10-CM | POA: Diagnosis present

## 2023-01-17 LAB — CBC WITH DIFFERENTIAL/PLATELET
Abs Immature Granulocytes: 0.04 10*3/uL (ref 0.00–0.07)
Basophils Absolute: 0.1 10*3/uL (ref 0.0–0.1)
Basophils Relative: 1 %
Eosinophils Absolute: 0.2 10*3/uL (ref 0.0–0.5)
Eosinophils Relative: 2 %
HCT: 41.3 % (ref 36.0–46.0)
Hemoglobin: 13.3 g/dL (ref 12.0–15.0)
Immature Granulocytes: 0 %
Lymphocytes Relative: 32 %
Lymphs Abs: 3 10*3/uL (ref 0.7–4.0)
MCH: 27.5 pg (ref 26.0–34.0)
MCHC: 32.2 g/dL (ref 30.0–36.0)
MCV: 85.5 fL (ref 80.0–100.0)
Monocytes Absolute: 0.7 10*3/uL (ref 0.1–1.0)
Monocytes Relative: 7 %
Neutro Abs: 5.6 10*3/uL (ref 1.7–7.7)
Neutrophils Relative %: 58 %
Platelets: 261 10*3/uL (ref 150–400)
RBC: 4.83 MIL/uL (ref 3.87–5.11)
RDW: 12.8 % (ref 11.5–15.5)
WBC: 9.6 10*3/uL (ref 4.0–10.5)
nRBC: 0 % (ref 0.0–0.2)

## 2023-01-17 LAB — BASIC METABOLIC PANEL
Anion gap: 7 (ref 5–15)
BUN: 10 mg/dL (ref 6–20)
CO2: 25 mmol/L (ref 22–32)
Calcium: 8.7 mg/dL — ABNORMAL LOW (ref 8.9–10.3)
Chloride: 101 mmol/L (ref 98–111)
Creatinine, Ser: 0.85 mg/dL (ref 0.44–1.00)
GFR, Estimated: >60 mL/min (ref >60)
Glucose, Bld: 98 mg/dL (ref 70–99)
Potassium: 3.6 mmol/L (ref 3.5–5.1)
Sodium: 133 mmol/L — ABNORMAL LOW (ref 135–145)

## 2023-01-17 LAB — RESP PANEL BY RT-PCR (RSV, FLU A&B, COVID)  RVPGX2
Influenza A by PCR: NEGATIVE
Influenza B by PCR: NEGATIVE
Resp Syncytial Virus by PCR: NEGATIVE
SARS Coronavirus 2 by RT PCR: NEGATIVE

## 2023-01-17 LAB — TROPONIN I (HIGH SENSITIVITY): Troponin I (High Sensitivity): 3 ng/L (ref <18)

## 2023-01-17 LAB — LACTIC ACID, PLASMA: Lactic Acid, Venous: 1.2 mmol/L (ref 0.5–1.9)

## 2023-01-17 MED ORDER — METHYLPREDNISOLONE SODIUM SUCC 125 MG IJ SOLR
125.0000 mg | Freq: Once | INTRAMUSCULAR | Status: AC
  Administered 2023-01-17: 125 mg via INTRAVENOUS
  Filled 2023-01-17: qty 2

## 2023-01-17 MED ORDER — PREDNISONE 20 MG PO TABS: 40.0000 mg | ORAL_TABLET | Freq: Every day | ORAL | 0 refills | Status: AC

## 2023-01-17 MED ORDER — IPRATROPIUM-ALBUTEROL 0.5-2.5 (3) MG/3ML IN SOLN
3.0000 mL | Freq: Once | RESPIRATORY_TRACT | Status: AC
  Administered 2023-01-17: 3 mL via RESPIRATORY_TRACT
  Filled 2023-01-17: qty 3

## 2023-01-17 MED ORDER — AZITHROMYCIN 500 MG PO TABS: 500.0000 mg | ORAL_TABLET | Freq: Every day | ORAL | 0 refills | Status: AC

## 2023-01-17 NOTE — ED Provider Notes (Signed)
[facility] Provider Note   CSN: [id number] Arrival date & time: 01/17/23  [id number]     History  Chief Complaint  Patient presents with   Cough    Kylie Garner is a 62 y.o. female with a past medical history significant for COPD, morbid obesity, prediabetes who presents to the ED due to productive cough and fever x 3 weeks.  Last fever was last night which was 101.2 F.  Patient notes her nephew had a visitor who was sick with similar symptoms 3 weeks ago.  Admits to some chest tightness.  Also endorses shortness of breath.  She admits to wheezing.  Last used her albuterol inhaler last night. No lower extremity edema. Denies history of blood clots.   History obtained from patient and past medical records. No interpreter used during encounter.       Home Medications Prior to Admission medications   Medication Sig Start Date End Date Taking? Authorizing Provider  azithromycin (ZITHROMAX) 500 MG tablet Take 1 tablet (500 mg total) by mouth daily for 5 days. Take first 2 tablets together, then 1 every day until finished. 01/17/23 01/22/23 Yes Terissa Haffey, Druscilla Brownie, PA-C  predniSONE (DELTASONE) 20 MG tablet Take 2 tablets (40 mg total) by mouth daily for 5 days. 01/17/23 01/22/23 Yes Khadijah Mastrianni, Druscilla Brownie, PA-C  albuterol (PROAIR HFA) 108 (90 Base) MCG/ACT inhaler Inhale 1-2 puffs into the lungs every 6 (six) hours as needed for wheezing or shortness of breath. 12/30/22   Lilland, Alana, DO  busPIRone (BUSPAR) 5 MG tablet Take 1 tablet (5 mg total) by mouth 3 (three) times daily. 12/30/22   Lilland, Alana, DO  Ibuprofen (ADVIL) 200 MG CAPS Take 400 mg by mouth every 6 (six) hours as needed (for fever).    [provider]  sertraline (ZOLOFT) 100 MG tablet Take 1 tablet (100 mg total) by mouth daily. 12/30/22   Lilland, Alana, DO  umeclidinium bromide (INCRUSE ELLIPTA) 62.5 MCG/ACT AEPB Inhale 1 puff into the lungs daily. 12/30/22   Lilland, Lorrin Goodell, DO       Allergies    Patient has no known allergies.    Review of Systems   Review of Systems  Constitutional:  Positive for chills and fever.  Respiratory:  Positive for cough and shortness of breath.   Cardiovascular:  Positive for chest pain.  Gastrointestinal:  Negative for abdominal pain.    Physical Exam Updated Vital Signs BP (!) 128/55   Pulse 68   Temp 98.1 F (36.7 C)   Resp 10   Ht 5\' 3"  (1.6 m)   Wt 115.7 kg   SpO2 96%   BMI 45.17 kg/m  Physical Exam Vitals and nursing note reviewed.  Constitutional:      General: She is not in acute distress.    Appearance: She is not ill-appearing.  HENT:     Head: Normocephalic.  Eyes:     Pupils: Pupils are equal, round, and reactive to light.  Cardiovascular:     Rate and Rhythm: Normal rate and regular rhythm.     Pulses: Normal pulses.     Heart sounds: Normal heart sounds. No murmur heard.    No friction rub. No gallop.  Pulmonary:     Effort: Pulmonary effort is normal.     Breath sounds: Wheezing and rhonchi present.  Abdominal:     General: Abdomen is flat. There is no distension.     Palpations: Abdomen is soft.  Tenderness: There is no abdominal tenderness. There is no guarding or rebound.  Musculoskeletal:        General: Normal range of motion.     Cervical back: Neck supple.  Skin:    General: Skin is warm and dry.  Neurological:     General: No focal deficit present.     Mental Status: She is alert.  Psychiatric:        Mood and Affect: Mood normal.        Behavior: Behavior normal.     ED Results / Procedures / Treatments   Labs (all labs ordered are listed, but only abnormal results are displayed) Labs Reviewed  BASIC METABOLIC PANEL - Abnormal; Notable for the following components:      Result Value   Sodium 133 (*)    Calcium 8.7 (*)    All other components within normal limits  RESP PANEL BY RT-PCR (RSV, FLU A&B, COVID)  [id number]  CBC WITH DIFFERENTIAL/PLATELET  LACTIC ACID, PLASMA   TROPONIN I (HIGH SENSITIVITY)    EKG None  Radiology DG Chest Portable 1 View  Result Date: 01/17/2023 CLINICAL DATA:  cough EXAM: PORTABLE CHEST - 1 VIEW COMPARISON:  08/05/2022. FINDINGS: The heart size and mediastinal contours are within normal limits. Both lungs are clear. No pneumothorax or pleural effusion. Aorta is calcified. There are thoracic degenerative changes. IMPRESSION: No active cardiopulmonary disease. Electronically Signed   By: Sammie Bench M.D.   On: 01/17/2023 10:20    Procedures Procedures    Medications Ordered in ED Medications  methylPREDNISolone sodium succinate (SOLU-MEDROL) 125 mg/2 mL injection 125 mg (125 mg Intravenous Given 01/17/23 1021)  ipratropium-albuterol (DUONEB) 0.5-2.5 (3) MG/3ML nebulizer solution 3 mL (3 mLs Nebulization Given 01/17/23 1021)    ED Course/ Medical Decision Making/ A&P Clinical Course as of 01/17/23 1251  Sat Jan 17, 2023  1054 Sodium(!): 133 [CA]  1054 WBC: 9.6 [CA]    Clinical Course User Index [CA] Suzy Bouchard, PA-C                             Medical Decision Making Amount and/or Complexity of Data Reviewed Labs: ordered. Decision-making details documented in ED Course. Radiology: ordered and independent interpretation performed. Decision-making details documented in ED Course. ECG/medicine tests: ordered and independent interpretation performed. Decision-making details documented in ED Course.  Risk Prescription drug management.   This patient presents to the ED for concern of cough/fever, this involves an extensive number of treatment options, and is a complaint that carries with it a high risk of complications and morbidity.  The differential diagnosis includes PNA, COPD exacerbation, viral process, etc  62 year old female presents to the ED due to productive cough and fever x 3 weeks.  History of COPD and tobacco use.  Upon arrival, patient afebrile, not tachycardic or hypoxic.  Patient in no  acute distress.  Physical exam significant for slight expiratory wheeze with decreased air movement.  Solu-Medrol and DuoNeb given.  Chest x-ray to rule out evidence of pneumonia.  RVP to rule out infection.  Routine labs ordered. Low suspicion for PE/DVT. Troponin to rule out ACS given chest pain; however appears atypical, likely due to cough/COPD.   CBC unremarkable.  No leukocytosis.  Normal hemoglobin.  BMP significant for hyponatremia 133.  Normal renal function.  Lactic acid normal at 1.2.  Low suspicion for sepsis.  COVID, influenza, RSV negative.  Troponin normal.  EKG demonstrates normal  sinus rhythm.  No signs of acute ischemia. Low suspicion for ACS. Chest x-ray personally viewed interpreted negative for signs of pneumonia.  Patient able to ambulate here in the ED with O2 90%.  Patient notes she feels much improved after DuoNeb and Solu-Medrol.  Shared decision making in regards to admission for COPD exacerbation versus discharge and follow-up with PCP.  Patient notes she feels much better and prefers to go home and follow-up with PCP on Monday.  Suspect patient's oxygen typically around 90% with ambulation at baseline given history of COPD and tobacco use.  No evidence of pneumonia on chest x-ray. No signs of respiratory distress. Lungs with improvement in air movement and wheeze after duoneb. Patient discharged with prednisone and azithromycin.  Follow-up with PCP early next week. Strict ED precautions discussed with patient. Patient states understanding and agrees to plan. Patient discharged home in no acute distress and stable vitals  Discussed with Dr. Truett Mainland who agrees with assessment and plan.   Has PCP       Final Clinical Impression(s) / ED Diagnoses Final diagnoses:  Acute cough  COPD exacerbation ([place])    Rx / DC Orders ED Discharge Orders          Ordered    azithromycin (ZITHROMAX) 500 MG tablet  Daily        01/17/23 1235    predniSONE (DELTASONE) 20 MG tablet   Daily        01/17/23 1235              Karie Kirks 01/17/23 1252    Cristie Hem, MD 01/18/23 1115

## 2023-01-17 NOTE — Discharge Instructions (Signed)
It was a pleasure taking care of you today.  As discussed, your chest x-ray did not show evidence of pneumonia. All of your labs were reassuring. I am sending you home with antibiotics and steroids.  Take as prescribed.  Your oxygen was borderline low here in the ER.  After discussion, you prefer to go home and follow-up with PCP early next week.  Please return if symptoms worsen.

## 2023-01-17 NOTE — ED Triage Notes (Signed)
Productive cough and fever for 3 weeks   Requesting Covid test

## 2023-01-19 ENCOUNTER — Encounter: Payer: Self-pay | Admitting: Family Medicine

## 2023-01-19 ENCOUNTER — Ambulatory Visit (INDEPENDENT_AMBULATORY_CARE_PROVIDER_SITE_OTHER): Payer: No Typology Code available for payment source | Admitting: Family Medicine

## 2023-01-19 VITALS — BP 122/58 | HR 82 | Ht 63.0 in | Wt 261.0 lb

## 2023-01-19 DIAGNOSIS — J441 Chronic obstructive pulmonary disease with (acute) exacerbation: Secondary | ICD-10-CM

## 2023-01-19 DIAGNOSIS — F329 Major depressive disorder, single episode, unspecified: Secondary | ICD-10-CM

## 2023-01-19 DIAGNOSIS — J449 Chronic obstructive pulmonary disease, unspecified: Secondary | ICD-10-CM | POA: Diagnosis not present

## 2023-01-19 DIAGNOSIS — E871 Hypo-osmolality and hyponatremia: Secondary | ICD-10-CM

## 2023-01-19 MED ORDER — UMECLIDINIUM BROMIDE 62.5 MCG/ACT IN AEPB: 1.0000 | INHALATION_SPRAY | Freq: Every day | RESPIRATORY_TRACT | 1 refills | Status: DC

## 2023-01-19 NOTE — Progress Notes (Deleted)
    SUBJECTIVE:   CHIEF COMPLAINT / HPI:   Kylie Garner is a 62 y.o. female who presents to the [facility] clinic today to discuss the following concerns:   ED F/U Patient was seen in the ED on 3/23 for cough, fever.  Has slight expiratory wheezing with decreased air movement on exam.  She was given Solu-Medrol and DuoNebs.  CBC returned without leukocytosis.  She had slight hyponatremia at 133.  Chest x-ray negative for pneumonia.  Ambulatory pulse ox was 90%, this is thought to be baseline for patient.  She was discharged on prednisone and azithromycin.  Returns today for follow-up.  PERTINENT  PMH / PSH: COPD, OSA, class III obesity, tobacco use disorder  OBJECTIVE:   There were no vitals taken for this visit. ***  General: NAD, pleasant, able to participate in exam Cardiac: RRR, no murmurs. Respiratory: CTAB, normal effort, No wheezes, rales or rhonchi Abdomen: Bowel sounds present, nontender, nondistended, no hepatosplenomegaly. Extremities: no edema or cyanosis. Skin: warm and dry, no rashes noted Neuro: alert, no obvious focal deficits Psych: Normal affect and mood  ASSESSMENT/PLAN:   No problem-specific Assessment & Plan notes found for this encounter.     Sharion Settler, [place]

## 2023-01-19 NOTE — Progress Notes (Addendum)
    SUBJECTIVE:   CHIEF COMPLAINT / HPI:   HFU: The patient was seen at the ED last week for COPD exacerbation. She was sent home on Zithromax and Prednisone with her last does in 3 days. She takes her Incruse Ellipta as needed as well as albuterol prn SOB and wheezing. She felt much better on oral steroids and Zithromax and asked for a refill of her prednisone to use as needed with a flare.   Depression: Denies SI today. Her last SI was two months ago. She is compliant with her Zoloft, Buspar, and psychology f/u. Her next therapy appointment is in 1 week. No new concerns.   PERTINENT  PMH / PSH: PMHx reviewed  OBJECTIVE:   BP (!) 122/58   Pulse 82   Ht 5\' 3"  (1.6 m)   Wt 261 lb (118.4 kg)   SpO2 96%   BMI 46.23 kg/m   Physical Exam Vitals and nursing note reviewed.  Cardiovascular:     Rate and Rhythm: Normal rate and regular rhythm.     Heart sounds: Normal heart sounds. No murmur heard. Pulmonary:     Effort: Pulmonary effort is normal. No respiratory distress.     Breath sounds: Normal breath sounds. No stridor. No wheezing or rhonchi.      ASSESSMENT/PLAN:   COPD exacerbation ([place]) S/P ED visit for COPD exacerbation, which is likely due to poor medication adherence. She takes her Incruse Ellipta prn instead of daily. Education was provided regarding appropriate use. Incruse refilled.  Complete the A/B and steroids course as planned. F/U with PCP for reassessment and medication adjustment. May benefit from LABA/LAMA if she fails Incruse despite appropriate use. I reviewed PFT from 2016, which showed restrictive lung disease and not obstructive. I have referred her to Dr. Valentina Lucks for repeat PFT to guide therapy adjustment. ED precaution discussed. Note that her sodium was mildly low during her ED visit. Bmet rechecked today. I will contact her soon with her results. I advised her to reach out to PCP as needed for oral prednisone if her inhaler treatment fails. She  agreed with the plan.   Depression No active SI. Stable on Zoloft and psychotherapy. F/U with therapist as planned. Return and SI precautions discussed.   Andrena Mews, MD [place]

## 2023-01-19 NOTE — Assessment & Plan Note (Addendum)
S/P ED visit for COPD exacerbation, which is likely due to poor medication adherence. She takes her Incruse Ellipta prn instead of daily. Education was provided regarding appropriate use. Incruse refilled.  Complete the A/B and steroids course as planned. F/U with PCP for reassessment and medication adjustment. May benefit from LABA/LAMA if she fails Incruse despite appropriate use. I reviewed PFT from 2016, which showed restrictive lung disease and not obstructive. I have referred her to Dr. Valentina Lucks for repeat PFT to guide therapy adjustment. ED precaution discussed. Note that her sodium was mildly low during her ED visit. Bmet rechecked today. I will contact her soon with her results. I advised her to reach out to PCP as needed for oral prednisone if her inhaler treatment fails. She agreed with the plan.

## 2023-01-19 NOTE — Patient Instructions (Addendum)
Umeclidinium Dry Powder Inhaler (DPI) What is this medication? UMECLIDINIUM (ue MEK li DIN ee um) treats chronic obstructive pulmonary disease (COPD). It works by opening the airways of the lungs, making it easier to breathe. It is often called a controller inhaler. Do not use it to treat a sudden COPD flare-up. This medicine may be used for other purposes; ask your health care provider or pharmacist if you have questions. COMMON BRAND NAME(S): INCRUSE ELLIPTA What should I tell my care team before I take this medication? They need to know if you have any of these conditions: Bladder problems or difficulty passing urine Glaucoma Prostate trouble An unusual or allergic reaction to umeclidinium, lactose, milk proteins, other medications, foods, dyes, or preservatives Pregnant or trying to get pregnant Breast-feeding How should I use this medication? This medication is inhaled through the mouth. Take it as directed on the prescription label at the same time every day. Do not use it more often than directed. Keep taking it unless your care team tells you to stop. A patient package insert for the product will be given with each prescription and refill. Be sure to read this information carefully each time. The sheet may change often. This medication comes with INSTRUCTIONS FOR USE. Ask your pharmacist for directions on how to use this medication. Read the information carefully. Talk to your pharmacist or care team if you have questions. Talk to your care team about the use of this medication in children. This medication is not approved for use in children. Overdosage: If you think you have taken too much of this medicine contact a poison control center or emergency room at once. NOTE: This medicine is only for you. Do not share this medicine with others. What if I miss a dose? If you miss a dose, take it as soon as you can. If it is almost time for your next dose, take only that dose. Do not take  double or extra doses. What may interact with this medication? This medication may interact with the following: Antihistamines for allergy, cough and cold Atropine Certain medications for bladder problems like oxybutynin, tolterodine Certain medications for stomach problems like dicyclomine, hyoscyamine Certain medications for travel sickness like scopolamine Certain medications for Parkinson disease like benztropine, trihexyphenidyl Ipratropium Tiotropium This list may not describe all possible interactions. Give your health care provider a list of all the medicines, herbs, non-prescription drugs, or dietary supplements you use. Also tell them if you smoke, drink alcohol, or use illegal drugs. Some items may interact with your medicine. What should I watch for while using this medication? Visit your care team for regular checkups. Tell your care team if your symptoms do not get better. If your symptoms get worse or if you need your short-acting inhalers more often, call your care team right away. Do not use this medication more than once every 24 hours. What side effects may I notice from receiving this medication? Side effects that you should report to your care team as soon as possible: Allergic reactions--skin rash, itching, hives, swelling of the face, lips, tongue, or throat Sudden eye pain or change in vision such as blurry vision, seeing halos around lights, vision loss Trouble passing urine Wheezing or trouble breathing that is worse after use Side effects that usually do not require medical attention (report to your care team if they continue or are bothersome): Constipation Cough Dry mouth Headache This list may not describe all possible side effects. Call your doctor for medical advice  about side effects. You may report side effects to FDA at 1-800-FDA-1088. Where should I keep my medication? Keep out of the reach of children and pets. Store at room temperature between 20 and  25 degrees C (68 and 77 degrees F). Protect from light. Keep inhaler away from extreme heat, cold or humidity. Get rid of it 6 weeks after removing it from the foil pouch, when the dose counter reads "0" or after the expiration date, whichever is first. To get rid of medication that are no longer needed or have expired: Take the medication to a medication take-back program. Check with your pharmacy or law enforcement to find a location. If you cannot return the medication, ask your pharmacist or care team how to get rid of this medication safely. NOTE: This sheet is a summary. It may not cover all possible information. If you have questions about this medicine, talk to your doctor, pharmacist, or health care provider.  2023 Elsevier/Gold Standard (2021-07-14 00:00:00)

## 2023-01-19 NOTE — Assessment & Plan Note (Signed)
No active SI. Stable on Zoloft and psychotherapy. F/U with therapist as planned. Return and SI precautions discussed.

## 2023-01-20 ENCOUNTER — Telehealth: Payer: Self-pay | Admitting: Family Medicine

## 2023-01-20 LAB — BASIC METABOLIC PANEL
BUN/Creatinine Ratio: 24 (ref 12–28)
BUN: 21 mg/dL (ref 8–27)
CO2: 21 mmol/L (ref 20–29)
Calcium: 9.2 mg/dL (ref 8.7–10.3)
Chloride: 104 mmol/L (ref 96–106)
Creatinine, Ser: 0.87 mg/dL (ref 0.57–1.00)
Glucose: 103 mg/dL — ABNORMAL HIGH (ref 70–99)
Potassium: 4.7 mmol/L (ref 3.5–5.2)
Sodium: 142 mmol/L (ref 134–144)
eGFR: 76 mL/min/{1.73_m2} (ref >59)

## 2023-01-20 NOTE — Telephone Encounter (Signed)
HIPAA compliant callback message left.   Please let her know that her sodium level returned back to normal.

## 2023-02-03 ENCOUNTER — Other Ambulatory Visit: Payer: Self-pay | Admitting: Family Medicine

## 2023-02-03 DIAGNOSIS — F329 Major depressive disorder, single episode, unspecified: Secondary | ICD-10-CM

## 2023-02-05 ENCOUNTER — Ambulatory Visit: Payer: No Typology Code available for payment source | Admitting: Pharmacist

## 2023-02-10 ENCOUNTER — Ambulatory Visit: Payer: No Typology Code available for payment source | Admitting: Pharmacist

## 2023-02-12 ENCOUNTER — Ambulatory Visit: Payer: Self-pay | Admitting: Student

## 2023-02-12 NOTE — Progress Notes (Deleted)
  SUBJECTIVE:   CHIEF COMPLAINT / HPI:   COPD Meds: Incruise Elipta, albuterol  Tobacco abuse?  Depression Meds: Zoloft 100 mg  Prediabetes Lab Results  Component Value Date   HGBA1C 5.7 (A) 03/14/2022   OSA Was to f/u w/ sleep medicine for sleep study  PERTINENT  PMH / PSH: ***  Past Medical History:  Diagnosis Date   Anxiety    Bilateral leg edema 09/07/2017   COPD (chronic obstructive pulmonary disease) (HCC)    Depression    Encounter for screening mammogram for malignant neoplasm of breast 03/14/2020   Headache 04/19/2018   Hypertension     Patient Care Team: Bess Kinds, MD as PCP - General (Family Medicine) OBJECTIVE:  There were no vitals taken for this visit. Physical Exam   ASSESSMENT/PLAN:  There are no diagnoses linked to this encounter. No follow-ups on file. Bess Kinds, MD 02/12/2023, 12:14 PM PGY-***, [facility] {    This will disappear when note is signed, click to select method of visit    :1}

## 2023-02-22 ENCOUNTER — Other Ambulatory Visit: Payer: Self-pay | Admitting: Family Medicine

## 2023-02-22 DIAGNOSIS — F329 Major depressive disorder, single episode, unspecified: Secondary | ICD-10-CM

## 2023-06-02 ENCOUNTER — Ambulatory Visit (INDEPENDENT_AMBULATORY_CARE_PROVIDER_SITE_OTHER): Payer: 59 | Admitting: Student

## 2023-06-02 ENCOUNTER — Other Ambulatory Visit: Payer: Self-pay

## 2023-06-02 ENCOUNTER — Ambulatory Visit: Payer: No Typology Code available for payment source | Admitting: Student

## 2023-06-02 VITALS — BP 115/54 | HR 66 | Ht 63.0 in | Wt 259.6 lb

## 2023-06-02 DIAGNOSIS — F329 Major depressive disorder, single episode, unspecified: Secondary | ICD-10-CM | POA: Diagnosis not present

## 2023-06-02 DIAGNOSIS — R251 Tremor, unspecified: Secondary | ICD-10-CM | POA: Diagnosis not present

## 2023-06-02 DIAGNOSIS — R42 Dizziness and giddiness: Secondary | ICD-10-CM | POA: Diagnosis not present

## 2023-06-02 MED ORDER — BUSPIRONE HCL 10 MG PO TABS: 10.0000 mg | ORAL_TABLET | Freq: Two times a day (BID) | ORAL | 0 refills | Status: DC

## 2023-06-02 MED ORDER — SERTRALINE HCL 100 MG PO TABS: 100.0000 mg | ORAL_TABLET | Freq: Every day | ORAL | 1 refills | Status: DC

## 2023-06-02 NOTE — Assessment & Plan Note (Signed)
Hands start to shake when patient has stressful episodes, dissipate when she is able to calm down.  Does not appear to have intentional tremor.  Not at goal neurologic examination, return in 3 weeks after increasing BuSpar and providing therapy resources to help with anxiety and stressful situations.

## 2023-06-02 NOTE — Progress Notes (Signed)
SUBJECTIVE:   CHIEF COMPLAINT / HPI:   Dizziness Episodes: Onset 3 weeks ago Happens when going to lay down, happens when sitting up as well but resolves  Has to hold onto the wall for a while after standing  Head trauma: No  Spinning sensation: No, like hangover symptoms  Syncope: No Heart palpitations: No Hypoglycemia: Not to her knowledge  Heart concerns, dyspnea: COPD--sometimes  Hearing loss: No Ringing in ears: No  Nausea or vomiting No  Trying therapy with Epley Maneuvers and seeing no benefit   Has history of BPPV, started about 2 years ago and was present when turning her head to the side after laying down on last visit regarding this concern. Previously had been told that she could be sent to vestibular rehab if continued.   Shaking Episodes: --Notices shaking episodes in stressful situations only in hands bilaterally  --Relates that to her stress level, goes away once she is able to calm down  --has been very stressed in her living environment as her nephew lives with her with his girlfriend and they get into arguments frequently. -Does not drink caffeine  --Drinks water regularly --2 weeks ongoing  --Lasts about 30 minutes  -- Would like to try therapy   PERTINENT  PMH / PSH:  COPD OSA BPPV Depression Obesity Tobacco use  Prediabetes   Patient Care Team: Bess Kinds, MD as PCP - General (Family Medicine) OBJECTIVE:  BP (!) 115/54   Pulse 66   Ht 5\' 3"  (1.6 m)   Wt 259 lb 9.6 oz (117.8 kg)   SpO2 96%   BMI 45.99 kg/m   Orthostatic VS for the past 24 hrs (Last 3 readings):  BP- Lying Pulse- Lying BP- Sitting Pulse- Sitting BP- Standing at 0 minutes Pulse- Standing at 0 minutes  06/02/23 1346 134/66 63 (!) 76/42 69 123/71 76   Physical Exam  General: NAD, pleasant, able to participate in exam Card: RRR without M/G/R  Respiratory: No respiratory distress Skin: warm and dry, no rashes noted Psych: Normal affect and mood Neuro: CN II:  PERRL CN III, IV,VI: EOMI CV V: Normal sensation in V1, V2, V3 CVII: Symmetric smile and brow raise CN VIII: Normal hearing CN IX,X: Symmetric palate raise  CN XI: 5/5 shoulder shrug CN XII: Symmetric tongue protrusion  UE and LE strength 5/5 Normal sensation in UE and LE bilaterally  No ataxia with finger to nose Unsteady with [place]berg but rights self appropriately    ASSESSMENT/PLAN:  Dizziness Assessment & Plan: Ddx: hyperventilation, hypercarbia, vertigo, metabolic, cardiac etiology, psychiatric, CVA, orthostatic hypotension, anemia. No history of anemia or symptoms of bleeding, no evidence of hypoglycemic episodes in the past and has a normal diet, no history of cardiac etiology without evidence of arrhythmias, patient with intermittent symptoms and positional making this likely related to positional vertigo in addition to some orthostatic hypotension as evident by orthostatic vital signs obtained.  Otherwise vital signs stable which is reassuring against the possibility of hypercarbia or hyperventilation.  Neurologically intact, reassuring against cerebellar stroke in addition to only temporary symptoms. Will continue with vestibular rehab in addition to Epley maneuvers at home.  Also, instructed to obtain compression stockings, increase salt and remain hydrated.  Cautious with sitting upright and going to standing position.  Return in 3 weeks for further evaluation.  Orders: -     PT Vestibular Evaluation; Future  Reactive depression Assessment & Plan: Consider taper up Buspar if tolerated: With shared decision making, decided to increase BuSpar  to 10 Mg twice daily from 5 Mg 3 times daily. Therapist options based on patients insurance provided  Patient without SI/HI; denies active plan and reports that they are safe to continue with outpatient treatment.  Close follow up 3 weeks   Orders: -     Sertraline HCl; Take 1 tablet (100 mg total) by mouth daily.  Dispense: 90 tablet;  Refill: 1 -     busPIRone HCl; Take 1 tablet (10 mg total) by mouth 2 (two) times daily.  Dispense: 60 tablet; Refill: 0  Shaking Assessment & Plan: Hands start to shake when patient has stressful episodes, dissipate when she is able to calm down.  Does not appear to have intentional tremor.  Not at goal neurologic examination, return in 3 weeks after increasing BuSpar and providing therapy resources to help with anxiety and stressful situations.     Alfredo Martinez, MD 06/02/2023, 2:09 PM PGY-3, [facility]

## 2023-06-02 NOTE — Assessment & Plan Note (Signed)
Ddx: hyperventilation, hypercarbia, vertigo, metabolic, cardiac etiology, psychiatric, CVA, orthostatic hypotension, anemia. No history of anemia or symptoms of bleeding, no evidence of hypoglycemic episodes in the past and has a normal diet, no history of cardiac etiology without evidence of arrhythmias, patient with intermittent symptoms and positional making this likely related to positional vertigo in addition to some orthostatic hypotension as evident by orthostatic vital signs obtained.  Otherwise vital signs stable which is reassuring against the possibility of hypercarbia or hyperventilation.  Neurologically intact, reassuring against cerebellar stroke in addition to only temporary symptoms. Will continue with vestibular rehab in addition to Epley maneuvers at home.  Also, instructed to obtain compression stockings, increase salt and remain hydrated.  Cautious with sitting upright and going to standing position.  Return in 3 weeks for further evaluation.

## 2023-06-02 NOTE — Patient Instructions (Addendum)
It was great to see you today! Thank you for choosing [facility] for your primary care.  Today we addressed: Dizziness -- I have sent a referral to the vestibular rehab physical therapist, they will call you Stay hydrated! See below for Medicaid resources for therapy  Consider increasing Buspar if needed  Eating and drinking  Drink enough fluid to keep your urine pale yellow. Eat extra salt only as directed. Do not add extra salt to your diet unless advised by your health care provider. Eat frequent, small meals. Avoid standing up suddenly after eating. General instructions  Get up slowly from lying down or sitting positions. This gives your blood pressure a chance to adjust. Avoid hot showers and excessive heat as directed by your health care provider. Engage in regular physical activity as directed by your health care provider. If you have compression stockings, wear them as told. Keep all follow-up visits. This is important. Contact a health care provider if: You have a fever for more than 2-3 days. You feel more thirsty than usual. You feel dizzy or weak.   Therapy and Counseling Resources Most providers on this list will take Medicaid. Patients with commercial insurance or Medicare should contact their insurance company to get a list of in network providers.  [facility] (spanish speaking therapist available)(habla espanol)(take medicare and medicaid)  [address], [place], [place] [address], [email or web address] [email or web address] [phone]  Best[facility]:Psychiatry and Counseling [address] [facility] [address] [place], [place] [address] [phone]  [facility]   [address] Suite [place], [place] [address]      [phone]  [facility] (spanish available) [address]  [place], [place] [address] [phone]  [facility] (take [facility] and medicare) [address]  [place], [place] [address]        [phone]     [facility] (virtual only) [phone]  [facility]uss[facility] [address] [place], [place] [phone]  [facility]:  [address] [place] [phone]  [facility]:  [address] [place] [phone]  [facility] (under & uninsured) [address]   [place] [phone]    [email or web address]    [facility] [phone] [address]  [place]    [phone]  [facility] -[address]     Phone:  [phone]     [facility]-  [address]  [phone]   [facility] [address]      [place], [place] [phone]  [facility]: [address] [place], [place], [place]  [phone]   [facility] (Spanish therapist) [email or web address]  [address] [place]  [address]   [place] [address]    [phone]    [facility]   [address],  [place], [place]  [phone]   [facility]  [address] [place]  [phone]  [facility]  [address] [place], [place]        [phone]  Open Access/Walk In Clinic under & uninsured  [facility]  [address] [place], [place] [phone] Crisis [phone]  [facility] [place],  (Spanish)   [address], [place]: [phone]) 8:30 - 12; 1 - 2:30  [facility] [employer],  [address] Floor,  [place]     ([phone]):8:30 - 12; 2 - 3PM  [facility] [place],  [address]  [place]; [phone]):   Mon - Fri 8 AM - 5 PM  [facility] [address] [place]  MWF 12:30 to 3:00 or call to schedule an appointment  [phone]  Specific Provider options Psychology Today  [email or web address].psychologytoday.com/us click on find a  therapist  enter your zip code left side and select or tailor a therapist for your specific need.   [facility] Provider Directory [email or web address]  (Medicaid)   Follow all drop down to find a provider  Social Support program Mental Health [place] [phone] or PhotoSolver.pl [address], [place], [place] Recovery support and educational   24- Hour Availability:   [facility]  [address] [place], [place] [phone] Crisis [phone]  [facility] [phone]  [place] [facility]  [phone]   [facility]  [phone] (after hours)  Therapeutic Alternative/Mobile Crisis   [phone]  [place] National Suicide Hotline  [phone] Len Childs)  Call 911 or go to emergency room  [facility]  [phone]);  [place] and [employer]  [phone]); [place], [place], [place], [place], [place], [place], [place]   If you haven't already, sign up for My Chart to have easy access to your labs results, and communication with your primary care physician. I recommend that you always bring your medications to each appointment as this makes it easy to ensure you are on the correct medications and helps [place] not miss refills when you need them. Call the clinic at [phone] if your symptoms worsen or you have any concerns. No follow-ups on file. Please arrive 15 minutes before your appointment to ensure smooth check in process.  We appreciate your efforts in making this happen.  Thank you for allowing me to participate in your care, Alfredo Martinez, MD 06/02/2023, 1:55 PM PGY-3, [facility]

## 2023-06-02 NOTE — Assessment & Plan Note (Signed)
Consider taper up Buspar if tolerated: With shared decision making, decided to increase BuSpar to 10 Mg twice daily from 5 Mg 3 times daily. Therapist options based on patients insurance provided  Patient without SI/HI; denies active plan and reports that they are safe to continue with outpatient treatment.  Close follow up 3 weeks

## 2023-06-23 ENCOUNTER — Ambulatory Visit: Payer: 59 | Admitting: Student

## 2023-06-23 NOTE — Progress Notes (Deleted)
  SUBJECTIVE:   CHIEF COMPLAINT / HPI:   Depression  Anxious Mood:  -Decided to increase Buspar on last visit, 10 mg twice a day from 5 mg TID  -Additionally, on Sertraline 100 mg daily  -Reports today ***  PERTINENT  PMH / PSH:  COPD OSA BPPV Depression Obesity Tobacco use  Prediabetes    Patient Care Team: Bess Kinds, MD as PCP - General (Family Medicine) OBJECTIVE:  There were no vitals taken for this visit. Physical Exam   ASSESSMENT/PLAN:  There are no diagnoses linked to this encounter. No follow-ups on file. Kylie Martinez, MD 06/23/2023, 2:21 PM PGY-3, [facility] {    This will disappear when note is signed, click to select method of visit    :1}

## 2023-07-25 ENCOUNTER — Other Ambulatory Visit: Payer: Self-pay | Admitting: Student

## 2023-07-25 DIAGNOSIS — F329 Major depressive disorder, single episode, unspecified: Secondary | ICD-10-CM

## 2023-08-05 ENCOUNTER — Encounter: Payer: Self-pay | Admitting: Physician Assistant

## 2023-08-05 ENCOUNTER — Other Ambulatory Visit (INDEPENDENT_AMBULATORY_CARE_PROVIDER_SITE_OTHER): Payer: Medicaid Other

## 2023-08-05 ENCOUNTER — Ambulatory Visit (INDEPENDENT_AMBULATORY_CARE_PROVIDER_SITE_OTHER): Payer: Medicaid Other | Admitting: Physician Assistant

## 2023-08-05 VITALS — Ht 60.0 in | Wt 269.4 lb

## 2023-08-05 DIAGNOSIS — M25561 Pain in right knee: Secondary | ICD-10-CM

## 2023-08-05 DIAGNOSIS — G8929 Other chronic pain: Secondary | ICD-10-CM

## 2023-08-05 MED ORDER — LIDOCAINE HCL 1 % IJ SOLN
2.0000 mL | INTRAMUSCULAR | Status: AC | PRN
Start: 2023-08-05 — End: 2023-08-05
  Administered 2023-08-05: 2 mL

## 2023-08-05 MED ORDER — METHYLPREDNISOLONE ACETATE 40 MG/ML IJ SUSP
80.0000 mg | INTRAMUSCULAR | Status: AC | PRN
Start: 2023-08-05 — End: 2023-08-05
  Administered 2023-08-05: 80 mg via INTRA_ARTICULAR

## 2023-08-05 MED ORDER — BUPIVACAINE HCL 0.25 % IJ SOLN
2.0000 mL | INTRAMUSCULAR | Status: AC | PRN
Start: 2023-08-05 — End: 2023-08-05
  Administered 2023-08-05: 2 mL via INTRA_ARTICULAR

## 2023-08-05 NOTE — Progress Notes (Signed)
Office Visit Note   Patient: Kylie Garner           Date of Birth: [birth year]           MRN: [id number] Visit Date: 08/05/2023              Requested by: Bess Kinds, MD [address] [place],  [place] [address] PCP: Bess Kinds, MD   Assessment & Plan: Visit Diagnoses:  1. Chronic pain of right knee     Plan: Is a pleasant 62 year old woman who has previously seen Dr. Magnus Ivan for her right knee.  She has a history of right knee surgery and arthroscopy.  In 2017 she was given an intra-articular injection into her knee and it seemed to help her quite a bit.  She has had the return of the right knee pain.  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  No new injury.  Findings consistent with tricompartmental arthritis.  Her current BMI is 52.  We discussed that she would need to strive to be under 40 to consider knee replacement which is her ultimate goal in the meantime I will go forward and give her another injection today  Follow-Up Instructions: No follow-ups on file.   Orders:  Orders Placed This Encounter  Procedures  . XR Knee 1-2 Views Right   No orders of the defined types were placed in this encounter.     Procedures: Large Joint Inj: R knee on 08/05/2023 3:19 PM Indications: pain and diagnostic evaluation Details: 25 G 1.5 in needle, anteromedial approach  Arthrogram: No  Medications: 80 mg methylPREDNISolone acetate 40 MG/ML; 2 mL lidocaine 1 %; 2 mL bupivacaine 0.25 % Outcome: tolerated well, no immediate complications Procedure, treatment alternatives, risks and benefits explained, specific risks discussed. Consent was given by the patient.     Clinical Data: No additional findings.   Subjective: Chief Complaint  Patient presents with  . Right Knee - Pain    HPI Pleasant 62 year old woman no new injury history of right knee arthritis has had injections in the past and done well rates her pain is moderate Review of  Systems  All other systems reviewed and are negative.    Objective: Vital Signs: There were no vitals taken for this visit.  Physical Exam Constitutional:      Appearance: Normal appearance.  Pulmonary:     Effort: Pulmonary effort is normal.     Breath sounds: Normal breath sounds.  Skin:    General: Skin is warm and dry.  Neurological:     General: No focal deficit present.     Mental Status: She is alert and oriented to person, place, and time.    Ortho Exam Right knee mild soft tissue swelling no effusion no erythema compartments are soft and compressible she is neurovascular intact no evidence of infection she does have crepitus with range of motion Specialty Comments:  No specialty comments available.  Imaging: No results found.   PMFS History: Patient Active Problem List   Diagnosis Date Noted  . Dizziness 06/02/2023  . Shaking 06/02/2023  . OSA (obstructive sleep apnea) 06/04/2022  . 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  . Prediabetes 07/20/2018  . BPPV (benign paroxysmal positional vertigo) 02/17/2018  . Pain in right knee 09/15/2016  . COPD exacerbation (HCC) 11/06/2014  . Depression 11/06/2014  . Morbid obesity (HCC) 11/06/2014  . Tobacco abuse 11/06/2014   Past Medical History:  Diagnosis Date  . Anxiety   . Bilateral leg edema 09/07/2017  . COPD (chronic obstructive pulmonary disease) (HCC)   . Depression   . Encounter for screening mammogram for malignant neoplasm of breast 03/14/2020  . Headache 04/19/2018  . Hypertension     Family History  Problem Relation Age of Onset  . Heart disease Father   . Diabetes Father     Past Surgical History:  Procedure Laterality Date  . APPENDECTOMY    . CESAREAN SECTION  10/28/1991  . KNEE CARTILAGE SURGERY Left    Social History   Occupational History  . Not on file  Tobacco Use  . Smoking status: Some Days    Current packs/day: 0.10    Average packs/day: 0.1  packs/day for 46.8 years (4.7 ttl pk-yrs)    Types: Cigarettes    Start date: 10/27/1976  . Smokeless tobacco: Never  . Tobacco comments:    smokes 1 cig per day up to 1 PPD when stressed. Set quit date for 12/03/15  Vaping Use  . Vaping status: Never Used  Substance and Sexual Activity  . Alcohol use: Yes    Alcohol/week: 0.0 standard drinks of alcohol  . Drug use: No  . Sexual activity: Not on file

## 2023-09-01 ENCOUNTER — Other Ambulatory Visit: Payer: Self-pay | Admitting: Student

## 2023-10-01 ENCOUNTER — Ambulatory Visit (INDEPENDENT_AMBULATORY_CARE_PROVIDER_SITE_OTHER): Payer: Medicaid Other | Admitting: Family Medicine

## 2023-10-01 ENCOUNTER — Encounter: Payer: Self-pay | Admitting: Family Medicine

## 2023-10-01 VITALS — BP 123/62 | HR 98 | Ht 60.0 in | Wt 279.0 lb

## 2023-10-01 DIAGNOSIS — J441 Chronic obstructive pulmonary disease with (acute) exacerbation: Secondary | ICD-10-CM | POA: Diagnosis not present

## 2023-10-01 DIAGNOSIS — R7303 Prediabetes: Secondary | ICD-10-CM | POA: Diagnosis present

## 2023-10-01 DIAGNOSIS — Z23 Encounter for immunization: Secondary | ICD-10-CM

## 2023-10-01 DIAGNOSIS — F329 Major depressive disorder, single episode, unspecified: Secondary | ICD-10-CM

## 2023-10-01 LAB — POCT GLYCOSYLATED HEMOGLOBIN (HGB A1C): HbA1c, POC (prediabetic range): 5.7 % (ref 5.7–6.4)

## 2023-10-01 MED ORDER — INCRUSE ELLIPTA 62.5 MCG/ACT IN AEPB: 1.0000 | INHALATION_SPRAY | Freq: Every day | RESPIRATORY_TRACT | 5 refills | Status: DC

## 2023-10-01 MED ORDER — SERTRALINE HCL 100 MG PO TABS
100.0000 mg | ORAL_TABLET | Freq: Every day | ORAL | 3 refills | Status: DC
Start: 2023-10-01 — End: 2023-11-26

## 2023-10-01 MED ORDER — BUSPIRONE HCL 10 MG PO TABS
10.0000 mg | ORAL_TABLET | Freq: Two times a day (BID) | ORAL | 3 refills | Status: DC
Start: 2023-10-01 — End: 2023-11-26

## 2023-10-01 NOTE — Assessment & Plan Note (Signed)
A1c within normal limits today.  Encouraged lifestyle interventions to maintain glycemic control

## 2023-10-01 NOTE — Assessment & Plan Note (Signed)
Refill provided for Incruse Ellipta.  Counseled on smoking cessation.  Currently contemplative

## 2023-10-01 NOTE — Patient Instructions (Signed)
Good to see you today - Thank you for coming in  Things we discussed today:  1) you are due for a Pap smear.  Please schedule an appointment for Pap smear in the next 1 to 2 months.  2) You are due for a mammogram.  Please call the [facility] to schedule a mammogram.  [facility] [address] [place],  [place]  [address] [phone]   3) For your depression - I recommend going to PsychologyToday.com to search for a therapist in your area. Ask if they accept your insurance.  - Below are some places that accept Medicaid specifically   Therapy and Counseling Resources Most providers on this list will take Medicaid. Patients with commercial insurance or Medicare should contact their insurance company to get a list of in network providers.  [facility] (takes children) Location 1: [address] [place], [place] [address] Location 2: [address] [place], [place] [address] [phone]   [facility] (spanish speaking therapist available)(habla espanol)(take medicare and medicaid)  [address], [place], [place] [address], [email or web address] [email or web address] [phone]  [facility]:Psychiatry and Counseling [address] [facility] [address] [place], [place] [address] [phone]  [facility]   [address] [place], [place] [address]      [phone]  [facility] (spanish available) [address]  [place], [place] [address] [phone]  [facility] (take [facility] and medicare) [address]  [place], [place] [address]       [phone]     [facility] (virtual only) [phone]  [facility] [address], [place], [place] [phone]  [facility]:  [address] [place] [phone]  [facility]:  [address] [place] [phone]  [facility] (under &  uninsured) [address]   [place] [phone]    [email or web address]    [facility] [address]  [place]    [phone]  [facility] -[address]     Phone:  [phone]     [facility]-  [address]  [phone]   [facility] [address]      [place], [place] [phone]  [facility]: [address] [place], [place], [place]  [phone]   [facility] (Spanish therapist) [email or web address]  [address] [place]  [address]   [place] [address]    [phone]    [facility]   [address],  [place], [place]  [phone]   [facility]  [address] [place]  [phone]  [facility]  [address] [place], [place]        [phone]  Open Access/Walk In Clinic under & uninsured  [facility]  [address] [place], [place] [phone] Crisis [phone]  [facility] [place],  (Spanish)   [address], [place]: [phone]) 8:30 - 12; 1 - 2:30  [facility] [employer],  [address] [place], [place]    [phone]):8:30 - 12; 2 - 3PM  [facility] [employer],  [address]  [place]; [phone]):   Mon - Fri 8 AM - 5 PM  [facility] [address] [place]  [place]  12:30 to 3:00 or call to schedule an appointment  [phone]  Specific Provider options Psychology Today  https://www.psychologytoday.com/us click on find a therapist  enter your zip code left side and select or tailor a therapist for your specific need.   [facility] Provider Directory [email or web address]  (Medicaid)   Follow all drop down to find a provider  Social Support program Mental Health [place] [phone] or PhotoSolver.pl [address], [place], [place] Recovery support and educational   24- Hour Availability:   [facility]  [address] [place], [place] [phone] Crisis [phone]  [facility] [phone]  [place] [facility]  [phone]   [facility]  [phone] (after hours)  Therapeutic Alternative/Mobile Crisis   [phone]  [place] National Suicide Hotline  [phone] Len Childs)  Call 911 or go to emergency room  [facility]  [phone]);  [place] and [employer]  [phone]); [place], [place], [place], [place], [place], [place], [place]

## 2023-10-01 NOTE — Assessment & Plan Note (Addendum)
PHQ-9 question 9 positive.  Denies active SI or intention.  Reports that if she had suicidal thoughts, she would reach out to [phone].  Provided resources on finding therapist - refill provided for sertraline

## 2023-10-01 NOTE — Progress Notes (Cosign Needed Addendum)
    SUBJECTIVE:   CHIEF COMPLAINT / HPI:   Kylie Garner is a 62yo F w/ hx of COPD, depression, prediabetes that p/f flu shot and refills.  COPD Needs refills on COPD inhaler (incruse ellipa). Advised to call us if she has trouble affording this, we can send alternatives.  - Smoke occasionally, on and off. Pt is interested in quitting at some point. She knows its bad for her health.   Depression - Reports depressed mood and stress.  - Reports passive suicidal thoughts, but no current plans or intention. - is interested in therapy as well  HM - pap - mammo   OBJECTIVE:   BP 123/62   Pulse 98   Ht 5' (1.524 m)   Wt 279 lb (126.6 kg)   SpO2 97%   BMI 54.49 kg/m    General: Alert, pleasant woman. NAD. HEENT: NCAT. MMM. CV: RRR, no murmurs. Resp: CTAB, no wheezing or crackles. Normal WOB on RA.  Abm: Soft, nontender, nondistended. BS present. Ext: Moves all ext spontaneously Skin: Warm, well perfused   ASSESSMENT/PLAN:   Assessment & Plan Prediabetes A1c within normal limits today.  Encouraged lifestyle interventions to maintain glycemic control Encounter for immunization Flu shot given today Reactive depression PHQ-9 question 9 positive.  Denies active SI or intention.  Reports that if she had suicidal thoughts, she would reach out to [phone].  Provided resources on finding therapist - refill provided for sertraline COPD exacerbation (HCC) Refill provided for Incruse Ellipta.  Counseled on smoking cessation.  Currently contemplative   Advised to get mammogram for breast cancer screening.  Advised to follow-up with PCP for Pap smear.  Lincoln Brigham, MD [facility]

## 2023-10-22 ENCOUNTER — Other Ambulatory Visit: Payer: Self-pay | Admitting: Student

## 2023-10-22 DIAGNOSIS — Z1231 Encounter for screening mammogram for malignant neoplasm of breast: Secondary | ICD-10-CM

## 2023-10-30 ENCOUNTER — Ambulatory Visit: Payer: Medicaid Other

## 2023-11-04 ENCOUNTER — Ambulatory Visit
Admission: RE | Admit: 2023-11-04 | Discharge: 2023-11-04 | Disposition: A | Discharge status: Home / Self Care | Payer: Medicaid Other | Source: Ambulatory Visit | Attending: Physician Assistant | Admitting: Physician Assistant

## 2023-11-04 DIAGNOSIS — Z1231 Encounter for screening mammogram for malignant neoplasm of breast: Secondary | ICD-10-CM

## 2023-11-12 ENCOUNTER — Ambulatory Visit: Payer: Medicaid Other | Admitting: Family Medicine

## 2023-11-12 NOTE — Progress Notes (Deleted)
    SUBJECTIVE:   CHIEF COMPLAINT / HPI:   ***  TR is a 63yo F w/ hx of prediabetes,    R arm pain  PERTINENT  PMH / PSH: ***  OBJECTIVE:   There were no vitals taken for this visit.  ***  ASSESSMENT/PLAN:   No problem-specific Assessment & Plan notes found for this encounter.     Kylie Brigham, MD [facility]

## 2023-11-26 ENCOUNTER — Ambulatory Visit: Payer: Medicaid Other | Admitting: Student

## 2023-11-26 ENCOUNTER — Encounter: Payer: Self-pay | Admitting: Student

## 2023-11-26 VITALS — BP 120/64 | HR 75 | Ht 65.0 in | Wt 281.8 lb

## 2023-11-26 DIAGNOSIS — M25511 Pain in right shoulder: Secondary | ICD-10-CM | POA: Diagnosis present

## 2023-11-26 DIAGNOSIS — G8929 Other chronic pain: Secondary | ICD-10-CM

## 2023-11-26 DIAGNOSIS — J441 Chronic obstructive pulmonary disease with (acute) exacerbation: Secondary | ICD-10-CM | POA: Diagnosis not present

## 2023-11-26 DIAGNOSIS — J449 Chronic obstructive pulmonary disease, unspecified: Secondary | ICD-10-CM | POA: Diagnosis not present

## 2023-11-26 DIAGNOSIS — F329 Major depressive disorder, single episode, unspecified: Secondary | ICD-10-CM | POA: Diagnosis not present

## 2023-11-26 MED ORDER — BUSPIRONE HCL 10 MG PO TABS: 10.0000 mg | ORAL_TABLET | Freq: Two times a day (BID) | ORAL | 3 refills | Status: DC

## 2023-11-26 MED ORDER — ALBUTEROL SULFATE HFA 108 (90 BASE) MCG/ACT IN AERS: 1.0000 | INHALATION_SPRAY | Freq: Four times a day (QID) | RESPIRATORY_TRACT | 1 refills | Status: AC | PRN

## 2023-11-26 MED ORDER — SERTRALINE HCL 100 MG PO TABS: 100.0000 mg | ORAL_TABLET | Freq: Every day | ORAL | 3 refills | Status: AC

## 2023-11-26 MED ORDER — INCRUSE ELLIPTA 62.5 MCG/ACT IN AEPB: 1.0000 | INHALATION_SPRAY | Freq: Every day | RESPIRATORY_TRACT | 5 refills | Status: DC

## 2023-11-26 NOTE — Patient Instructions (Addendum)
It was great to see you! Thank you for allowing me to participate in your care!  I recommend that you always bring your medications to each appointment as this makes it easy to ensure we are on the correct medications and helps [place] not miss when refills are needed.  Our plans for today:  - Shoulder Pain  This is sounding like arthiritis but we will get some imaging to be sure.   Go to:   [facility]  [address] [place], [place] [address]   Pain Control  Use over the counter lidocaine patches (Salonpas) for 12 hours on, and 12 hours off       Follow up with [facility], If continues to be an issue/worsening  Address: [address] [place], [place], [place] [address] Phone: [phone]   - PAP Smear You are due for a PAP Smear, schedule an appointment as soon as possible.   Take care and seek immediate care sooner if you develop any concerns.   Dr. Bess Kinds, MD [facility]

## 2023-11-26 NOTE — Progress Notes (Signed)
  SUBJECTIVE:   CHIEF COMPLAINT / HPI:   Shoulder pain Goes down shoulder to arm, and back up to her upper back. Comes and goes pending on the weather. Hurts more in the evenings. No hx of trauma or injury. Pain more like numbness and tingling at times, but is sharp and specifically localized today. When hurting she has limited ROM to shoulder level, but otherwise FROM. Has been an issue for 5 weeks.   Due for PAP   PERTINENT  PMH / PSH:    OBJECTIVE:  BP 120/64   Pulse 75   Ht 5\' 5"  (1.651 m)   Wt 281 lb 12.8 oz (127.8 kg)   SpO2 97%   BMI 46.89 kg/m  Physical Exam Musculoskeletal:     Right shoulder: Tenderness present. No swelling, deformity, effusion, laceration, bony tenderness or crepitus. Normal range of motion. Normal strength.     Left shoulder: Normal.     Right upper arm: Normal.      ASSESSMENT/PLAN:   Assessment & Plan Chronic right shoulder pain Patient comes in for shoulder pain, this been an issue for roughly 5 weeks.  Patient appreciates that comes and goes, worse when weather changes, worse in the evenings.  Patient has full range of motion, and normal strength in shoulder, and upper arm, with mild tenderness to palpation about the deltoids.  Patient symptoms concerning for arthritis given waxing and waning, low concern for rotator cuff tear, or frozen shoulder.  Will obtain imaging and recommend topical pain control. - X-ray shoulder - Lidocaine patches for pain control - Patient to follow-up with sports medicine if pain not improving/getting worse  [facility] Due for Pap - Pt told to schedule appointment   No follow-ups on file. Bess Kinds, MD 11/26/2023, 10:25 AM PGY-3, [facility]

## 2023-11-26 NOTE — Assessment & Plan Note (Addendum)
Patient comes in for shoulder pain, this been an issue for roughly 5 weeks.  Patient appreciates that comes and goes, worse when weather changes, worse in the evenings.  Patient has full range of motion, and normal strength in shoulder, and upper arm, with mild tenderness to palpation about the deltoids.  Patient symptoms concerning for arthritis given waxing and waning, low concern for rotator cuff tear, or frozen shoulder.  Will obtain imaging and recommend topical pain control. - X-ray shoulder - Lidocaine patches for pain control - Patient to follow-up with sports medicine if pain not improving/getting worse

## 2024-04-05 ENCOUNTER — Encounter: Payer: Self-pay | Admitting: *Deleted

## 2024-05-09 ENCOUNTER — Ambulatory Visit (INDEPENDENT_AMBULATORY_CARE_PROVIDER_SITE_OTHER)

## 2024-05-09 VITALS — BP 139/77 | HR 82 | Ht 65.0 in | Wt 276.0 lb

## 2024-05-09 DIAGNOSIS — M25511 Pain in right shoulder: Secondary | ICD-10-CM

## 2024-05-09 DIAGNOSIS — G8929 Other chronic pain: Secondary | ICD-10-CM | POA: Diagnosis not present

## 2024-05-09 DIAGNOSIS — J441 Chronic obstructive pulmonary disease with (acute) exacerbation: Secondary | ICD-10-CM | POA: Diagnosis not present

## 2024-05-09 DIAGNOSIS — M25512 Pain in left shoulder: Secondary | ICD-10-CM | POA: Diagnosis not present

## 2024-05-09 DIAGNOSIS — G8911 Acute pain due to trauma: Secondary | ICD-10-CM | POA: Diagnosis not present

## 2024-05-09 DIAGNOSIS — M5442 Lumbago with sciatica, left side: Secondary | ICD-10-CM

## 2024-05-09 MED ORDER — INCRUSE ELLIPTA 62.5 MCG/ACT IN AEPB: 1.0000 | INHALATION_SPRAY | Freq: Every day | RESPIRATORY_TRACT | 5 refills | Status: DC

## 2024-05-09 MED ORDER — LIDOCAINE 5 % EX PTCH: 1.0000 | MEDICATED_PATCH | CUTANEOUS | 0 refills | Status: DC

## 2024-05-09 NOTE — Assessment & Plan Note (Signed)
 PT referral sent in, and work note sent with patient.

## 2024-05-09 NOTE — Progress Notes (Signed)
    SUBJECTIVE:   CHIEF COMPLAINT / HPI:   Left shoulder pain: 2 weeks ago fell on left shoulder on gravel while pushing a lawnmower into a shed.  Left arm dominant, reports fatigue with use, and pain when extended greater than 90 degrees.  She stated that she heard a crack, and feels that her shoulder is popping more easily.  Denies swelling, bruising, freezing, or gross abnormality.  Pain has been well-controlled with 600 mg ibuprofen once a day and ice roller.  Chronic back pain: She has a history of chronic back pain with sciatica, and reports that her symptoms are severe when she is standing for long periods of time at work and is requesting a work note.  Symptoms are relieved when she is sitting, and have not been worsening in frequency or intensity.  Weight: She reports trying several diets in the past, with little success.  Due to her MSK symptoms she also has difficulty maintaining an active lifestyle.  Was interested in GLP-1.  Health maintenance: Scheduled Pap at the end of visit.  PERTINENT  PMH / PSH: Reviewed  OBJECTIVE:   BP 139/77   Pulse 82   Ht 5' 5 (1.651 m)   Wt 276 lb (125.2 kg)   SpO2 95%   BMI 45.93 kg/m    MSK: Left shoulder negative Hawkins and empty can test.  No bony abnormalities, or tenderness of AC joint.  4 out of 5 strength left shoulder with extension, 5 out of 5 grip strength.  FROM passively, limited extension actively bilaterally.  Right shoulder within normal limits Cardiac: Regular rate and rhythm. Normal S1/S2. No murmurs, rubs, or gallops appreciated. Lungs: Mild expiratory wheezing bilateral upper lobes.  Otherwise clear to auscultation bilaterally, good air movement.  Psych: Pleasant and appropriate    ASSESSMENT/PLAN:   Assessment & Plan Acute pain of left shoulder due to trauma No bony abnormalities, or tenderness with palpation, suspicion for fracture or acute ligament injury.  Will hold off on imaging, but encouraged to follow-up with  sports med clinic if pain persists for the next 2 weeks. - Continue as needed ibuprofen - Added as needed lidocaine  patch Chronic midline low back pain with left-sided sciatica PT referral sent in, and work note sent with patient. Chronic right shoulder pain Same plan as above COPD exacerbation (HCC) Refilled Incruse Ellipta      Fairy Amy, MD [facility]

## 2024-05-09 NOTE — Assessment & Plan Note (Signed)
Same plan as above.  

## 2024-05-09 NOTE — Assessment & Plan Note (Signed)
Refilled Incruse Ellipta

## 2024-05-09 NOTE — Patient Instructions (Addendum)
 Thank you for visiting the clinic today, it was good to see you! In today's visit we discussed:  Left shoulder pain: Continue to ice and use ibuprofen as needed.  I have also prescribed a lidocaine  patch for any breakthrough pain.  If the pain does not improve over the next 2 weeks I would encourage you to follow-up with sports medicine.  Work note: I have put in a work note for you, but I would encourage you to try to continue standing is much as tolerated.  This will help strengthen your back, prolonged sitting could worsen your symptoms.  I have put in a physical therapy referral though, which could help strengthen the back and improve symptoms as you were throughout the day.  Weight loss: I have attached handouts for the Mediterranean diet as well as nonsurgical interventions for weight loss.  Please follow-up in 2-4 weeks with sports medicine if pain does not improve  For any questions, please call the office at [phone] or send me a message in MyChart. Have a great day!  -Fairy Amy, MD  [facility] Resident, PGY-1

## 2024-05-11 ENCOUNTER — Telehealth: Payer: Self-pay

## 2024-05-11 NOTE — Telephone Encounter (Signed)
 Pharmacy Patient Advocate Encounter  Received notification from [facility] MEDICAID that Prior Authorization for lidocaine  5% patches has been DENIED.  Full denial letter will be uploaded to the media tab. See denial reason below.     LIDOCAINE  4% PATCHES AVAILABLE OTC  PA #/Case ID/Reference #: [id number]

## 2024-05-11 NOTE — Telephone Encounter (Signed)
 Pharmacy Patient Advocate Encounter   Received notification from CoverMyMeds that prior authorization for LIDOCAINE  5% PATCHES is required/requested.   Insurance verification completed.   The patient is insured through [facility] MEDICAID .   PA required; PA submitted to above mentioned insurance via CoverMyMeds Key/confirmation #/EOC [id number]. Status is pending

## 2024-05-13 ENCOUNTER — Encounter: Payer: Self-pay | Admitting: Advanced Practice Midwife

## 2024-05-18 ENCOUNTER — Ambulatory Visit (INDEPENDENT_AMBULATORY_CARE_PROVIDER_SITE_OTHER): Admitting: Student

## 2024-05-18 ENCOUNTER — Other Ambulatory Visit (HOSPITAL_COMMUNITY)
Admission: RE | Admit: 2024-05-18 | Discharge: 2024-05-18 | Disposition: A | Discharge status: Home / Self Care | Source: Ambulatory Visit | Attending: Family Medicine | Admitting: Family Medicine

## 2024-05-18 ENCOUNTER — Encounter: Payer: Self-pay | Admitting: Student

## 2024-05-18 VITALS — BP 132/62 | HR 71 | Ht 65.0 in | Wt 277.8 lb

## 2024-05-18 DIAGNOSIS — Z01419 Encounter for gynecological examination (general) (routine) without abnormal findings: Secondary | ICD-10-CM

## 2024-05-18 NOTE — Progress Notes (Signed)
  SUBJECTIVE:   CHIEF COMPLAINT / HPI:   Pap Smear -Last Pap 06/16/16 NILM  Today: Patient comes in for PAP, notes no vaginal discharge or burning, but notes some itching whenever she goes walking, related to sweating in her vulva. Patient noted that itching subsided when sweating goes away.   [place] Application Patient also drops off [place] application, and notes she can't walk long distances or stand for long periods related to her knee pain.  PERTINENT  PMH / PSH:   OBJECTIVE:  BP 132/62   Pulse 71   Ht 5' 5 (1.651 m)   Wt 277 lb 12.8 oz (126 kg)   SpO2 97%   BMI 46.23 kg/m  Physical Exam Exam conducted with a chaperone present.  Genitourinary:    General: Normal vulva.     Pubic Area: No rash.      Labia:        Right: No rash, tenderness, lesion or injury.        Left: No rash, tenderness, lesion or injury.      Vagina: Normal. No signs of injury and foreign body. No vaginal discharge, erythema, tenderness, bleeding or lesions.     Cervix: Normal.      ASSESSMENT/PLAN:   Assessment & Plan Pap smear, as part of routine gynecological examination Patient comes in for routine Pap smear, with no complaints.  Patient does appreciate some vaginal itching with swelling, whenever she is walking.  Have suggested patient wear cotton underwear to help with moisture reduction.  Patient had Pap performed and tolerated procedure well. - Pap smear - Cotton underwear for itching/sweating No follow-ups on file. Penne Rhein, MD 05/18/2024, 8:30 AM PGY-3, [facility]

## 2024-05-18 NOTE — Patient Instructions (Signed)
 It was great to see you! Thank you for allowing me to participate in your care!  I recommend that you always bring your medications to each appointment as this makes it easy to ensure we are on the correct medications and helps us  not miss when refills are needed.  Our plans for today:  - Pap Smear  Today we performed a Pap Smear, you will be contacted with the results  - [place] application  I will complete this and have it ready for pick up tomorrow  - Vaginal Itching It seems like your itching is related to your sweating. If you wear cotton underwear, it will help with the moisture and reduce itching.   Take care and seek immediate care sooner if you develop any concerns.   Dr. Penne Rhein, MD [facility]

## 2024-05-20 LAB — CYTOLOGY - PAP
Adequacy: ABSENT
Comment: NEGATIVE
Diagnosis: NEGATIVE
High risk HPV: NEGATIVE

## 2024-05-21 ENCOUNTER — Ambulatory Visit: Payer: Self-pay | Admitting: Family Medicine

## 2024-05-23 ENCOUNTER — Other Ambulatory Visit: Payer: Self-pay

## 2024-05-23 ENCOUNTER — Other Ambulatory Visit (HOSPITAL_BASED_OUTPATIENT_CLINIC_OR_DEPARTMENT_OTHER): Payer: Self-pay

## 2024-05-23 DIAGNOSIS — G8911 Acute pain due to trauma: Secondary | ICD-10-CM

## 2024-05-23 MED ORDER — LIDOCAINE 5 % EX PTCH
1.0000 | MEDICATED_PATCH | CUTANEOUS | 0 refills | Status: AC
  Filled 2024-05-23: qty 14, 14d supply, fill #0

## 2024-05-30 ENCOUNTER — Other Ambulatory Visit: Payer: Self-pay | Admitting: Student

## 2024-05-30 DIAGNOSIS — F329 Major depressive disorder, single episode, unspecified: Secondary | ICD-10-CM

## 2024-06-21 ENCOUNTER — Other Ambulatory Visit: Payer: Self-pay

## 2024-06-21 ENCOUNTER — Ambulatory Visit: Attending: Family Medicine | Admitting: Physical Therapy

## 2024-06-21 DIAGNOSIS — G8911 Acute pain due to trauma: Secondary | ICD-10-CM | POA: Diagnosis not present

## 2024-06-21 DIAGNOSIS — G8929 Other chronic pain: Secondary | ICD-10-CM | POA: Diagnosis not present

## 2024-06-21 DIAGNOSIS — M25511 Pain in right shoulder: Secondary | ICD-10-CM | POA: Diagnosis not present

## 2024-06-21 DIAGNOSIS — M25612 Stiffness of left shoulder, not elsewhere classified: Secondary | ICD-10-CM | POA: Diagnosis present

## 2024-06-21 DIAGNOSIS — R293 Abnormal posture: Secondary | ICD-10-CM | POA: Insufficient documentation

## 2024-06-21 DIAGNOSIS — M6281 Muscle weakness (generalized): Secondary | ICD-10-CM | POA: Diagnosis present

## 2024-06-21 DIAGNOSIS — M5442 Lumbago with sciatica, left side: Secondary | ICD-10-CM | POA: Insufficient documentation

## 2024-06-21 DIAGNOSIS — M25512 Pain in left shoulder: Secondary | ICD-10-CM | POA: Diagnosis present

## 2024-06-21 NOTE — Therapy (Signed)
 OUTPATIENT PHYSICAL THERAPY SHOULDER EVALUATION   Patient Name: Kylie Garner MRN: [id number] DOB:01-16-1961, 63 y.o., female Today's Date: 06/21/2024  END OF SESSION:  PT End of Session - 06/21/24 0949     Visit Number 1    Number of Visits 16    Date for PT Re-Evaluation 08/16/24    Authorization Type [facility] Medicaid    Authorization Time Period 27 visit limit no auth required    PT Start Time 0945   late arrival   PT Stop Time 1015    PT Time Calculation (min) 30 min    Activity Tolerance Patient tolerated treatment well          Past Medical History:  Diagnosis Date   Anxiety    Bilateral leg edema 09/07/2017   COPD (chronic obstructive pulmonary disease) (HCC)    Depression    Encounter for screening mammogram for malignant neoplasm of breast 03/14/2020   Headache 04/19/2018   Hypertension    Past Surgical History:  Procedure Laterality Date   APPENDECTOMY     CESAREAN SECTION  10/28/1991   KNEE CARTILAGE SURGERY Left    Patient Active Problem List   Diagnosis Date Noted   Chronic right shoulder pain 11/26/2023   Dizziness 06/02/2023   Shaking 06/02/2023   OSA (obstructive sleep apnea) 06/04/2022   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   Prediabetes 07/20/2018   BPPV (benign paroxysmal positional vertigo) 02/17/2018   Pain in right knee 09/15/2016   COPD exacerbation (HCC) 11/06/2014   Depression 11/06/2014   Morbid obesity (HCC) 11/06/2014   Tobacco abuse 11/06/2014    PCP: Lupie Credit, DO  REFERRING PROVIDER: Rosalynn Credit LITTIE, MD  REFERRING DIAG: [phone] (ICD-10-CM) - Acute pain of left shoulder due to trauma M54.42,G89.29 (ICD-10-CM) - Chronic midline low back pain with left-sided sciatica M25.511,G89.29 (ICD-10-CM) - Chronic right shoulder pain  THERAPY DIAG:  Muscle weakness (generalized)  Acute pain of left shoulder  Stiffness of left shoulder, not elsewhere classified  Abnormal posture  Rationale for  Evaluation and Treatment: Rehabilitation  ONSET DATE: ~1 month  SUBJECTIVE:                                                                                                                                                                                      SUBJECTIVE STATEMENT: Pt arrives late to evaluation. Would like to focus assessment on her shoulder. Pt was helping to put the lawnmower up a ramp into a shed and slipped and fell and landed on her L shoulder. Pt was aching for 2 weeks but it never got better. Pt states her arm now tingles and  goes up to her neck. Unable to sleep on L side. Reports increased L side neck stiffness Hand dominance: Left  PERTINENT HISTORY: Per office note:   2 weeks ago fell on left shoulder on gravel while pushing a lawnmower into a shed.  Left arm dominant, reports fatigue with use, and pain when extended greater than 90 degrees.  She stated that she heard a crack, and feels that her shoulder is popping more easily.SABRASABRAShe has a history of chronic back pain with sciatica, and reports that her symptoms are severe when she is standing for long periods of time at work and is requesting a work note. Symptoms are relieved when she is sitting, and have not been worsening in frequency or intensity.  PAIN:  Are you having pain? Yes: NPRS scale: 0 at rest, 5 at worst  Pain location: L side of arm/top from shoulder to forearm Pain description: dull and aching Aggravating factors: holding/lifting arm up Relieving factors: icy hot  PRECAUTIONS: None  RED FLAGS: None   WEIGHT BEARING RESTRICTIONS: No  FALLS:  Has patient fallen in last 6 months? Yes. Number of falls 1  LIVING ENVIRONMENT: Lives with: nephew Lives in: House/apartment  OCCUPATION: On disability  PLOF: Independent  PATIENT GOALS: Improve shoulder pain with daily tasks  NEXT MD VISIT:   OBJECTIVE:  Note: Objective measures were completed at Evaluation unless otherwise  noted.  DIAGNOSTIC FINDINGS:  Shoulder x-ray ordered but not yet completed  PATIENT SURVEYS:  Quick Dash:  QUICK DASH  Please rate your ability do the following activities in the last week by selecting the number below the appropriate response.   Activities Rating  Open a tight or new jar.  5 = Unable  Do heavy household chores (e.g., wash walls, floors). 5 = Unable  Carry a shopping bag or briefcase 4 = Severe difficulty  Wash your back. 5 = Unable  Use a knife to cut food. 4 = Severe difficulty  Recreational activities in which you take some force or impact through your arm, shoulder or hand (e.g., golf, hammering, tennis, etc.). 5 = Unable  During the past week, to what extent has your arm, shoulder or hand problem interfered with your normal social activities with family, friends, neighbors or groups?  4 = Quite a bit  During the past week, were you limited in your work or other regular daily activities as a result of your arm, shoulder or hand problem? 5 = Unable  Rate the severity of the following symptoms in the last week: Arm, Shoulder, or hand pain. 4 = Severe  Rate the severity of the following symptoms in the last week: Tingling (pins and needles) in your arm, shoulder or hand. 5 = Extreme  During the past week, how much difficulty have you had sleeping because of the pain in your arm, shoulder or hand?  5 = So much difficulty that I can't sleep  Score: 90.9   (A QuickDASH score may not be calculated if there is greater than 1 missing item.)  Minimally Clinically Important Difference (MCID): 15-20 points  (Franchignoni, F. et al. (2013). Minimally clinically important difference of the disabilities of the arm, shoulder, and hand outcome measures (DASH) and its shortened version (Quick DASH). Journal of Orthopaedic & Sports Physical Therapy, 44(1), 30-39)   COGNITION: Overall cognitive status: Within functional limits for tasks assessed     SENSATION: At rest no N/T but  can spread from the top of her shoulder down her arm  POSTURE: Forward head, rounded shoulders  CERVICAL ROM:   Active ROM A/PROM (deg) eval  Flexion 20  Extension 30  Right lateral flexion 23  Left lateral flexion 25  Right rotation 41 pulls on left and painful  Left rotation 55   (Blank rows = not tested)   UPPER EXTREMITY ROM:   Active ROM Right eval Left eval  Shoulder flexion 157 155 but painful  Shoulder extension 50 50 but pain  Shoulder abduction 125 102 pain  Shoulder adduction    Shoulder internal rotation R sacrum L posterior iliac crest  Shoulder external rotation 30 30  Elbow flexion    Elbow extension    Wrist flexion    Wrist extension    Wrist ulnar deviation    Wrist radial deviation    Wrist pronation    Wrist supination    (Blank rows = not tested)  UPPER EXTREMITY MMT:  MMT Right eval Left eval  Shoulder flexion 5 4- pain  Shoulder extension 5 5  Shoulder abduction 5 4- pain  Shoulder adduction    Shoulder internal rotation 5 5  Shoulder external rotation 5 3+  Middle trapezius    Lower trapezius    Elbow flexion    Elbow extension    Wrist flexion    Wrist extension    Wrist ulnar deviation    Wrist radial deviation    Wrist pronation    Wrist supination    Grip strength (lbs)    (Blank rows = not tested)  SHOULDER SPECIAL TESTS: Impingement tests: Hawkins/Kennedy impingement test: positive  and Painful arc test: positive  SLAP lesions: Biceps load test: negative Instability tests: Apprehension test: positive  Rotator cuff assessment: Full can test: positive  and Belly press test: negative  JOINT MOBILITY TESTING:  No overt joint hypomobility  PALPATION:  TTP L infraspinatus, upper trap, levator scap, cervical paraspinals                                                                                                                             TREATMENT DATE: 06/21/24 See HEP below   PATIENT EDUCATION: Education  details: Exam findings, POC, initial HEP Person educated: Patient Education method: Explanation, Demonstration, and Handouts Education comprehension: verbalized understanding  HOME EXERCISE PROGRAM: Access Code: [email or web address] URL: [email or web address] Date: 06/21/2024 Prepared by: Rashan Patient April Marie Kolton Kienle  Exercises - Gentle Levator Scapulae Stretch  - 2-3 x daily - 7 x weekly - 30 sec hold - Seated Upper Trapezius Stretch  - 2-3 x daily - 7 x weekly - 30 sec hold - Shoulder External Rotation and Scapular Retraction  - 1 x daily - 7 x weekly - 2-3 sets - 10 reps - Standing Shoulder Internal Rotation AAROM with Dowel  - 1 x daily - 7 x weekly - 2-3 sets - 10 reps  ASSESSMENT:  CLINICAL IMPRESSION: Patient is a 63 y.o. F who was seen today for physical therapy evaluation and treatment  for L shoulder pain s/p fall ~1 month ago. Pt is L hand dominant. Assessment is significant for concurrent L side neck pain with muscular tightness/trigger points and decreased cervical ROM, postural abnormalities, L>R shoulder weakness, pain, and decreased ROM. Pt will benefit from PT to address these issues for return to PLOF.  OBJECTIVE IMPAIRMENTS: decreased activity tolerance, decreased coordination, decreased mobility, decreased ROM, decreased strength, increased fascial restrictions, increased muscle spasms, impaired UE functional use, improper body mechanics, postural dysfunction, and pain.   ACTIVITY LIMITATIONS: carrying, lifting, sleeping, bathing, toileting, dressing, reach over head, and hygiene/grooming  PARTICIPATION LIMITATIONS: meal prep, cleaning, laundry, shopping, and community activity  PERSONAL FACTORS: Age, Fitness, Past/current experiences, and Time since onset of injury/illness/exacerbation are also affecting patient's functional outcome.   REHAB POTENTIAL: Good  CLINICAL DECISION MAKING: Evolving/moderate complexity  EVALUATION COMPLEXITY: Moderate   GOALS: Goals  reviewed with patient? Yes  SHORT TERM GOALS: Target date: 07/19/2024   Pt will be ind with initial HEP Baseline: Goal status: INITIAL  2.  Pt will demo full cervical ROM without pain Baseline:  Goal status: INITIAL   LONG TERM GOALS: Target date: 08/16/2024   Pt will be ind with management and progression of HEP Baseline:  Goal status: INITIAL  2.  Pt will demo full shoulder ROM without pain Baseline:  Goal status: INITIAL  3.  Pt will have improved QuickDASH to </=75 points to demo MCID Baseline:  Goal status: INITIAL  4.  Pt will report improved overall symptoms by >/=80% Baseline:  Goal status: INITIAL   PLAN:  PT FREQUENCY: 1-2x/week  PT DURATION: 8 weeks  PLANNED INTERVENTIONS: 97164- PT Re-evaluation, 97750- Physical Performance Testing, 97110-Therapeutic exercises, 97530- Therapeutic activity, [id number]- Neuromuscular re-education, 97535- Self Care, 02859- Manual therapy, G0283- Electrical stimulation (unattended), 97016- Vasopneumatic device, [id number]- Ultrasound, [id number]- Ionotophoresis 4mg /ml Dexamethasone, 79439 (1-2 muscles), 20561 (3+ muscles)- Dry Needling, Patient/Family education, Taping, Joint mobilization, Spinal mobilization, Cryotherapy, and Moist heat  PLAN FOR NEXT SESSION: Assess response to HEP. Manual as needed to address trigger points. Gentle shoulder AAROM. Initiate isometrics/strengthening.    Rudene Poulsen April Ma L Siraj Dermody, PT, DPT 06/21/2024, 1:02 PM

## 2024-07-06 ENCOUNTER — Telehealth: Payer: Self-pay

## 2024-07-06 ENCOUNTER — Ambulatory Visit: Attending: Family Medicine

## 2024-07-06 NOTE — Telephone Encounter (Signed)
 LVM regarding missed appointment. Reminded of next appointment time and clinic attendance policy.  1st no-show  Kylie Garner, [name] 07/06/24 11:04 AM

## 2024-07-06 NOTE — Therapy (Incomplete)
 OUTPATIENT PHYSICAL THERAPY TREATMENT NOTE    Patient Name: Kylie Garner MRN: [id number] DOB:[birth year], 62 y.o., female Today's Date: 07/06/2024  END OF SESSION:    Past Medical History:  Diagnosis Date   Anxiety    Bilateral leg edema 09/07/2017   COPD (chronic obstructive pulmonary disease) (HCC)    Depression    Encounter for screening mammogram for malignant neoplasm of breast 03/14/2020   Headache 04/19/2018   Hypertension    Past Surgical History:  Procedure Laterality Date   APPENDECTOMY     CESAREAN SECTION  10/28/1991   KNEE CARTILAGE SURGERY Left    Patient Active Problem List   Diagnosis Date Noted   Chronic right shoulder pain 11/26/2023   Dizziness 06/02/2023   Shaking 06/02/2023   OSA (obstructive sleep apnea) 06/04/2022   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  Rationale for Evaluation and Treatment: Rehabilitation  ONSET DATE: ~1 month  SUBJECTIVE:                                                                                                                                                                                      SUBJECTIVE STATEMENT: ***  Pt arrives late to evaluation. Would like to focus assessment on her shoulder. Pt was helping to put the lawnmower up a ramp into a shed and slipped and fell and landed on her L shoulder. Pt was aching for 2  weeks but it never got better. Pt states her arm now tingles and goes up to her neck. Unable to sleep on L side. Reports increased L side neck stiffness Hand dominance: Left  PERTINENT HISTORY: Per office note:   2 weeks ago fell on left shoulder on gravel while pushing a lawnmower into a shed.  Left arm dominant, reports fatigue with use, and pain when extended greater than 90 degrees.  She stated that she heard a crack, and feels that her shoulder is popping more easily.SABRASABRAShe has a history of chronic back pain with sciatica, and reports that her symptoms are severe when she is standing for long periods of time at work  and is requesting a work note. Symptoms are relieved when she is sitting, and have not been worsening in frequency or intensity.  PAIN:  Are you having pain? Yes: NPRS scale: 0 at rest, 5 at worst  Pain location: L side of arm/top from shoulder to forearm Pain description: dull and aching Aggravating factors: holding/lifting arm up Relieving factors: icy hot  PRECAUTIONS: None  RED FLAGS: None   WEIGHT BEARING RESTRICTIONS: No  FALLS:  Has patient fallen in last 6 months? Yes. Number of falls 1  LIVING ENVIRONMENT: Lives with: nephew Lives in: House/apartment  OCCUPATION: On disability  PLOF: Independent  PATIENT GOALS: Improve shoulder pain with daily tasks  NEXT MD VISIT:   OBJECTIVE:  Note: Objective measures were completed at Evaluation unless otherwise noted.  DIAGNOSTIC FINDINGS:  Shoulder x-ray ordered but not yet completed  PATIENT SURVEYS:  Quick Dash:  QUICK DASH  Please rate your ability do the following activities in the last week by selecting the number below the appropriate response.   Activities Rating  Open a tight or new jar.  5 = Unable  Do heavy household chores (e.g., wash walls, floors). 5 = Unable  Carry a shopping bag or briefcase 4 = Severe difficulty  Wash your back. 5 = Unable  Use a knife to cut food. 4 = Severe  difficulty  Recreational activities in which you take some force or impact through your arm, shoulder or hand (e.g., golf, hammering, tennis, etc.). 5 = Unable  During the past week, to what extent has your arm, shoulder or hand problem interfered with your normal social activities with family, friends, neighbors or groups?  4 = Quite a bit  During the past week, were you limited in your work or other regular daily activities as a result of your arm, shoulder or hand problem? 5 = Unable  Rate the severity of the following symptoms in the last week: Arm, Shoulder, or hand pain. 4 = Severe  Rate the severity of the following symptoms in the last week: Tingling (pins and needles) in your arm, shoulder or hand. 5 = Extreme  During the past week, how much difficulty have you had sleeping because of the pain in your arm, shoulder or hand?  5 = So much difficulty that I can't sleep  Score: 90.9   (A QuickDASH score may not be calculated if there is greater than 1 missing item.)  Minimally Clinically Important Difference (MCID): 15-20 points  (Franchignoni, F. et al. (2013). Minimally clinically important difference of the disabilities of the arm, shoulder, and hand outcome measures (DASH) and its shortened version (Quick DASH). Journal of Orthopaedic & Sports Physical Therapy, 44(1), 30-39)   COGNITION: Overall cognitive status: Within functional limits for tasks assessed     SENSATION: At rest no N/T but can spread from the top of her shoulder down her arm  POSTURE: Forward head, rounded shoulders  CERVICAL ROM:   Active ROM A/PROM (deg) eval  Flexion 20  Extension 30  Right lateral flexion 23  Left lateral flexion 25  Right rotation 41 pulls on left and painful  Left rotation 55   (Blank rows = not tested)   UPPER EXTREMITY ROM:   Active ROM Right eval Left eval  Shoulder flexion 157 155 but painful  Shoulder extension 50 50 but pain  Shoulder abduction 125 102 pain  Shoulder  adduction    Shoulder internal rotation R sacrum L posterior iliac crest  Shoulder external rotation 30  30  Elbow flexion    Elbow extension    Wrist flexion    Wrist extension    Wrist ulnar deviation    Wrist radial deviation    Wrist pronation    Wrist supination    (Blank rows = not tested)  UPPER EXTREMITY MMT:  MMT Right eval Left eval  Shoulder flexion 5 4- pain  Shoulder extension 5 5  Shoulder abduction 5 4- pain  Shoulder adduction    Shoulder internal rotation 5 5  Shoulder external rotation 5 3+  Middle trapezius    Lower trapezius    Elbow flexion    Elbow extension    Wrist flexion    Wrist extension    Wrist ulnar deviation    Wrist radial deviation    Wrist pronation    Wrist supination    Grip strength (lbs)    (Blank rows = not tested)  SHOULDER SPECIAL TESTS: Impingement tests: Hawkins/Kennedy impingement test: positive  and Painful arc test: positive  SLAP lesions: Biceps load test: negative Instability tests: Apprehension test: positive  Rotator cuff assessment: Full can test: positive  and Belly press test: negative  JOINT MOBILITY TESTING:  No overt joint hypomobility  PALPATION:  TTP L infraspinatus, upper trap, levator scap, cervical paraspinals                                                                                                                             TREATMENT DATE:  [facility] Adult PT Treatment:                                                DATE: 07/06/24 Therapeutic Exercise: Supine chest press AAROM with dowel 2x10 Supine shoulder flexion AAROM with dowel 2x10 Supine ER AAROM with dowel 2x10 Supine shoulder flexion AAROM with hand clasped Neuromuscular re-ed: Seated scap retraction elbows clasped 2 hold 2x10 LUE shoulder isometrics ER, IR, abd, add, flex, ext 5 hold x 10 ea Seated BIL shoulder ER with scap retraction (no resistance) 2x10   06/21/24 See HEP below   PATIENT EDUCATION: Education details: Exam  findings, POC, initial HEP Person educated: Patient Education method: Explanation, Demonstration, and Handouts Education comprehension: verbalized understanding  HOME EXERCISE PROGRAM: Access Code: [email or web address] URL: [email or web address] Date: 06/21/2024 Prepared by: Gellen April Marie Nonato  Exercises - Gentle Levator Scapulae Stretch  - 2-3 x daily - 7 x weekly - 30 sec hold - Seated Upper Trapezius Stretch  - 2-3 x daily - 7 x weekly - 30 sec hold - Shoulder External Rotation and Scapular Retraction  - 1 x daily - 7 x weekly - 2-3 sets - 10 reps - Standing Shoulder Internal Rotation AAROM with Dowel  - 1 x daily - 7 x weekly - 2-3 sets - 10 reps  ASSESSMENT:  CLINICAL IMPRESSION: ***  EVAL: Patient is a 63 y.o. F who was seen today for physical therapy evaluation and treatment for L shoulder pain s/p fall ~1 month ago. Pt is L hand dominant. Assessment is significant for concurrent L side neck pain with muscular tightness/trigger points and decreased cervical ROM, postural abnormalities, L>R shoulder weakness, pain, and decreased ROM. Pt will benefit from PT to address these issues for return to PLOF.  OBJECTIVE IMPAIRMENTS: decreased activity tolerance, decreased coordination, decreased mobility, decreased ROM, decreased strength, increased fascial restrictions, increased muscle spasms, impaired UE functional use, improper body mechanics, postural dysfunction, and pain.   ACTIVITY LIMITATIONS: carrying, lifting, sleeping, bathing, toileting, dressing, reach over head, and hygiene/grooming  PARTICIPATION LIMITATIONS: meal prep, cleaning, laundry, shopping, and community activity  PERSONAL FACTORS: Age, Fitness, Past/current experiences, and Time since onset of injury/illness/exacerbation are also affecting patient's functional outcome.   REHAB POTENTIAL: Good  CLINICAL DECISION MAKING: Evolving/moderate complexity  EVALUATION COMPLEXITY: Moderate   GOALS: Goals  reviewed with patient? Yes  SHORT TERM GOALS: Target date: 07/19/2024   Pt will be ind with initial HEP Baseline: Goal status: INITIAL  2.  Pt will demo full cervical ROM without pain Baseline:  Goal status: INITIAL   LONG TERM GOALS: Target date: 08/16/2024   Pt will be ind with management and progression of HEP Baseline:  Goal status: INITIAL  2.  Pt will demo full shoulder ROM without pain Baseline:  Goal status: INITIAL  3.  Pt will have improved QuickDASH to </=75 points to demo MCID Baseline:  Goal status: INITIAL  4.  Pt will report improved overall symptoms by >/=80% Baseline:  Goal status: INITIAL   PLAN:  PT FREQUENCY: 1-2x/week  PT DURATION: 8 weeks  PLANNED INTERVENTIONS: 97164- PT Re-evaluation, 97750- Physical Performance Testing, 97110-Therapeutic exercises, 97530- Therapeutic activity, [id number]- Neuromuscular re-education, 97535- Self Care, 02859- Manual therapy, G0283- Electrical stimulation (unattended), 97016- Vasopneumatic device, [id number]- Ultrasound, [id number]- Ionotophoresis 4mg /ml Dexamethasone, 79439 (1-2 muscles), 20561 (3+ muscles)- Dry Needling, Patient/Family education, Taping, Joint mobilization, Spinal mobilization, Cryotherapy, and Moist heat  PLAN FOR NEXT SESSION: Assess response to HEP. Manual as needed to address trigger points. Gentle shoulder AAROM. Initiate isometrics/strengthening.    Corean Pouch, PTA 07/06/2024, 8:03 AM

## 2024-07-08 ENCOUNTER — Ambulatory Visit

## 2024-07-08 ENCOUNTER — Telehealth: Payer: Self-pay

## 2024-07-08 NOTE — Telephone Encounter (Signed)
 LVM regarding missed appointment. 2nd no-show, cancelling remaining appointments and advised patient to call clinic to reschedule. Can only schedule 1 appointment at a time.   Corean Pouch, PTA 07/08/24 10:29 AM

## 2024-07-08 NOTE — Therapy (Incomplete)
 OUTPATIENT PHYSICAL THERAPY TREATMENT NOTE    Patient Name: Kylie Garner MRN: [id number] DOB:01-05-1961, 63 y.o., female Today's Date: 07/08/2024  END OF SESSION:    Past Medical History:  Diagnosis Date   Anxiety    Bilateral leg edema 09/07/2017   COPD (chronic obstructive pulmonary disease) (HCC)    Depression    Encounter for screening mammogram for malignant neoplasm of breast 03/14/2020   Headache 04/19/2018   Hypertension    Past Surgical History:  Procedure Laterality Date   APPENDECTOMY     CESAREAN SECTION  10/28/1991   KNEE CARTILAGE SURGERY Left    Patient Active Problem List   Diagnosis Date Noted   Chronic right shoulder pain 11/26/2023   Dizziness 06/02/2023   Shaking 06/02/2023   OSA (obstructive sleep apnea) 06/04/2022   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  Rationale for Evaluation and Treatment: Rehabilitation  ONSET DATE: ~1 month  SUBJECTIVE:                                                                                                                                                                                      SUBJECTIVE STATEMENT: ***  Pt arrives late to evaluation. Would like to focus assessment on her shoulder. Pt was helping to put the lawnmower up a ramp into a shed and slipped and fell and landed on her L shoulder. Pt was aching for 2  weeks but it never got better. Pt states her arm now tingles and goes up to her neck. Unable to sleep on L side. Reports increased L side neck stiffness Hand dominance: Left  PERTINENT HISTORY: Per office note:   2 weeks ago fell on left shoulder on gravel while pushing a lawnmower into a shed.  Left arm dominant, reports fatigue with use, and pain when extended greater than 90 degrees.  She stated that she heard a crack, and feels that her shoulder is popping more easily.SABRASABRAShe has a history of chronic back pain with sciatica, and reports that her symptoms are severe when she is standing for long periods of time at work  and is requesting a work note. Symptoms are relieved when she is sitting, and have not been worsening in frequency or intensity.  PAIN:  Are you having pain? Yes: NPRS scale: 0 at rest, 5 at worst  Pain location: L side of arm/top from shoulder to forearm Pain description: dull and aching Aggravating factors: holding/lifting arm up Relieving factors: icy hot  PRECAUTIONS: None  RED FLAGS: None   WEIGHT BEARING RESTRICTIONS: No  FALLS:  Has patient fallen in last 6 months? Yes. Number of falls 1  LIVING ENVIRONMENT: Lives with: nephew Lives in: House/apartment  OCCUPATION: On disability  PLOF: Independent  PATIENT GOALS: Improve shoulder pain with daily tasks  NEXT MD VISIT:   OBJECTIVE:  Note: Objective measures were completed at Evaluation unless otherwise noted.  DIAGNOSTIC FINDINGS:  Shoulder x-ray ordered but not yet completed  PATIENT SURVEYS:  Quick Dash:  QUICK DASH  Please rate your ability do the following activities in the last week by selecting the number below the appropriate response.   Activities Rating  Open a tight or new jar.  5 = Unable  Do heavy household chores (e.g., wash walls, floors). 5 = Unable  Carry a shopping bag or briefcase 4 = Severe difficulty  Wash your back. 5 = Unable  Use a knife to cut food. 4 = Severe  difficulty  Recreational activities in which you take some force or impact through your arm, shoulder or hand (e.g., golf, hammering, tennis, etc.). 5 = Unable  During the past week, to what extent has your arm, shoulder or hand problem interfered with your normal social activities with family, friends, neighbors or groups?  4 = Quite a bit  During the past week, were you limited in your work or other regular daily activities as a result of your arm, shoulder or hand problem? 5 = Unable  Rate the severity of the following symptoms in the last week: Arm, Shoulder, or hand pain. 4 = Severe  Rate the severity of the following symptoms in the last week: Tingling (pins and needles) in your arm, shoulder or hand. 5 = Extreme  During the past week, how much difficulty have you had sleeping because of the pain in your arm, shoulder or hand?  5 = So much difficulty that I can't sleep  Score: 90.9   (A QuickDASH score may not be calculated if there is greater than 1 missing item.)  Minimally Clinically Important Difference (MCID): 15-20 points  (Franchignoni, F. et al. (2013). Minimally clinically important difference of the disabilities of the arm, shoulder, and hand outcome measures (DASH) and its shortened version (Quick DASH). Journal of Orthopaedic & Sports Physical Therapy, 44(1), 30-39)   COGNITION: Overall cognitive status: Within functional limits for tasks assessed     SENSATION: At rest no N/T but can spread from the top of her shoulder down her arm  POSTURE: Forward head, rounded shoulders  CERVICAL ROM:   Active ROM A/PROM (deg) eval  Flexion 20  Extension 30  Right lateral flexion 23  Left lateral flexion 25  Right rotation 41 pulls on left and painful  Left rotation 55   (Blank rows = not tested)   UPPER EXTREMITY ROM:   Active ROM Right eval Left eval  Shoulder flexion 157 155 but painful  Shoulder extension 50 50 but pain  Shoulder abduction 125 102 pain  Shoulder  adduction    Shoulder internal rotation R sacrum L posterior iliac crest  Shoulder external rotation 30  30  Elbow flexion    Elbow extension    Wrist flexion    Wrist extension    Wrist ulnar deviation    Wrist radial deviation    Wrist pronation    Wrist supination    (Blank rows = not tested)  UPPER EXTREMITY MMT:  MMT Right eval Left eval  Shoulder flexion 5 4- pain  Shoulder extension 5 5  Shoulder abduction 5 4- pain  Shoulder adduction    Shoulder internal rotation 5 5  Shoulder external rotation 5 3+  Middle trapezius    Lower trapezius    Elbow flexion    Elbow extension    Wrist flexion    Wrist extension    Wrist ulnar deviation    Wrist radial deviation    Wrist pronation    Wrist supination    Grip strength (lbs)    (Blank rows = not tested)  SHOULDER SPECIAL TESTS: Impingement tests: Hawkins/Kennedy impingement test: positive  and Painful arc test: positive  SLAP lesions: Biceps load test: negative Instability tests: Apprehension test: positive  Rotator cuff assessment: Full can test: positive  and Belly press test: negative  JOINT MOBILITY TESTING:  No overt joint hypomobility  PALPATION:  TTP L infraspinatus, upper trap, levator scap, cervical paraspinals                                                                                                                             TREATMENT DATE:  [facility] Adult PT Treatment:                                                DATE: 07/08/24 Therapeutic Exercise: Supine chest press AAROM with dowel 2x10 Supine shoulder flexion AAROM with dowel 2x10 Supine ER AAROM with dowel 2x10 Supine shoulder flexion AAROM with hand clasped Neuromuscular re-ed: Seated scap retraction elbows clasped 2 hold 2x10 LUE shoulder isometrics ER, IR, abd, add, flex, ext 5 hold x 10 ea Seated BIL shoulder ER with scap retraction (no resistance) 2x10   06/21/24 See HEP below   PATIENT EDUCATION: Education details: Exam  findings, POC, initial HEP Person educated: Patient Education method: Explanation, Demonstration, and Handouts Education comprehension: verbalized understanding  HOME EXERCISE PROGRAM: Access Code: [email or web address] URL: [email or web address] Date: 06/21/2024 Prepared by: Gellen April Marie Nonato  Exercises - Gentle Levator Scapulae Stretch  - 2-3 x daily - 7 x weekly - 30 sec hold - Seated Upper Trapezius Stretch  - 2-3 x daily - 7 x weekly - 30 sec hold - Shoulder External Rotation and Scapular Retraction  - 1 x daily - 7 x weekly - 2-3 sets - 10 reps - Standing Shoulder Internal Rotation AAROM with Dowel  - 1 x daily - 7 x weekly - 2-3 sets - 10 reps  ASSESSMENT:  CLINICAL IMPRESSION: ***  EVAL: Patient is a 63 y.o. F who was seen today for physical therapy evaluation and treatment for L shoulder pain s/p fall ~1 month ago. Pt is L hand dominant. Assessment is significant for concurrent L side neck pain with muscular tightness/trigger points and decreased cervical ROM, postural abnormalities, L>R shoulder weakness, pain, and decreased ROM. Pt will benefit from PT to address these issues for return to PLOF.  OBJECTIVE IMPAIRMENTS: decreased activity tolerance, decreased coordination, decreased mobility, decreased ROM, decreased strength, increased fascial restrictions, increased muscle spasms, impaired UE functional use, improper body mechanics, postural dysfunction, and pain.   ACTIVITY LIMITATIONS: carrying, lifting, sleeping, bathing, toileting, dressing, reach over head, and hygiene/grooming  PARTICIPATION LIMITATIONS: meal prep, cleaning, laundry, shopping, and community activity  PERSONAL FACTORS: Age, Fitness, Past/current experiences, and Time since onset of injury/illness/exacerbation are also affecting patient's functional outcome.   REHAB POTENTIAL: Good  CLINICAL DECISION MAKING: Evolving/moderate complexity  EVALUATION COMPLEXITY: Moderate   GOALS: Goals  reviewed with patient? Yes  SHORT TERM GOALS: Target date: 07/19/2024   Pt will be ind with initial HEP Baseline: Goal status: INITIAL  2.  Pt will demo full cervical ROM without pain Baseline:  Goal status: INITIAL   LONG TERM GOALS: Target date: 08/16/2024   Pt will be ind with management and progression of HEP Baseline:  Goal status: INITIAL  2.  Pt will demo full shoulder ROM without pain Baseline:  Goal status: INITIAL  3.  Pt will have improved QuickDASH to </=75 points to demo MCID Baseline:  Goal status: INITIAL  4.  Pt will report improved overall symptoms by >/=80% Baseline:  Goal status: INITIAL   PLAN:  PT FREQUENCY: 1-2x/week  PT DURATION: 8 weeks  PLANNED INTERVENTIONS: 97164- PT Re-evaluation, 97750- Physical Performance Testing, 97110-Therapeutic exercises, 97530- Therapeutic activity, [id number]- Neuromuscular re-education, 97535- Self Care, 02859- Manual therapy, G0283- Electrical stimulation (unattended), 97016- Vasopneumatic device, [id number]- Ultrasound, [id number]- Ionotophoresis 4mg /ml Dexamethasone, 79439 (1-2 muscles), 20561 (3+ muscles)- Dry Needling, Patient/Family education, Taping, Joint mobilization, Spinal mobilization, Cryotherapy, and Moist heat  PLAN FOR NEXT SESSION: Assess response to HEP. Manual as needed to address trigger points. Gentle shoulder AAROM. Initiate isometrics/strengthening.    Corean Pouch, PTA 07/08/2024, 7:44 AM

## 2024-07-13 ENCOUNTER — Ambulatory Visit

## 2024-07-15 ENCOUNTER — Encounter

## 2024-07-20 ENCOUNTER — Encounter

## 2024-07-22 ENCOUNTER — Encounter

## 2024-08-23 ENCOUNTER — Ambulatory Visit: Payer: Self-pay | Admitting: Family Medicine

## 2024-08-23 ENCOUNTER — Ambulatory Visit: Admitting: Family Medicine

## 2024-08-23 ENCOUNTER — Encounter: Payer: Self-pay | Admitting: Family Medicine

## 2024-08-23 VITALS — BP 130/78 | HR 62 | Ht 65.0 in | Wt 281.0 lb

## 2024-08-23 DIAGNOSIS — R7303 Prediabetes: Secondary | ICD-10-CM | POA: Diagnosis not present

## 2024-08-23 DIAGNOSIS — G4733 Obstructive sleep apnea (adult) (pediatric): Secondary | ICD-10-CM | POA: Diagnosis not present

## 2024-08-23 DIAGNOSIS — Z23 Encounter for immunization: Secondary | ICD-10-CM | POA: Diagnosis present

## 2024-08-23 DIAGNOSIS — Z720 Tobacco use: Secondary | ICD-10-CM

## 2024-08-23 DIAGNOSIS — J441 Chronic obstructive pulmonary disease with (acute) exacerbation: Secondary | ICD-10-CM | POA: Diagnosis not present

## 2024-08-23 LAB — POCT GLYCOSYLATED HEMOGLOBIN (HGB A1C): HbA1c, POC (controlled diabetic range): 5.9 % (ref 0.0–7.0)

## 2024-08-23 NOTE — Progress Notes (Signed)
    SUBJECTIVE:   CHIEF COMPLAINT / HPI:   Comes to clinic today to discuss desire to lose weight. Reports difficulty exercising due to knee pain. Eats 1 meal per day at dinner. Drinks water, coffee, and low sugar sweet tea. Has not tried weight loss medicines in the past.   Has dx of OSA but does not use CPAP- lost it years ago when she moved.   Still smokes from time to time but states she could quit at any time.  Interested in meeting with [facility] pharmacist to discuss tobacco cessation.   PERTINENT  PMH / PSH: OSA, COPD, obesity  OBJECTIVE:   BP 130/78   Pulse 62   Ht 5' 5 (1.651 m)   Wt 281 lb (127.5 kg)   BMI 46.76 kg/m   - General: No acute distress. Awake and conversant. - Eyes: Normal conjunctiva, anicteric. Round symmetric pupils. - ENT: Hearing grossly intact. No nasal discharge. - Neck: Neck is supple. No masses or thyromegaly. - Respiratory: Respirations are non-labored. - Skin: No visible rashes or ulcers. - Psych: Alert and oriented. Cooperative, Appropriate mood and affect, Normal judgment. - MSK: Normal ambulation. - Neuro: Sensation and CN II-XII grossly normal.   ASSESSMENT/PLAN:   Assessment & Plan Morbid obesity (HCC) Weight has been stable compared to previous visits.  Patient motivated to initiate lifestyle changes with healthier diet and physical activity.  Provided resources and counseling process.  Advised 6 to 8-week follow-up, can discuss initiating weight loss medication in the future if lifestyle changes are ineffective. Pre-diabetes A1c 5.9.  Will send resources on pre-DM lifestyle interventions OSA (obstructive sleep apnea) Needs updated evaluation and CPAP.  Can discuss initiating Zepbound moving forward. - Ambulatory referral to Sleep Studies Tobacco abuse -- Referral to [facility] pharmacy team for tobacco cessation     Rea Raring, MD [facility]

## 2024-08-23 NOTE — Patient Instructions (Signed)
 Thank you for coming in today! Here is a summary of what we discussed:  - I sent a referral for sleep medicine so you can be evaluated and hopefully get a new CPAP.  - I also referred you to our pharmacist for help with quitting smoking.  Please make an appointment at the front desk  - Please try to start exercising daily and making changes to your diet like we discussed.  You can make a follow-up appointment in 6 to 8 weeks and we can see how things are going  Please call the clinic at [phone] if your symptoms worsen or you have any concerns.  Best, Dr Adele

## 2024-08-23 NOTE — Assessment & Plan Note (Signed)
--   Referral to [facility] pharmacy team for tobacco cessation

## 2024-08-23 NOTE — Assessment & Plan Note (Signed)
 Weight has been stable compared to previous visits.  Patient motivated to initiate lifestyle changes with healthier diet and physical activity.  Provided resources and counseling process.  Advised 6 to 8-week follow-up, can discuss initiating weight loss medication in the future if lifestyle changes are ineffective.

## 2024-08-23 NOTE — Assessment & Plan Note (Addendum)
 Needs updated evaluation and CPAP.  Can discuss initiating Zepbound moving forward. - Ambulatory referral to Sleep Studies

## 2024-08-25 ENCOUNTER — Encounter: Payer: Self-pay | Admitting: Pharmacist

## 2024-08-29 ENCOUNTER — Telehealth: Payer: Self-pay | Admitting: Pharmacist

## 2024-08-29 NOTE — Telephone Encounter (Signed)
-----   Message from Lluveras W sent at 08/29/2024 10:40 AM EST ----- Patient called and updated phone number.

## 2024-08-29 NOTE — Telephone Encounter (Signed)
 Attempted to contact patient for follow-up of request to reschedule an appointment for tobacco intake reduction/cessation.    Left HIPAA compliant voice mail requesting call back to direct phone: [phone] OR main number [phone] to schedule   Total time with patient call and documentation of interaction: 7 minutes.

## 2024-08-30 NOTE — Telephone Encounter (Signed)
 Reviewed and agree.

## 2024-09-06 ENCOUNTER — Ambulatory Visit: Admitting: Pharmacist

## 2024-09-06 ENCOUNTER — Encounter: Payer: Self-pay | Admitting: Pharmacist

## 2024-09-06 VITALS — BP 117/62 | HR 67 | Wt 279.8 lb

## 2024-09-06 DIAGNOSIS — J441 Chronic obstructive pulmonary disease with (acute) exacerbation: Secondary | ICD-10-CM | POA: Diagnosis not present

## 2024-09-06 DIAGNOSIS — Z720 Tobacco use: Secondary | ICD-10-CM | POA: Diagnosis not present

## 2024-09-06 NOTE — Assessment & Plan Note (Signed)
 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 Patient has already cut down amount of cigarettes from 1 pack a day. Normally she does not smoke at all but only smokes maybe 1/2 of a cigarette when she is stressed. Patient reports main trigger is when people smoke around her. Reports she has a strong desire to smoke after dinner or with coffee. Brother is a smoker and lives nearby and normally buys her cigarettes but would be supportive of her quitting. Patient expresses motivation to quit to avoid health issues and to see her grandchildren. She would like to quit by this Friday.  -Provided information on 1 800-QUIT NOW support program.  -Counseled on stress relief and creating better coping strategies to manage stress and anger rather than smoking.

## 2024-09-06 NOTE — Assessment & Plan Note (Signed)
 Patient reports having difficulty breathing in the past few days due to colder weather. Reports that she is using Ventolin  inhaler 3 times a day on top of Incruse (umeclidinium) once daily. Uses Ventolin  prior to going on walks.  -Consider doing a breathing test after patient has fully quit to determine lung function and optimal inhaler choice -Continue using Ventolin  and Incruse (umeclidinium) Ellipta.

## 2024-09-06 NOTE — Progress Notes (Signed)
   S:   Chief Complaint  Patient presents with   Medication Management    Tobacco cessation   63 y.o. female who presents for evaluation/assistance with tobacco dependence. Patient is in good spirits.   Patient was referred and last seen by Primary Care Provider, Dr. Adele, on 08/23/2024.   PMH is significant for tobacco abuse, prediabetes, Depression, COPD.   Longest time ever been tobacco free was 1 year.  Rates IMPORTANCE of quitting tobacco on 1-10 scale of 10. Rates CONFIDENCE of quitting tobacco for at least 2 full weeks by the end of the year on 1-10 scale of 9.  Most common triggers to use tobacco include; stress   Motivation to quit: to avoid health issues and to see grandchildren grow up.  O: Clinical ASCVD: No  The 10-year ASCVD risk score (Arnett DK, et al., 2019) is: 9.9%   Values used to calculate the score:     Age: 63 years     Clincally relevant sex: Female     Is Non-Hispanic African American: No     Diabetic: No     Tobacco smoker: Yes     Systolic Blood Pressure: 130 mmHg     Is BP treated: No     HDL Cholesterol: 34 mg/dL     Total Cholesterol: 190 mg/dL  Review of Systems  All other systems reviewed and are negative.  Physical Exam Vitals reviewed.  Constitutional:      Appearance: Normal appearance.  Neurological:     Mental Status: She is alert.  Psychiatric:        Mood and Affect: Mood normal.        Behavior: Behavior normal.        Thought Content: Thought content normal.        Judgment: Judgment normal.    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 Patient has already cut down amount of cigarettes from 1 pack a day. Normally she does not smoke at all but only smokes maybe 1/2 of a cigarette when she is stressed. Patient reports main trigger is when people  smoke around her. Reports she has a strong desire to smoke after dinner or with coffee. Brother is a smoker and lives nearby and normally buys her cigarettes but would be supportive of her quitting. Patient expresses motivation to quit to avoid health issues and to see her grandchildren. She would like to quit by this Friday 09/09/2024 -Provided information on 1 800-QUIT NOW support program.  -Counseled on stress relief and creating better coping strategies to manage stress and anger rather than smoking.  Patient reports having difficulty breathing in the past few days due to colder weather. Reports that she is using albuterol  inhaler 3 times a day on top of Incruse (umeclidinium) once daily. Routinely uses albuterol  inhaler prior to going on walks.  -Consider doing a breathing test after patient has fully quit to determine lung function and optimal inhaler choice -Continue using albuterol  MDI and Incruse (umeclidinium) Ellipta.   Written patient instructions provided. Patient verbalized understanding of treatment plan.  Total time in face to face counseling 16 minutes.    Follow-up:  PCP clinic visit 10/06/2024 Patient seen with Lawson Mao, PharmD Candidate - PY3 student and Recardo [employer] PharmD - PY4 Candidate.

## 2024-09-06 NOTE — Patient Instructions (Signed)
 Nice to see you today! Congratulations on your progress towards quitting!  Tobacco Patient Instructions  Quitting smoking is one of the most important decisions you can make for your current and future health. Consider what you dislike about smoking and how quitting could personally benefit you. Try to cut down.   My target quit date is: 09/09/2024  Starting today, Be a Quitter!  Remind yourself why you want to quit.  Delay your first cigarette of the day for as long as possible.  Start cleaning out all pockets, drawers, and your car of cigarettes.  Getting Through the Cravings Once You Are Smoke Free: Each craving will last about 10 minutes, whether or not you smoke. Here's how to get through the cravings without cigarettes:  DELAY: Tell yourself that you'll wait for the next craving. Do it every time! DEEP BREATHS: One reason smoking feels good is because you breathe in deeply to inhale. Take four slow, deep breaths and feel the relaxation without the hamful effects of cigarettes. DRINK WATER: Drink a glass of cool water. It will give your hands and mouth something to do and will help flush the nicotine  out of your system faster. DIVERT: Do something else -- brush your teeth, take a walk, call a friend who can offer you support. Just moving onto something other than thinking about cigarettes will move you through the craving.   Frequently Asked Questions  What can I do when I get the urge to smoke? To get through the urge to smoke, try the following:  Review your reasons for quitting and think of all the benefits to your health, your finances, and your family.  Remind yourself that there is no such thing as just one cigarette -- or even one puff.  Ride out the desire to smoke. Use the 4 Os -- Delay, Deep Breaths, Drink Water and Divert to get you through. The craving will go away eventually. Do not fool yourself into thinking you can have just one cigarette.  Any tips on how to deal  with stress? Stress is a natural part of life. The key is to deal with it without reaching for a cigarette. Taking deep breaths, counting backwards from 10 and asking yourself 1-how big a deal is this?"  Writing down your feelings, talking with a friend and doing things like positive self-talk and meditation are some other ways that people deal with daily stress.  What if I start smoking again? Slips happen. Most people try to quit smoking a few times before they are successful. Don't beat yourself up if this happens to you! Ask yourself if this was a slip or a relapse. A slip is a one-time mistake that is quickly corrected. A relapse is going back to your old smoking habits.   If you slip, don't give up. Think of it as a learning experience. Ask yourself what went wrong and renew your commitment to staying away from smoking for good.  If you relapse, try not to get discouraged. Ask yourself the question "What caused me to start smoking?" Figure out what helped you and what didn't when you tried to quit. Knowing why you relapsed is useful information for your next attempt to quit.  Please bring all medications to your clinic visits.  Please arrive 10-15 minutes prior to your scheduled visit time.

## 2024-09-07 NOTE — Progress Notes (Signed)
 Reviewed and agree with Dr Rennis plan.

## 2024-09-17 ENCOUNTER — Emergency Department (HOSPITAL_COMMUNITY)

## 2024-09-17 ENCOUNTER — Other Ambulatory Visit: Payer: Self-pay

## 2024-09-17 ENCOUNTER — Inpatient Hospital Stay (HOSPITAL_COMMUNITY)
Admission: EM | Admit: 2024-09-17 | Discharge: 2024-09-23 | DRG: 193 | Disposition: A | Discharge status: Home / Self Care | Attending: Family Medicine | Admitting: Family Medicine

## 2024-09-17 ENCOUNTER — Encounter (HOSPITAL_COMMUNITY): Payer: Self-pay | Admitting: *Deleted

## 2024-09-17 DIAGNOSIS — Z789 Other specified health status: Secondary | ICD-10-CM

## 2024-09-17 DIAGNOSIS — I10 Essential (primary) hypertension: Secondary | ICD-10-CM | POA: Diagnosis present

## 2024-09-17 DIAGNOSIS — R197 Diarrhea, unspecified: Secondary | ICD-10-CM | POA: Diagnosis present

## 2024-09-17 DIAGNOSIS — I509 Heart failure, unspecified: Secondary | ICD-10-CM | POA: Diagnosis not present

## 2024-09-17 DIAGNOSIS — F329 Major depressive disorder, single episode, unspecified: Secondary | ICD-10-CM | POA: Diagnosis present

## 2024-09-17 DIAGNOSIS — J188 Other pneumonia, unspecified organism: Secondary | ICD-10-CM | POA: Diagnosis present

## 2024-09-17 DIAGNOSIS — J189 Pneumonia, unspecified organism: Secondary | ICD-10-CM | POA: Diagnosis present

## 2024-09-17 DIAGNOSIS — D3500 Benign neoplasm of unspecified adrenal gland: Secondary | ICD-10-CM | POA: Diagnosis present

## 2024-09-17 DIAGNOSIS — G4733 Obstructive sleep apnea (adult) (pediatric): Secondary | ICD-10-CM | POA: Diagnosis present

## 2024-09-17 DIAGNOSIS — Z6841 Body Mass Index (BMI) 40.0 and over, adult: Secondary | ICD-10-CM

## 2024-09-17 DIAGNOSIS — F419 Anxiety disorder, unspecified: Secondary | ICD-10-CM | POA: Diagnosis present

## 2024-09-17 DIAGNOSIS — R0602 Shortness of breath: Secondary | ICD-10-CM | POA: Diagnosis not present

## 2024-09-17 DIAGNOSIS — R531 Weakness: Secondary | ICD-10-CM

## 2024-09-17 DIAGNOSIS — E785 Hyperlipidemia, unspecified: Secondary | ICD-10-CM | POA: Diagnosis present

## 2024-09-17 DIAGNOSIS — J9601 Acute respiratory failure with hypoxia: Principal | ICD-10-CM | POA: Diagnosis present

## 2024-09-17 DIAGNOSIS — E278 Other specified disorders of adrenal gland: Secondary | ICD-10-CM | POA: Insufficient documentation

## 2024-09-17 DIAGNOSIS — Z79899 Other long term (current) drug therapy: Secondary | ICD-10-CM

## 2024-09-17 DIAGNOSIS — R079 Chest pain, unspecified: Secondary | ICD-10-CM | POA: Diagnosis not present

## 2024-09-17 DIAGNOSIS — R7303 Prediabetes: Secondary | ICD-10-CM | POA: Diagnosis present

## 2024-09-17 DIAGNOSIS — J441 Chronic obstructive pulmonary disease with (acute) exacerbation: Principal | ICD-10-CM | POA: Diagnosis present

## 2024-09-17 DIAGNOSIS — J440 Chronic obstructive pulmonary disease with acute lower respiratory infection: Secondary | ICD-10-CM | POA: Diagnosis present

## 2024-09-17 DIAGNOSIS — Z87891 Personal history of nicotine dependence: Secondary | ICD-10-CM

## 2024-09-17 LAB — BASIC METABOLIC PANEL WITH GFR
Anion gap: 13 (ref 5–15)
BUN: 12 mg/dL (ref 8–23)
CO2: 23 mmol/L (ref 22–32)
Calcium: 8.4 mg/dL — ABNORMAL LOW (ref 8.9–10.3)
Chloride: 100 mmol/L (ref 98–111)
Creatinine, Ser: 0.9 mg/dL (ref 0.44–1.00)
GFR, Estimated: >60 mL/min (ref >60)
Glucose, Bld: 151 mg/dL — ABNORMAL HIGH (ref 70–99)
Potassium: 4.2 mmol/L (ref 3.5–5.1)
Sodium: 136 mmol/L (ref 135–145)

## 2024-09-17 LAB — CBG MONITORING, ED: Glucose-Capillary: 149 mg/dL — ABNORMAL HIGH (ref 70–99)

## 2024-09-17 LAB — TROPONIN I (HIGH SENSITIVITY)
Troponin I (High Sensitivity): 3 ng/L (ref <18)
Troponin I (High Sensitivity): 3 ng/L (ref <18)

## 2024-09-17 LAB — CBC
HCT: 40.5 % (ref 36.0–46.0)
Hemoglobin: 13.4 g/dL (ref 12.0–15.0)
MCH: 28 pg (ref 26.0–34.0)
MCHC: 33.1 g/dL (ref 30.0–36.0)
MCV: 84.6 fL (ref 80.0–100.0)
Platelets: 236 K/uL (ref 150–400)
RBC: 4.79 MIL/uL (ref 3.87–5.11)
RDW: 13.1 % (ref 11.5–15.5)
WBC: 8.3 K/uL (ref 4.0–10.5)
nRBC: 0 % (ref 0.0–0.2)

## 2024-09-17 LAB — RESP PANEL BY RT-PCR (RSV, FLU A&B, COVID)  RVPGX2
Influenza A by PCR: NEGATIVE
Influenza B by PCR: NEGATIVE
Resp Syncytial Virus by PCR: NEGATIVE
SARS Coronavirus 2 by RT PCR: NEGATIVE

## 2024-09-17 LAB — MAGNESIUM: Magnesium: 1.9 mg/dL (ref 1.7–2.4)

## 2024-09-17 LAB — BRAIN NATRIURETIC PEPTIDE: B Natriuretic Peptide: 19 pg/mL (ref 0.0–100.0)

## 2024-09-17 MED ORDER — METHOCARBAMOL 500 MG PO TABS
1000.0000 mg | ORAL_TABLET | Freq: Once | ORAL | Status: AC
  Administered 2024-09-17: 1000 mg via ORAL
  Filled 2024-09-17: qty 2

## 2024-09-17 MED ORDER — IPRATROPIUM-ALBUTEROL 0.5-2.5 (3) MG/3ML IN SOLN
3.0000 mL | Freq: Once | RESPIRATORY_TRACT | Status: AC
  Administered 2024-09-17: 3 mL via RESPIRATORY_TRACT
  Filled 2024-09-17: qty 3

## 2024-09-17 MED ORDER — ACETAMINOPHEN 325 MG PO TABS
ORAL_TABLET | ORAL | Status: AC
  Filled 2024-09-17: qty 2

## 2024-09-17 MED ORDER — ALBUTEROL SULFATE (2.5 MG/3ML) 0.083% IN NEBU
2.5000 mg | INHALATION_SOLUTION | Freq: Once | RESPIRATORY_TRACT | Status: AC
  Administered 2024-09-17: 2.5 mg via RESPIRATORY_TRACT
  Filled 2024-09-17: qty 3

## 2024-09-17 MED ORDER — IOHEXOL 350 MG/ML SOLN
75.0000 mL | Freq: Once | INTRAVENOUS | Status: AC | PRN
  Administered 2024-09-17: 75 mL via INTRAVENOUS

## 2024-09-17 MED ORDER — OXYCODONE-ACETAMINOPHEN 5-325 MG PO TABS
1.0000 | ORAL_TABLET | Freq: Once | ORAL | Status: AC
  Administered 2024-09-17: 1 via ORAL
  Filled 2024-09-17: qty 1

## 2024-09-17 MED ORDER — METHYLPREDNISOLONE SODIUM SUCC 125 MG IJ SOLR
125.0000 mg | Freq: Once | INTRAMUSCULAR | Status: AC
  Administered 2024-09-17: 125 mg via INTRAVENOUS
  Filled 2024-09-17: qty 2

## 2024-09-17 MED ORDER — SODIUM CHLORIDE 0.9 % IV SOLN
1.0000 g | Freq: Once | INTRAVENOUS | Status: AC
  Administered 2024-09-17: 1 g via INTRAVENOUS
  Filled 2024-09-17: qty 10

## 2024-09-17 MED ORDER — LIDOCAINE 5 % EX PTCH
1.0000 | MEDICATED_PATCH | CUTANEOUS | Status: DC
  Administered 2024-09-17 – 2024-09-22 (×6): 1 via TRANSDERMAL
  Filled 2024-09-17 (×6): qty 1

## 2024-09-17 MED ORDER — ACETAMINOPHEN 325 MG PO TABS
650.0000 mg | ORAL_TABLET | Freq: Once | ORAL | Status: AC
  Administered 2024-09-17: 650 mg via ORAL

## 2024-09-17 MED ORDER — SODIUM CHLORIDE 0.9 % IV SOLN
500.0000 mg | Freq: Once | INTRAVENOUS | Status: AC
  Administered 2024-09-17: 500 mg via INTRAVENOUS
  Filled 2024-09-17: qty 5

## 2024-09-17 NOTE — ED Provider Notes (Signed)
 [place] EMERGENCY DEPARTMENT AT [facility] Provider Note   CSN: [id number] Arrival date & time: 09/17/24  1715     Patient presents with: No chief complaint on file.   Kylie Garner is a 63 y.o. female.   HPI Patient presents for shortness of breath.  Medical history includes prediabetes, HTN, anxiety, depression, COPD, OSA.  At home, she uses inhalers only.  She does not use a spacer.  For the past 4 days, she has had worsening chest tightness, shortness of breath, and cough.  Cough has been productive of green sputum.  She has had associated right lateral lower chest wall pain.    Prior to Admission medications   Medication Sig Start Date End Date Taking? Authorizing Provider  albuterol  (PROAIR  HFA) 108 (90 Base) MCG/ACT inhaler Inhale 1-2 puffs into the lungs every 6 (six) hours as needed for wheezing or shortness of breath. 11/26/23   Jennelle Riis, MD  busPIRone  (BUSPAR ) 10 MG tablet Take 1 tablet (10 mg total) by mouth 2 (two) times daily. Patient needs to make appointment to be seen by PCP. Patient taking differently: Take 1 tablet (10 mg total) by mouth 2 (two) times daily. Patient needs to make appointment to be seen by PCP. 05/30/24   Majeed, Camie, DO  Ibuprofen 200 MG CAPS Take 600 mg by mouth as needed.    [provider]  sertraline  (ZOLOFT ) 100 MG tablet Take 1 tablet (100 mg total) by mouth daily. 11/26/23   Sowell, Brandon, MD  umeclidinium bromide  (INCRUSE ELLIPTA ) 62.5 MCG/ACT AEPB Inhale 1 puff into the lungs daily. 05/09/24   Lorrane Pac, MD    Allergies: Patient has no known allergies.    Review of Systems  Constitutional:  Positive for fatigue.  Respiratory:  Positive for cough, chest tightness, shortness of breath and wheezing.   All other systems reviewed and are negative.   Updated Vital Signs BP (!) 102/43   Pulse 86   Temp 99.5 F (37.5 C) (Oral)   Resp 20   Ht 5' 5 (1.651 m)   Wt 126.9 kg   SpO2 94%   BMI 46.56 kg/m    Physical Exam Vitals and nursing note reviewed.  Constitutional:      General: She is not in acute distress.    Appearance: Normal appearance. She is well-developed. She is not toxic-appearing or diaphoretic.  HENT:     Head: Normocephalic and atraumatic.     Right Ear: External ear normal.     Left Ear: External ear normal.     Nose: Nose normal.     Mouth/Throat:     Mouth: Mucous membranes are moist.  Eyes:     Extraocular Movements: Extraocular movements intact.     Conjunctiva/sclera: Conjunctivae normal.  Cardiovascular:     Rate and Rhythm: Normal rate and regular rhythm.     Heart sounds: No murmur heard. Pulmonary:     Effort: Tachypnea present. No respiratory distress.     Breath sounds: Wheezing and rhonchi present.  Abdominal:     Palpations: Abdomen is soft.     Tenderness: There is no abdominal tenderness.  Musculoskeletal:        General: No swelling. Normal range of motion.     Cervical back: Normal range of motion and neck supple.  Skin:    General: Skin is warm and dry.     Coloration: Skin is not jaundiced or pale.  Neurological:     General: No focal  deficit present.     Mental Status: She is alert and oriented to person, place, and time.  Psychiatric:        Mood and Affect: Mood normal.        Behavior: Behavior normal.     (all labs ordered are listed, but only abnormal results are displayed) Labs Reviewed  BASIC METABOLIC PANEL WITH GFR - Abnormal; Notable for the following components:      Result Value   Glucose, Bld 151 (*)    Calcium 8.4 (*)    All other components within normal limits  CBG MONITORING, ED - Abnormal; Notable for the following components:   Glucose-Capillary 149 (*)    All other components within normal limits  RESP PANEL BY RT-PCR (RSV, FLU A&B, COVID)  RVPGX2  CBC  BRAIN NATRIURETIC PEPTIDE  MAGNESIUM   TROPONIN I (HIGH SENSITIVITY)  TROPONIN I (HIGH SENSITIVITY)    EKG: EKG Interpretation Date/Time:  Saturday  September 17 2024 17:48:14 EST Ventricular Rate:  90 PR Interval:  160 QRS Duration:  74 QT Interval:  348 QTC Calculation: 425 R Axis:   77  Text Interpretation: Normal sinus rhythm Low voltage QRS Confirmed by Melvenia Motto [phone]) on 09/17/2024 7:31:43 PM  Radiology: DG Chest Portable 1 View Result Date: 09/17/2024 EXAM: 1 VIEW XRAY OF THE CHEST 09/17/2024 06:41:00 PM COMPARISON: 01/17/2023 CLINICAL HISTORY: shortness of breath for 3-4 days low sats chest pain hx chf asthma FINDINGS: LUNGS AND PLEURA: No focal pulmonary opacity. No pleural effusion. No pneumothorax. HEART AND MEDIASTINUM: No acute abnormality of the cardiac and mediastinal silhouettes. BONES AND SOFT TISSUES: No acute osseous abnormality. IMPRESSION: 1. No acute cardiopulmonary process. Electronically signed by: Franky Crease MD 09/17/2024 06:48 PM EST RP Workstation: [id number]     Procedures   Medications Ordered in the ED  lidocaine  (LIDODERM ) 5 % 1 patch (1 patch Transdermal Patch Applied 09/17/24 2007)  acetaminophen  (TYLENOL ) tablet 650 mg (650 mg Oral Given 09/17/24 1749)  methylPREDNISolone  sodium succinate (SOLU-MEDROL ) 125 mg/2 mL injection 125 mg (125 mg Intravenous Given 09/17/24 1857)  ipratropium-albuterol  (DUONEB) 0.5-2.5 (3) MG/3ML nebulizer solution 3 mL (3 mLs Nebulization Given 09/17/24 1850)  albuterol  (PROVENTIL ) (2.5 MG/3ML) 0.083% nebulizer solution 2.5 mg (2.5 mg Nebulization Given 09/17/24 2007)  cefTRIAXone  (ROCEPHIN ) 1 g in sodium chloride  0.9 % 100 mL IVPB (0 g Intravenous Stopped 09/17/24 2051)  azithromycin  (ZITHROMAX ) 500 mg in sodium chloride  0.9 % 250 mL IVPB (500 mg Intravenous New Bag/Given 09/17/24 2012)  methocarbamol  (ROBAXIN ) tablet 1,000 mg (1,000 mg Oral Given 09/17/24 2007)  oxyCODONE -acetaminophen  (PERCOCET/ROXICET) 5-325 MG per tablet 1 tablet (1 tablet Oral Given 09/17/24 2007)                                    Medical Decision Making Amount and/or Complexity of Data  Reviewed Labs: ordered.  Risk Prescription drug management.   This patient presents to the ED for concern of shortness of breath, this involves an extensive number of treatment options, and is a complaint that carries with it a high risk of complications and morbidity.  The differential diagnosis includes COPD exacerbation, CHF, PE, pneumonia, URI, allergic reaction, acidosis, anemia   Co morbidities / Chronic conditions that complicate the patient evaluation  prediabetes, HTN, anxiety, depression, COPD, OSA   Additional history obtained:  Additional history obtained from EMR External records from outside source obtained and reviewed including N/A   Lab  Tests:  I Ordered, and personally interpreted labs.  The pertinent results include: Normal hemoglobin, no leukocytosis, normal BNP and troponin, normal electrolytes.   Imaging Studies ordered:  I ordered imaging studies including chest x-ray, CTA chest I independently visualized and interpreted imaging which showed no acute findings on x-ray, CTA pending at time of signout. I agree with the radiologist interpretation   Cardiac Monitoring: / EKG:  The patient was maintained on a cardiac monitor.  I personally viewed and interpreted the cardiac monitored which showed an underlying rhythm of: Sinus rhythm   Problem List / ED Course / Critical interventions / Medication management  Patient presenting for worsening shortness of breath over the past 4 days.  Vital signs on arrival notable for low-grade temperature of 100.2 degrees.  SpO2 is in the low 90s on room air.  On exam, she is tachypneic with mildly increased work of breathing.  She is able to speak in complete sentences.  Shortness of breath worsens with any activity, including just shifting on the stretcher.  On lung auscultation, she has diffuse wheezing and rhonchi.  Treatment for COPD was initiated.  Workup was initiated.  Given her increased sputum production,  antibiotics were ordered.  After 2 breathing treatments, patient continues to have mildly increased work of breathing.  Her SpO2 is now 89% on room air.  Patient was placed on supplemental oxygen.  CTA was ordered.  Care of patient was signed out to oncoming ED provider. I ordered medication including Solu-Medrol , DuoNeb, albuterol , ceftriaxone , azithromycin  for COPD exacerbation with increased sputum production; Tylenol  for antipyresis; Percocet and Robaxin  for analgesia Reevaluation of the patient after these medicines showed that the patient improved I have reviewed the patients home medicines and have made adjustments as needed  Social Determinants of Health:  Lives independently     Final diagnoses:  COPD exacerbation (HCC)  Acute respiratory failure with hypoxia [facility])    ED Discharge Orders     None          Melvenia Motto, MD 09/17/24 2142

## 2024-09-17 NOTE — Assessment & Plan Note (Signed)
 Right adrenal mass measuring up to 4.5 cm with central calcifications.  Per radiology read, recommending follow-up imaging to evaluate for pheochromocytoma-however patient is not particular symptomatic for pheochromocytoma. - Consider imaging outpatient versus inpatient

## 2024-09-17 NOTE — ED Triage Notes (Signed)
 Patient in ED with complaints of shortness of breath. Shob started 4 days ago. She reports chest pain near her ribs that feel like air bubbles.   Hx of COPD, CHF, asthma.

## 2024-09-17 NOTE — ED Triage Notes (Signed)
 The pt stopped smoking one week ago

## 2024-09-17 NOTE — ED Notes (Signed)
 Pt taken for CT

## 2024-09-17 NOTE — Assessment & Plan Note (Addendum)
-   Admit to FMTS, MedSurg, attending Dr. Rumball - IV ceftriaxone  (11/22-), azithromycin  IV (11/22-) - S/p IV methylprednisolone , transition to p.o. prednisone  for additional 4 days (11/23 - 11/26) - Pain: Tylenol  as needed - Continue home Incruse - DuoNebs every 4 hours as needed - AM CBC/BMP - PT/OT

## 2024-09-17 NOTE — Hospital Course (Addendum)
 Kylie Garner is a 63 y.o.female with a history of COPD, HTN, OSA who was admitted to the [facility] Medicine Teaching Service at [facility] for acute hypoxic respiratory failure. Her hospital course is detailed below:  Acute hypoxic respiratory failure Workup suggestive of multifocal pneumonia and COPD exacerbation. Started on IV ceftriaxone  and azithromycin  (11/22- 11/26). Responded well to PRN duonebs and steroid course. Weaned to room air. She improved steadily with decreased wheezing and improved oxygenation. Ambulatory O? testing was performed; patient required 2L with activity earlier in stay--PCP to reassess home O? need based on final test results. Regimen at discharge: ***.   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 Given size, further endocrine evaluation is recommended. Serum/urine metanephrines were ordered prior to discharge. Patient discharged with recommendation for repeat imaging (CT abdomen) in 3-6 months.   Other chronic conditions were medically managed with home medications and formulary alternatives as necessary (OSA, Anxiety, MDD, and Tobacco use)  PCP Follow-up Recommendations: Repeat adrenal CT in 3-6 months Review metanephrine results when available Referral for sleep study for updated CPAP evaluation

## 2024-09-17 NOTE — H&P (Signed)
 Hospital Admission History and Physical Service Pager: [phone]  Patient name: Kylie Garner Medical record number: [id number] Date of Birth: 03/29/1961 Age: 63 y.o. Gender: female  Primary Care Provider: Camie Dixons DO Consultants: None Code Status: FULL CODE Preferred Emergency Contact: Elsie Moats [phone] or [phone]  Chief Complaint: SOB  Differential and Medical Decision Making:  Kylie Garner is a 63 y.o. female presenting with acute hypoxic respiratory failure.  Differential for this patient's presentation of this includes: 1.)  Multifocal pneumonia: Hypoxic, evidence on CTA.  Reassuringly no leukocytosis nor fever. 2.) COPD exacerbation: Current smoker, increased sputum production, concurrent multifocal pneumonia. 3.)  Low concern for ACS, heart failure given reassuring troponins/EKG and BNP within normal limits. 4.) low concern for PE given CTA findings  Assessment & Plan Acute hypoxic respiratory failure (HCC) - Admit to FMTS, MedSurg, attending Dr. Rumball - IV ceftriaxone  (11/22-), azithromycin  IV (11/22-) - S/p IV methylprednisolone , transition to p.o. 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  Per radiology read, recommending follow-up imaging to evaluate for pheochromocytoma-however patient is not particular symptomatic for pheochromocytoma. - Consider imaging outpatient versus inpatient Chronic health problem OSA: CPAP Anxiety/depression: Continue home sertraline /buspirone   FEN/GI: Regular VTE Prophylaxis: Lovenox   Disposition: MedSurg  History of Present Illness:  Kylie Garner is a 63 y.o. female presenting with SOB increased coughing.  Chest tightness, cough, SOB for 4 days. Increased sputum. No fevers. Quit smoking for 1 week. No  oxygen use at home.   In the ED, patient was hypoxic started on nasal cannula.  Wheezing on exam.  CTA negative for PE, however does show multifocal pneumonia.  Labs otherwise unremarkable.  Started on COPD exacerbation treatment as well as antibiotics for pneumonia.  Paged for admission.  Review Of Systems: Per HPI  Pertinent Past Medical History: Tobacco use Morbid obesity COPD Prediabetes Remainder reviewed in history tab.   Pertinent Past Surgical History: No pertinent history Remainder reviewed in history tab.  Pertinent Social History: Tobacco use: Quit 1 week. PPD prior Alcohol use: None Other Substance use: None Lives with nephew  Pertinent Family History: Reviewed in chart  Important Outpatient Medications: Albuterol  inhaler Buspirone  10 mg twice daily Sertraline  100 mg daily Increase Ellipta 1 puff daily   Objective: BP (!) 110/58   Pulse 64   Temp 99.5 F (37.5 C) (Oral)   Resp 17   Ht 5' 5 (1.651 m)   Wt 126.9 kg   SpO2 95%   BMI 46.56 kg/m  Exam: General: NAD, resting in bed Neck: FROM Cardiovascular: RRR Respiratory: Diffuse bilateral expiratory wheezing, normal WOB on 3L LFNC Neuro: A&Ox4  Labs:  CBC BMET  Recent Labs  Lab 09/17/24 1913  WBC 8.3  HGB 13.4  HCT 40.5  PLT 236   Recent Labs  Lab 09/17/24 1913  NA 136  K 4.2  CL 100  CO2 23  BUN 12  CREATININE 0.90  GLUCOSE 151*  CALCIUM 8.4*     EKG: Sinus, regular rate and rhythm.  No acute ischemia.  Imaging Studies Performed:  CT angio chest 1. No pulmonary embolism. 2. Multifocal pneumonia with patchy airspace opacities greatest in the right upper lobe and lesser involvement of the right lower lobe. Likely reactive adenopathy is noted. 3. Right adrenal mass measuring  up to 4.5 cm with central calcifications, macroscopic fat, and 17 HU, indeterminate; recommend adrenal washout CT or chemical-shift MR and biochemical evaluation for pheochromocytoma, with surgical  consultation considered.  Kylie Lunger, DO 09/17/2024, 11:40 PM PGY-3, [facility]  FPTS Intern pager: [phone], text pages welcome Secure chat group [facility] Teaching Service    FMTS Attending Admission Note: Kylie Lai, DO   For questions about this patient, please use [email or web address] to page the family medicine resident on call. Pager number [phone].    I have seen and examined this patient, and reviewed their chart. I have discussed this patient with the resident. I agree with the resident's findings, assessment and care plan.  Briefly, Kylie Garner is a 63yo F with PMH COPD not on home supplemental O2, depression, obesity, tobacco use, prediabetes, OSA who presents for few day h/o progressively worsening SOB found to be hypoxic in ED with CTA chest concerning for multifocal PNA. Diffuse inspiratory and expiratory wheezing on my exam with supplemental O2 need.  Acute hypoxic respiratory failure - 2/2 multifocal PNA, COPD exacerbation. Scheduled duonebs, antibiotics, steroids as below.  Adrenal incidentaloma - discussed. Patient wishes imaging to be performed here, ordered. Agree no symptoms to concern for pheochromocytoma.   Remainder per excellent resident note.

## 2024-09-17 NOTE — Assessment & Plan Note (Signed)
 OSA: CPAP Anxiety/depression: Continue home sertraline /buspirone 

## 2024-09-18 ENCOUNTER — Inpatient Hospital Stay (HOSPITAL_COMMUNITY)

## 2024-09-18 DIAGNOSIS — D3500 Benign neoplasm of unspecified adrenal gland: Secondary | ICD-10-CM | POA: Diagnosis not present

## 2024-09-18 DIAGNOSIS — J9601 Acute respiratory failure with hypoxia: Secondary | ICD-10-CM

## 2024-09-18 DIAGNOSIS — J189 Pneumonia, unspecified organism: Secondary | ICD-10-CM | POA: Diagnosis not present

## 2024-09-18 DIAGNOSIS — E785 Hyperlipidemia, unspecified: Secondary | ICD-10-CM | POA: Diagnosis not present

## 2024-09-18 DIAGNOSIS — F419 Anxiety disorder, unspecified: Secondary | ICD-10-CM | POA: Diagnosis not present

## 2024-09-18 DIAGNOSIS — G4733 Obstructive sleep apnea (adult) (pediatric): Secondary | ICD-10-CM | POA: Diagnosis not present

## 2024-09-18 DIAGNOSIS — Z87891 Personal history of nicotine dependence: Secondary | ICD-10-CM | POA: Diagnosis not present

## 2024-09-18 DIAGNOSIS — R7303 Prediabetes: Secondary | ICD-10-CM | POA: Diagnosis not present

## 2024-09-18 DIAGNOSIS — I10 Essential (primary) hypertension: Secondary | ICD-10-CM | POA: Diagnosis not present

## 2024-09-18 DIAGNOSIS — Z6841 Body Mass Index (BMI) 40.0 and over, adult: Secondary | ICD-10-CM | POA: Diagnosis not present

## 2024-09-18 DIAGNOSIS — Z79899 Other long term (current) drug therapy: Secondary | ICD-10-CM | POA: Diagnosis not present

## 2024-09-18 DIAGNOSIS — I700 Atherosclerosis of aorta: Secondary | ICD-10-CM | POA: Diagnosis not present

## 2024-09-18 DIAGNOSIS — J441 Chronic obstructive pulmonary disease with (acute) exacerbation: Secondary | ICD-10-CM | POA: Diagnosis not present

## 2024-09-18 DIAGNOSIS — E279 Disorder of adrenal gland, unspecified: Secondary | ICD-10-CM | POA: Diagnosis not present

## 2024-09-18 DIAGNOSIS — J440 Chronic obstructive pulmonary disease with acute lower respiratory infection: Secondary | ICD-10-CM | POA: Diagnosis not present

## 2024-09-18 DIAGNOSIS — F329 Major depressive disorder, single episode, unspecified: Secondary | ICD-10-CM | POA: Diagnosis not present

## 2024-09-18 DIAGNOSIS — R197 Diarrhea, unspecified: Secondary | ICD-10-CM | POA: Diagnosis not present

## 2024-09-18 LAB — BASIC METABOLIC PANEL WITH GFR
Anion gap: 13 (ref 5–15)
BUN: 12 mg/dL (ref 8–23)
CO2: 22 mmol/L (ref 22–32)
Calcium: 8.7 mg/dL — ABNORMAL LOW (ref 8.9–10.3)
Chloride: 102 mmol/L (ref 98–111)
Creatinine, Ser: 0.76 mg/dL (ref 0.44–1.00)
GFR, Estimated: >60 mL/min (ref >60)
Glucose, Bld: 167 mg/dL — ABNORMAL HIGH (ref 70–99)
Potassium: 4.3 mmol/L (ref 3.5–5.1)
Sodium: 137 mmol/L (ref 135–145)

## 2024-09-18 LAB — CBC
HCT: 41.1 % (ref 36.0–46.0)
Hemoglobin: 13.5 g/dL (ref 12.0–15.0)
MCH: 27.4 pg (ref 26.0–34.0)
MCHC: 32.8 g/dL (ref 30.0–36.0)
MCV: 83.5 fL (ref 80.0–100.0)
Platelets: 218 K/uL (ref 150–400)
RBC: 4.92 MIL/uL (ref 3.87–5.11)
RDW: 13.1 % (ref 11.5–15.5)
WBC: 7.9 K/uL (ref 4.0–10.5)
nRBC: 0 % (ref 0.0–0.2)

## 2024-09-18 LAB — HIV ANTIBODY (ROUTINE TESTING W REFLEX): HIV Screen 4th Generation wRfx: NONREACTIVE

## 2024-09-18 MED ORDER — PREDNISONE 20 MG PO TABS
40.0000 mg | ORAL_TABLET | Freq: Every day | ORAL | Status: AC
  Administered 2024-09-18 – 2024-09-21 (×4): 40 mg via ORAL
  Filled 2024-09-18 (×4): qty 2

## 2024-09-18 MED ORDER — AZITHROMYCIN 500 MG PO TABS: 500.0000 mg | ORAL_TABLET | Freq: Every day | ORAL | Status: DC

## 2024-09-18 MED ORDER — AMOXICILLIN-POT CLAVULANATE 875-125 MG PO TABS
1.0000 | ORAL_TABLET | Freq: Two times a day (BID) | ORAL | Status: AC
  Administered 2024-09-18 – 2024-09-21 (×8): 1 via ORAL
  Filled 2024-09-18 (×8): qty 1

## 2024-09-18 MED ORDER — MAGNESIUM SULFATE 2 GM/50ML IV SOLN
2.0000 g | Freq: Once | INTRAVENOUS | Status: AC
  Administered 2024-09-18: 2 g via INTRAVENOUS
  Filled 2024-09-18: qty 50

## 2024-09-18 MED ORDER — SERTRALINE HCL 100 MG PO TABS
100.0000 mg | ORAL_TABLET | Freq: Every day | ORAL | Status: DC
  Administered 2024-09-18 – 2024-09-23 (×6): 100 mg via ORAL
  Filled 2024-09-18 (×6): qty 1

## 2024-09-18 MED ORDER — ACETAMINOPHEN 325 MG PO TABS: 650.0000 mg | ORAL_TABLET | Freq: Four times a day (QID) | ORAL | Status: DC | PRN

## 2024-09-18 MED ORDER — SODIUM CHLORIDE 0.9 % IV SOLN: 500.0000 mg | Freq: Once | INTRAVENOUS | Status: DC

## 2024-09-18 MED ORDER — AZITHROMYCIN 500 MG PO TABS
500.0000 mg | ORAL_TABLET | Freq: Every day | ORAL | Status: AC
  Administered 2024-09-18 – 2024-09-19 (×2): 500 mg via ORAL
  Filled 2024-09-18 (×2): qty 1

## 2024-09-18 MED ORDER — ACETAMINOPHEN 650 MG RE SUPP
650.0000 mg | Freq: Four times a day (QID) | RECTAL | Status: DC | PRN
Start: 2024-09-18 — End: 2024-09-23

## 2024-09-18 MED ORDER — UMECLIDINIUM BROMIDE 62.5 MCG/ACT IN AEPB
1.0000 | INHALATION_SPRAY | Freq: Every day | RESPIRATORY_TRACT | Status: DC
  Administered 2024-09-18 – 2024-09-19 (×2): 1 via RESPIRATORY_TRACT
  Filled 2024-09-18: qty 7

## 2024-09-18 MED ORDER — AMOXICILLIN-POT CLAVULANATE 875-125 MG PO TABS: 1.0000 | ORAL_TABLET | Freq: Two times a day (BID) | ORAL | Status: DC

## 2024-09-18 MED ORDER — SODIUM CHLORIDE 0.9 % IV SOLN: 1.0000 g | Freq: Once | INTRAVENOUS | Status: DC

## 2024-09-18 MED ORDER — ENOXAPARIN SODIUM 60 MG/0.6ML IJ SOSY
60.0000 mg | PREFILLED_SYRINGE | INTRAMUSCULAR | Status: DC
  Administered 2024-09-18 – 2024-09-23 (×6): 60 mg via SUBCUTANEOUS
  Filled 2024-09-18 (×6): qty 0.6

## 2024-09-18 MED ORDER — BUSPIRONE HCL 5 MG PO TABS
10.0000 mg | ORAL_TABLET | Freq: Two times a day (BID) | ORAL | Status: DC
  Administered 2024-09-18 – 2024-09-23 (×11): 10 mg via ORAL
  Filled 2024-09-18 (×11): qty 2

## 2024-09-18 MED ORDER — IPRATROPIUM-ALBUTEROL 0.5-2.5 (3) MG/3ML IN SOLN: 3.0000 mL | RESPIRATORY_TRACT | Status: DC | PRN

## 2024-09-18 NOTE — H&P (Incomplete)
 Hospital Admission History and Physical Service Pager: [phone]  Patient name: Kylie Garner Medical record number: [id number] Date of Birth: December 09, 1960 Age: 63 y.o. Gender: female  Primary Care Provider: Camie Dixons DO Consultants: None Code Status: *** which was confirmed with family if patient unable to confirm ***  Preferred Emergency Contact: ***  Chief Complaint: ***  Differential and Medical Decision Making:  Kylie Garner is a 63 y.o. female presenting with acute hypoxic respiratory failure.  Differential for this patient's presentation of this includes: 1.)  Multifocal pneumonia: Hypoxic, evidence on CTA.  Reassuringly no leukocytosis nor fever. 2.) COPD exacerbation: Current smoker, increased sputum production, concurrent multifocal pneumonia. 3.)  Low concern for ACS, heart failure given reassuring troponins/EKG and BNP within normal limits. 4.) low concern for PE given CTA findings  Assessment & Plan Acute hypoxic respiratory failure (HCC) - Admit to FMTS, MedSurg, attending Dr. Rumball - IV ceftriaxone  (11/22-), azithromycin  IV (11/22-) - S/p IV methylprednisolone , transition to p.o. 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  Per radiology read, recommending follow-up imaging to evaluate for pheochromocytoma-however patient is not particular symptomatic for pheochromocytoma. - Consider imaging outpatient versus inpatient Chronic health problem OSA: CPAP Anxiety/depression: Continue home sertraline /buspirone   FEN/GI: Regular VTE Prophylaxis: Lovenox   Disposition: MedSurg  History of Present Illness:  Kylie Garner is a 63 y.o. female presenting with ***    In the ED, patient was hypoxic started on nasal cannula.  Wheezing on exam.  CTA negative for PE, however does show  multifocal pneumonia.  Labs otherwise unremarkable.  Started on COPD exacerbation treatment as well as antibiotics for pneumonia.  Paged for admission.  Review Of Systems: Per HPI  Pertinent Past Medical History: Tobacco use Morbid obesity COPD Prediabetes Remainder reviewed in history tab.   Pertinent Past Surgical History: No pertinent history Remainder reviewed in history tab.  Pertinent Social History: Tobacco use: Yes Alcohol use: *** Other Substance use: *** Lives with ***  Pertinent Family History: Reviewed in chart  Important Outpatient Medications: Albuterol  inhaler Buspirone  10 mg twice daily Sertraline  100 mg daily Increase Ellipta 1 puff daily   Objective: BP (!) 110/58   Pulse 64   Temp 99.5 F (37.5 C) (Oral)   Resp 17   Ht 5' 5 (1.651 m)   Wt 126.9 kg   SpO2 95%   BMI 46.56 kg/m  Exam: General: *** Eyes: *** ENTM: *** Neck: *** Cardiovascular: *** Respiratory: *** Gastrointestinal: *** MSK: *** Derm: *** Neuro: *** Psych: ***  Labs:  CBC BMET  Recent Labs  Lab 09/17/24 1913  WBC 8.3  HGB 13.4  HCT 40.5  PLT 236   Recent Labs  Lab 09/17/24 1913  NA 136  K 4.2  CL 100  CO2 23  BUN 12  CREATININE 0.90  GLUCOSE 151*  CALCIUM 8.4*     EKG: Sinus, regular rate and rhythm.  No acute ischemia.   Imaging Studies Performed:  CT angio chest 1. No pulmonary embolism. 2. Multifocal pneumonia with patchy airspace opacities greatest in the right upper lobe and lesser involvement of the right lower lobe. Likely reactive adenopathy is noted. 3. Right adrenal mass measuring up to 4.5 cm with central calcifications, macroscopic fat, and 17 HU, indeterminate; recommend adrenal washout CT or chemical-shift MR and biochemical evaluation for  pheochromocytoma, with surgical consultation considered.  Kylie Lunger, DO 09/17/2024, 11:40 PM PGY-3, [facility]  FPTS Intern pager: [phone], text pages welcome Secure  chat group [facility] Teaching Service

## 2024-09-18 NOTE — Evaluation (Signed)
 Occupational Therapy Evaluation Patient Details Name: Kylie Garner MRN: [id number] DOB: [birth year] Today's Date: 09/18/2024   History of Present Illness   63  y/o F presenting to ED on 11/22 with SOB x4 days, CT chest with multifocal PNA. Admitted for acute hypoxic respiratory failure.    PMH includes COPD, CHF, asthma, prediabetes, HTN, anxiety, depression, OSA     Clinical Impressions Pt ind at baseline with ADL/functional mobility, lives with family who can assist at d/c. Pt currently close to baseline, overall needs up to min A for ADLs, mod I for bed mobility and CGA for transfers with RW. Pt desatting to 86% on RA with ambulation, returns to 90s with 2L O2 donned at end of session and cues for PLB. Pt overall with decr activity tolerance. Pt presenting with impairments listed below, will follow acutely. Anticipate no OT follow up needs at d/c pending progression.  SpO2 95% RA supine SpO2 92% RA seated EOB SpO2 91% RA standing SpO2 86% RA ambulation SpO2 93% on 2L EOB end of session     If plan is discharge home, recommend the following:   A little help with bathing/dressing/bathroom;A little help with walking and/or transfers;Assistance with cooking/housework;Assist for transportation;Help with stairs or ramp for entrance     Functional Status Assessment   Patient has had a recent decline in their functional status and demonstrates the ability to make significant improvements in function in a reasonable and predictable amount of time.     Equipment Recommendations   Tub/shower seat     Recommendations for Other Services   PT consult     Precautions/Restrictions         Mobility Bed Mobility Overal bed mobility: Modified Independent                  Transfers Overall transfer level: Needs assistance Equipment used: Rolling walker (2 wheels) Transfers: Sit to/from Stand Sit to Stand: Contact guard assist                  Balance  Overall balance assessment: Needs assistance   Sitting balance-Leahy Scale: Good     Standing balance support: During functional activity, Reliant on assistive device for balance Standing balance-Leahy Scale: Fair                             ADL either performed or assessed with clinical judgement   ADL Overall ADL's : Needs assistance/impaired Eating/Feeding: Supervision/ safety   Grooming: Supervision/safety   Upper Body Bathing: Minimal assistance   Lower Body Bathing: Minimal assistance   Upper Body Dressing : Contact guard assist   Lower Body Dressing: Minimal assistance   Toilet Transfer: Contact guard assist;Ambulation;Rolling walker (2 wheels)   Toileting- Clothing Manipulation and Hygiene: Contact guard assist       Functional mobility during ADLs: Contact guard assist;Rolling walker (2 wheels)       Vision Baseline Vision/History: 1 Wears glasses Vision Assessment?: No apparent visual deficits     Perception Perception: Not tested       Praxis Praxis: Not tested       Pertinent Vitals/Pain Pain Assessment Pain Assessment: No/denies pain     Extremity/Trunk Assessment Upper Extremity Assessment Upper Extremity Assessment: Overall WFL for tasks assessed   Lower Extremity Assessment Lower Extremity Assessment: Defer to PT evaluation   Cervical / Trunk Assessment Cervical / Trunk Assessment: Normal   Communication Communication Communication: No apparent difficulties  Cognition Arousal: Alert Behavior During Therapy: WFL for tasks assessed/performed Cognition: No apparent impairments                               Following commands: Intact       Cueing  General Comments   Cueing Techniques: Verbal cues  Spo2 down to 86% on RA with ambulation   Exercises     Shoulder Instructions      Home Living Family/patient expects to be discharged to:: Private residence Living Arrangements: Other relatives  (nephew) Available Help at Discharge: Family Type of Home: House Home Access: Level entry     Home Layout: One level     Bathroom Shower/Tub: Chief Strategy Officer: Standard     Home Equipment: Rollator (4 wheels)   Additional Comments: no O2 at baseline      Prior Functioning/Environment Prior Level of Function : Needs assist;Driving             Mobility Comments: rollator at all times for mobility ADLs Comments: ind, manages own meds, drives    OT Problem List: Decreased strength;Decreased activity tolerance;Decreased range of motion;Impaired balance (sitting and/or standing);Decreased cognition;Decreased safety awareness;Cardiopulmonary status limiting activity   OT Treatment/Interventions: Self-care/ADL training;Therapeutic exercise;Energy conservation;DME and/or AE instruction;Therapeutic activities;Patient/family education;Balance training      OT Goals(Current goals can be found in the care plan section)   Acute Rehab OT Goals Patient Stated Goal: none stated OT Goal Formulation: With patient Time For Goal Achievement: 10/02/24 Potential to Achieve Goals: Good ADL Goals Pt Will Perform Upper Body Dressing: with modified independence;sitting Pt Will Perform Lower Body Dressing: with modified independence;sitting/lateral leans;sit to/from stand Pt Will Transfer to Toilet: with modified independence;ambulating;regular height toilet Additional ADL Goal #1: pt will verbalize 3 energy conservation strategies in prep for ADLs   OT Frequency:  Min 2X/week    Co-evaluation              AM-PAC OT 6 Clicks Daily Activity     Outcome Measure Help from another person eating meals?: A Little Help from another person taking care of personal grooming?: A Little Help from another person toileting, which includes using toliet, bedpan, or urinal?: A Little Help from another person bathing (including washing, rinsing, drying)?: A Little Help from  another person to put on and taking off regular upper body clothing?: A Little Help from another person to put on and taking off regular lower body clothing?: A Little 6 Click Score: 18   End of Session Equipment Utilized During Treatment: Rolling walker (2 wheels) Nurse Communication: Mobility status  Activity Tolerance: Patient tolerated treatment well Patient left: in bed;with call bell/phone within reach  OT Visit Diagnosis: Unsteadiness on feet (R26.81);Other abnormalities of gait and mobility (R26.89);Muscle weakness (generalized) (M62.81)                Time: 8946-8888 OT Time Calculation (min): 18 min Charges:  OT General Charges $OT Visit: 1 Visit OT Evaluation $OT Eval Low Complexity: 1 Low  Laneta POUR, OTD, OTR/L SecureChat Preferred Acute Rehab [phone]   Laneta POUR Koonce 09/18/2024, 12:17 PM

## 2024-09-18 NOTE — Progress Notes (Signed)
 Physical Therapy Treatment Note  (Full PT eval note to follow)  SATURATION QUALIFICATIONS: (This note is used to comply with regulatory documentation for home oxygen)  Patient Saturations on Room Air at Rest = 91%  Patient Saturations on Room Air while Ambulating = 87%  Patient Saturations on 2 Liters of oxygen while Ambulating = 94%  Please briefly explain why patient needs home oxygen: Patient requires supplemental oxygen to maintain oxygen saturations at acceptable, safe levels with physical activity.  Silvano Currier, PT  Acute Rehabilitation Services Office [phone] Secure Chat welcomed

## 2024-09-18 NOTE — Evaluation (Signed)
 Physical Therapy Evaluation Patient Details Name: Kylie Garner MRN: [id number] DOB: 29-Sep-1961 Today's Date: 09/18/2024  History of Present Illness  64  y/o F presenting to ED on 11/22 with SOB x4 days, CT chest with multifocal PNA. Admitted for acute hypoxic respiratory failure.    PMH includes COPD, CHF, asthma, prediabetes, HTN, anxiety, depression, OSA  Clinical Impression  Pt admitted with above diagnosis. Lives at home with family, in a single-level home with a few steps to enter; Prior to admission, pt was managing independently, using rollator for ambulation; Presents to PT with decr activity tolerance, decr O2 sats with activity on room air;  Managing rollator well -- Patient requires supplemental oxygen to maintain oxygen saturations at acceptable, safe levels with physical activity; Pt currently with functional limitations due to the deficits listed below (see PT Problem List). Pt will benefit from skilled PT to increase their independence and safety with mobility to allow discharge to the venue listed below.           If plan is discharge home, recommend the following: A little help with walking and/or transfers;A little help with bathing/dressing/bathroom   Can travel by private vehicle        Equipment Recommendations None recommended by PT  Recommendations for Other Services       Functional Status Assessment Patient has had a recent decline in their functional status and demonstrates the ability to make significant improvements in function in a reasonable and predictable amount of time.     Precautions / Restrictions Precautions Precautions: Other (comment) Recall of Precautions/Restrictions: Intact Precaution/Restrictions Comments: watch O2 sats Restrictions Weight Bearing Restrictions Per Provider Order: No      Mobility  Bed Mobility Overal bed mobility: Modified Independent                  Transfers Overall transfer level: Needs  assistance Equipment used: Rolling walker (2 wheels) Transfers: Sit to/from Stand Sit to Stand: Contact guard assist           General transfer comment: Showing good knowledge of rollator brake use    Ambulation/Gait Ambulation/Gait assistance: Supervision Gait Distance (Feet): 150 Feet Assistive device: Rollator (4 wheels) Gait Pattern/deviations: Step-through pattern       General Gait Details: Good use of Rollator for UE support, energy conservation; one seated rest brake during amb bout  Stairs            Wheelchair Mobility     Tilt Bed    Modified Rankin (Stroke Patients Only)       Balance Overall balance assessment: Needs assistance   Sitting balance-Leahy Scale: Good     Standing balance support: During functional activity, Reliant on assistive device for balance Standing balance-Leahy Scale: Fair                               Pertinent Vitals/Pain Pain Assessment Pain Assessment: No/denies pain    Home Living Family/patient expects to be discharged to:: Private residence Living Arrangements: Other relatives (nephew) Available Help at Discharge: Family Type of Home: House Home Access: Level entry       Home Layout: One level Home Equipment: Rollator (4 wheels) Additional Comments: no O2 at baseline    Prior Function Prior Level of Function : Needs assist;Driving             Mobility Comments: rollator at all times for mobility ADLs Comments: ind, manages own meds,  drives     Extremity/Trunk Assessment   Upper Extremity Assessment Upper Extremity Assessment: Defer to OT evaluation    Lower Extremity Assessment Lower Extremity Assessment: Generalized weakness    Cervical / Trunk Assessment Cervical / Trunk Assessment: Normal  Communication   Communication Communication: No apparent difficulties    Cognition Arousal: Alert Behavior During Therapy: WFL for tasks assessed/performed                              Following commands: Intact       Cueing Cueing Techniques: Verbal cues     General Comments General comments (skin integrity, edema, etc.): Spo2 down to 87% on RA with ambulation    Exercises     Assessment/Plan    PT Assessment Patient needs continued PT services  PT Problem List Decreased strength;Decreased activity tolerance;Cardiopulmonary status limiting activity (decr functional capacity)       PT Treatment Interventions DME instruction;Gait training;Stair training;Functional mobility training;Therapeutic activities;Therapeutic exercise;Balance training;Patient/family education;Manual techniques    PT Goals (Current goals can be found in the Care Plan section)  Acute Rehab PT Goals Patient Stated Goal: Move well enough to be able to dc home PT Goal Formulation: With patient Time For Goal Achievement: 10/02/24 Potential to Achieve Goals: Good    Frequency Min 2X/week     Co-evaluation               AM-PAC PT 6 Clicks Mobility  Outcome Measure Help needed turning from your back to your side while in a flat bed without using bedrails?: None Help needed moving from lying on your back to sitting on the side of a flat bed without using bedrails?: None Help needed moving to and from a bed to a chair (including a wheelchair)?: None Help needed standing up from a chair using your arms (e.g., wheelchair or bedside chair)?: None Help needed to walk in hospital room?: None Help needed climbing 3-5 steps with a railing? : A Little 6 Click Score: 23    End of Session Equipment Utilized During Treatment: Oxygen Activity Tolerance: Patient tolerated treatment well Patient left: in bed;with call bell/phone within reach;with family/visitor present Nurse Communication: Mobility status PT Visit Diagnosis: Other abnormalities of gait and mobility (R26.89)    Time: 8547-8493 PT Time Calculation (min) (ACUTE ONLY): 14 min   Charges:   PT  Evaluation $PT Eval Low Complexity: 1 Low   PT General Charges $$ ACUTE PT VISIT: 1 Visit         Silvano Currier, PT  Acute Rehabilitation Services Office [phone] Secure Chat welcomed   Silvano VEAR Currier 09/18/2024, 5:08 PM

## 2024-09-18 NOTE — Plan of Care (Signed)
  Problem: Education: Goal: Knowledge of General Education information will improve Description: Including pain rating scale, medication(s)/side effects and non-pharmacologic comfort measures Outcome: Progressing   Problem: Health Behavior/Discharge Planning: Goal: Ability to manage health-related needs will improve Outcome: Progressing   Problem: Clinical Measurements: Goal: Will remain free from infection Outcome: Progressing Goal: Respiratory complications will improve Outcome: Progressing   Problem: Activity: Goal: Risk for activity intolerance will decrease Outcome: Progressing   Problem: Nutrition: Goal: Adequate nutrition will be maintained Outcome: Progressing   

## 2024-09-19 DIAGNOSIS — J9601 Acute respiratory failure with hypoxia: Secondary | ICD-10-CM | POA: Diagnosis not present

## 2024-09-19 LAB — BASIC METABOLIC PANEL WITH GFR
Anion gap: 6 (ref 5–15)
BUN: 23 mg/dL (ref 8–23)
CO2: 29 mmol/L (ref 22–32)
Calcium: 8.5 mg/dL — ABNORMAL LOW (ref 8.9–10.3)
Chloride: 103 mmol/L (ref 98–111)
Creatinine, Ser: 0.96 mg/dL (ref 0.44–1.00)
GFR, Estimated: >60 mL/min (ref >60)
Glucose, Bld: 115 mg/dL — ABNORMAL HIGH (ref 70–99)
Potassium: 4.2 mmol/L (ref 3.5–5.1)
Sodium: 138 mmol/L (ref 135–145)

## 2024-09-19 LAB — MAGNESIUM: Magnesium: 2.4 mg/dL (ref 1.7–2.4)

## 2024-09-19 MED ORDER — IPRATROPIUM-ALBUTEROL 0.5-2.5 (3) MG/3ML IN SOLN: 3.0000 mL | Freq: Three times a day (TID) | RESPIRATORY_TRACT | Status: DC

## 2024-09-19 MED ORDER — BACID PO TABS
2.0000 | ORAL_TABLET | Freq: Three times a day (TID) | ORAL | Status: DC
  Filled 2024-09-19 (×2): qty 2

## 2024-09-19 MED ORDER — IPRATROPIUM-ALBUTEROL 0.5-2.5 (3) MG/3ML IN SOLN: 3.0000 mL | RESPIRATORY_TRACT | Status: DC | PRN

## 2024-09-19 MED ORDER — UMECLIDINIUM-VILANTEROL 62.5-25 MCG/ACT IN AEPB
1.0000 | INHALATION_SPRAY | Freq: Every day | RESPIRATORY_TRACT | Status: DC
  Administered 2024-09-20 – 2024-09-23 (×4): 1 via RESPIRATORY_TRACT
  Filled 2024-09-19: qty 14

## 2024-09-19 MED ORDER — IPRATROPIUM-ALBUTEROL 0.5-2.5 (3) MG/3ML IN SOLN
3.0000 mL | RESPIRATORY_TRACT | Status: DC
  Administered 2024-09-19: 3 mL via RESPIRATORY_TRACT
  Filled 2024-09-19: qty 3

## 2024-09-19 MED ORDER — RISAQUAD PO CAPS
2.0000 | ORAL_CAPSULE | Freq: Three times a day (TID) | ORAL | Status: DC
  Administered 2024-09-19 – 2024-09-23 (×10): 2 via ORAL
  Filled 2024-09-19 (×11): qty 2

## 2024-09-19 MED ORDER — IPRATROPIUM-ALBUTEROL 0.5-2.5 (3) MG/3ML IN SOLN: 3.0000 mL | Freq: Two times a day (BID) | RESPIRATORY_TRACT | Status: DC

## 2024-09-19 NOTE — Progress Notes (Signed)
 Physical Therapy Treatment Note  (Full note to follow)  SATURATION QUALIFICATIONS: (This note is used to comply with regulatory documentation for home oxygen)  Patient Saturations on Room Air at Rest = 94%  Patient Saturations on Room Air while Ambulating = 86%  Patient Saturations on 2 Liters of oxygen while Ambulating = 92%  Please briefly explain why patient needs home oxygen: Patient requires supplemental oxygen to maintain oxygen saturations at acceptable, safe levels with physical activity.  Silvano Currier, PT  Acute Rehabilitation Services Office [phone] Secure Chat welcomed

## 2024-09-19 NOTE — Progress Notes (Signed)
     Daily Progress Note Intern Pager: [phone]  Patient name: Kylie Garner Medical record number: [id number] Date of birth: Mar 20, 1961 Age: 63 y.o. Gender: female  Primary Care Provider: Lupie Credit, DO Consultants: None Code Status: Full Code confirmed with patient  Pt Overview and Major Events to Date:  11/22: Admitted to FMTS   Assessment and Plan: Kylie Garner is a 63 year old female presenting with acute hypoxic respiratory failure with a PMHx of HTN, COPD, OSA, anxiety, adrenal incidentaloma.   Patient on RA with no increased WOB. Ambulating with PT/OT patient required 2L O2. Recommendations for home O2. Will schedule DuoNebs and reassess.  Repeat imaging of adrenal incidentaloma most consistent with benign adrenal adenoma, recommendations for repeat CT abdomen in 3 to 6 months with biomedical studies to r/o functioning adenoma. Metanephrines ordered. Assessment & Plan Acute hypoxic respiratory failure (HCC) Inspiratory and Expiratory wheezes, change duonebs to scheulde and reevaluate. No increased WOB. Ambulated with PT/OT, 86% RA, 92% on 2L O2, recommending home O2.  - IV ceftriaxone  (11/22-), azithromycin  IV (11/22-) - PO Prednisone  for 4 days(11/23 - 11/26) - Pain: Tylenol  PRN - Continue home Incruse - DuoNebs every 4 hours scheduled - PT/OT Adrenal incidentaloma CT abdomen showed 4.8 x 3.6 x 6.8 cm right adrenal mass with diffuse low attenuation with features most consistent with a benign adrenal adenoma, studies recommended to exclude function adenoma given size.  - Metanephrines - F/u CT in 3 to 6 months  Chronic health problem OSA: CPAP Anxiety/depression: Continue home sertraline /buspirone    FEN/GI: Regular Diet PPx: Lovenox  Dispo:Med-Surg  Subjective:  Found sitting in bed with no increased WOB. No complaints  Objective: Temp:  [97.6 F (36.4 C)-98.6 F (37 C)] 97.6 F (36.4 C) (11/24 0410) Pulse Rate:  [54-68] 54 (11/24 0410) Resp:  [19-20] 19  (11/24 0410) BP: (123-141)/(63-66) 123/66 (11/24 0410) SpO2:  [94 %-95 %] 95 % (11/24 0410)  Physical Exam: General: Well-appearing, no acute distress Cardio: Regular rate, regular rhythm, no murmurs on exam. Pulm: Wheezing throughout ling fields. No increased work of breathing Abdominal: bowel sounds present, soft, non-tender, non-distended Extremities: no peripheral edema   Laboratory: Most recent CBC Lab Results  Component Value Date   WBC 7.9 09/18/2024   HGB 13.5 09/18/2024   HCT 41.1 09/18/2024   MCV 83.5 09/18/2024   PLT 218 09/18/2024   Most recent BMP    Latest Ref Rng & Units 09/19/2024    4:30 AM  BMP  Glucose 70 - 99 mg/dL 884   BUN 8 - 23 mg/dL 23   Creatinine 9.55 - 1.00 mg/dL 9.03   Sodium 864 - 854 mmol/L 138   Potassium 3.5 - 5.1 mmol/L 4.2   Chloride 98 - 111 mmol/L 103   CO2 22 - 32 mmol/L 29   Calcium 8.9 - 10.3 mg/dL 8.5    Imaging:  CT Abdomen: 4.8 x 3.6 x 6.8 cm right adrenal mass with diffuse low attenuation and scattered foci of macroscopic fat attenuation. Imaging features are most consistent with a benign adrenal adenoma. Biomedical studied recommended to exclude function adenoma given size.   Elodie Palma, MD 09/19/2024, 8:04 AM  PGY-1, [facility] FPTS Intern pager: [phone], text pages welcome Secure chat group [facility] Teaching Service

## 2024-09-19 NOTE — Assessment & Plan Note (Addendum)
 CT abdomen showed 4.8 x 3.6 x 6.8 cm right adrenal mass with diffuse low attenuation with features most consistent with a benign adrenal adenoma, studies recommended to exclude function adenoma given size.  - Metanephrines -- F/u CT in 3 to 6 months

## 2024-09-19 NOTE — TOC CM/SW Note (Signed)
 Transition of Care [facility]) - Inpatient Brief Assessment   Patient Details  Name: Kylie Garner MRN: [id number] Date of Birth: [birth year]  Transition of Care [facility]) CM/SW Contact:    Tom-Johnson, Harvest Muskrat, RN Phone Number: 09/19/2024, 3:17 PM   Clinical Narrative:  Patient presented to the ED with worsening Shortness of Breath, Wheezing and found to be Hypoxic. CTA Chest concerning for Multifocal PNA, admitted with Acute Hypoxic Respiratory Failure 2/2 Multifocal PNA and COPD Exacerbation. Has hx of OSA, Depression, Prediabetes, Obesity and Tobacco use. Currently on 2L O2 acute, does not use home O2. Continues on Neb tx, Steroids and Oral abx.   From home with nephew, elsie, has a supportive daughter who lives in [place]. Currently unemployed, on disability. Modified independent, has a rollator at home.  PCP is  Majeed, Camie, DO and uses Psychologist, Forensic at [facility].   Outpatient PT recommended, patient requested [facility], order and referral on AVS. Patient not Medically ready for discharge.  CM will continue to follow as patient progresses with care towards discharge.              Transition of Care Asessment: Insurance and Status: Insurance coverage has been reviewed Patient has primary care physician: Yes Home environment has been reviewed: Yes Prior level of function:: Modified Independent Prior/Current Home Services: No current home services Social Drivers of Health Review: SDOH reviewed no interventions necessary Readmission risk has been reviewed: Yes Transition of care needs: transition of care needs identified, TOC will continue to follow

## 2024-09-19 NOTE — Assessment & Plan Note (Addendum)
 Inspiratory and Expiratory wheezes, change duonebs to scheulde and reevaluate. No increased WOB. Ambulated with PT/OT, 86% RA, 92% on 2L O2, recommending home O2.  - IV ceftriaxone  (11/22-), azithromycin  IV (11/22-) - PO Prednisone  for 4 days(11/23 - 11/26) - Pain: Tylenol  PRN - Continue home Incruse - DuoNebs every 4 hours scheduled - PT/OT

## 2024-09-19 NOTE — Progress Notes (Signed)
 Physical Therapy Treatment Patient Details Name: Kylie Garner MRN: [id number] DOB: [birth year] Today's Date: 09/19/2024   History of Present Illness 63  y/o F presenting to ED on 11/22 with SOB x4 days, CT chest with multifocal PNA. Admitted for acute hypoxic respiratory failure.    PMH includes COPD, CHF, asthma, prediabetes, HTN, anxiety, depression, OSA    PT Comments  Continuing work on functional mobility and activity tolerance;  Overall managing mobility well; no balance deficits noted, and pt uses rollator well at home; she is confident in her ability to manage the step to enter her home; Majority of PT goals met, PT to sign off  Will request for Nursing and Mobility Team to support her in hallway ambulation daily and to check O2 qualifying walks as needed    If plan is discharge home, recommend the following: A little help with walking and/or transfers;A little help with bathing/dressing/bathroom   Can travel by private vehicle        Equipment Recommendations  None recommended by PT    Recommendations for Other Services       Precautions / Restrictions Precautions Precautions: Other (comment) Recall of Precautions/Restrictions: Intact Precaution/Restrictions Comments: watch O2 sats Restrictions Weight Bearing Restrictions Per Provider Order: No     Mobility  Bed Mobility Overal bed mobility: Modified Independent                  Transfers Overall transfer level: Modified independent   Transfers: Sit to/from Stand Sit to Stand: Modified independent (Device/Increase time)           General transfer comment: No difficulty    Ambulation/Gait Ambulation/Gait assistance: Modified independent (Device/Increase time) Gait Distance (Feet): 150 Feet Assistive device: Rolling walker (2 wheels) Gait Pattern/deviations: Step-through pattern       General Gait Details: Cues to self-monitor for activity tolerance; O2 sat drop on room air   Stairs              Wheelchair Mobility     Tilt Bed    Modified Rankin (Stroke Patients Only)       Balance     Sitting balance-Leahy Scale: Good       Standing balance-Leahy Scale: Fair                              Hotel Manager: No apparent difficulties  Cognition Arousal: Alert Behavior During Therapy: WFL for tasks assessed/performed                             Following commands: Intact      Cueing Cueing Techniques: Verbal cues  Exercises      General Comments General comments (skin integrity, edema, etc.): see other PT note of this date for O2 sat walk documentation      Pertinent Vitals/Pain Pain Assessment Pain Assessment: No/denies pain    Home Living                          Prior Function            PT Goals (current goals can now be found in the care plan section) Acute Rehab PT Goals Patient Stated Goal: Move well enough to be able to dc home Progress towards PT goals: Goals met/education completed, patient discharged from PT (majority of goals met; Nursing and/or Mobility)  Frequency    Min 2X/week      PT Plan      Co-evaluation              AM-PAC PT 6 Clicks Mobility   Outcome Measure  Help needed turning from your back to your side while in a flat bed without using bedrails?: None Help needed moving from lying on your back to sitting on the side of a flat bed without using bedrails?: None Help needed moving to and from a bed to a chair (including a wheelchair)?: None Help needed standing up from a chair using your arms (e.g., wheelchair or bedside chair)?: None Help needed to walk in hospital room?: None Help needed climbing 3-5 steps with a railing? : A Little 6 Click Score: 23    End of Session Equipment Utilized During Treatment: Oxygen Activity Tolerance: Patient tolerated treatment well Patient left: Other (comment) (Managing independently in her  room) Nurse Communication: Mobility status PT Visit Diagnosis: Other abnormalities of gait and mobility (R26.89)     Time: 9046-8994 PT Time Calculation (min) (ACUTE ONLY): 12 min  Charges:    $Gait Training: 8-22 mins PT General Charges $$ ACUTE PT VISIT: 1 Visit                     Silvano Currier, PT  Acute Rehabilitation Services Office [phone] Secure Chat welcomed    Silvano VEAR Currier 09/19/2024, 12:49 PM

## 2024-09-19 NOTE — Assessment & Plan Note (Signed)
 OSA: CPAP Anxiety/depression: Continue home sertraline /buspirone 

## 2024-09-20 DIAGNOSIS — R197 Diarrhea, unspecified: Secondary | ICD-10-CM | POA: Insufficient documentation

## 2024-09-20 DIAGNOSIS — J9601 Acute respiratory failure with hypoxia: Secondary | ICD-10-CM | POA: Diagnosis not present

## 2024-09-20 MED ORDER — IPRATROPIUM-ALBUTEROL 0.5-2.5 (3) MG/3ML IN SOLN
3.0000 mL | Freq: Four times a day (QID) | RESPIRATORY_TRACT | Status: DC
  Administered 2024-09-20 – 2024-09-21 (×2): 3 mL via RESPIRATORY_TRACT
  Filled 2024-09-20 (×3): qty 3

## 2024-09-20 MED ORDER — IPRATROPIUM-ALBUTEROL 0.5-2.5 (3) MG/3ML IN SOLN: 3.0000 mL | Freq: Four times a day (QID) | RESPIRATORY_TRACT | Status: DC

## 2024-09-20 MED ORDER — IPRATROPIUM-ALBUTEROL 0.5-2.5 (3) MG/3ML IN SOLN: 3.0000 mL | RESPIRATORY_TRACT | Status: DC

## 2024-09-20 MED ORDER — IPRATROPIUM-ALBUTEROL 0.5-2.5 (3) MG/3ML IN SOLN: 3.0000 mL | RESPIRATORY_TRACT | Status: DC | PRN

## 2024-09-20 MED ORDER — GERHARDT'S BUTT CREAM
TOPICAL_CREAM | Freq: Every day | CUTANEOUS | Status: DC
  Filled 2024-09-20: qty 60

## 2024-09-20 MED ORDER — LOPERAMIDE HCL 2 MG PO CAPS
2.0000 mg | ORAL_CAPSULE | Freq: Once | ORAL | Status: AC
  Administered 2024-09-20: 2 mg via ORAL
  Filled 2024-09-20: qty 1

## 2024-09-20 NOTE — Plan of Care (Signed)
  Problem: Health Behavior/Discharge Planning: Goal: Ability to manage health-related needs will improve Outcome: Progressing   Problem: Clinical Measurements: Goal: Respiratory complications will improve Outcome: Progressing   Problem: Activity: Goal: Risk for activity intolerance will decrease Outcome: Progressing   

## 2024-09-20 NOTE — Assessment & Plan Note (Signed)
 CPAP ordered as PRN, will f/u with nurse if using. Recommended updated sleep study at outpatient - Update order to CPAP scheduled at bedtime

## 2024-09-20 NOTE — Assessment & Plan Note (Addendum)
 DuoNebs scheduled q4, RT canceled order yesterday, patient last received 11/24 at 2200. Signifigant wheezing continued on exam with no increased WOB, though patient feeling worse today with increased cough with copius green sputum - IV ceftriaxone  (11/22-), azithromycin  IV (11/22-) - PO Prednisone  for 4 days(11/23 - 11/26) -- DuoNebs scheduled q6 hours - Pain: Tylenol  PRN - Continue home Incruse

## 2024-09-20 NOTE — Progress Notes (Signed)
 Mobility Specialist: Progress Note   09/20/24 1400  Mobility  Activity Ambulated independently  Level of Assistance Modified independent, requires aide device or extra time  Assistive Device Front wheel walker  Distance Ambulated (ft) 300 ft  Activity Response Tolerated well  Mobility Referral Yes  Mobility visit 1 Mobility  Mobility Specialist Start Time (ACUTE ONLY) 1145  Mobility Specialist Stop Time (ACUTE ONLY) 1155  Mobility Specialist Time Calculation (min) (ACUTE ONLY) 10 min    Pt received in bed, agreeable to mobility session. ModI throughout w/ RW. Completed ambulatory sat test (see following note). Feeling SOB, denies dizziness or lightheadedness. Returned to room. Left on EOB on 2L with all needs met, call bell in reach.   Ileana Lute Mobility Specialist Please contact via SecureChat or Rehab office at [phone]

## 2024-09-20 NOTE — Progress Notes (Signed)
 Nurse requested Mobility Specialist to perform oxygen saturation test with pt which includes removing pt from oxygen both at rest and while ambulating.  Below are the results from that testing.     Patient Saturations on Room Air at Rest = spO2 92%  Patient Saturations on Room Air while Ambulating = sp02 81-90% .    Patient Saturations on 2 Liters of oxygen while Ambulating = sp02 91-93%  At end of testing pt left in room on 2  Liters of oxygen.  Reported results to nurse.

## 2024-09-20 NOTE — Assessment & Plan Note (Signed)
 CT abdomen showed 4.8 x 3.6 x 6.8 cm right adrenal mass with diffuse low attenuation with features most consistent with a benign adrenal adenoma, studies recommended to exclude function adenoma given size.  - Metanephrines -- F/u CT in 3 to 6 months

## 2024-09-20 NOTE — Assessment & Plan Note (Signed)
 Increased loose stools on Ab - Imodium  2mg  once - Continue to monitor

## 2024-09-20 NOTE — Progress Notes (Signed)
     Daily Progress Note Intern Pager: [phone]  Patient name: Kylie Garner Medical record number: [id number] Date of birth: 12-26-1960 Age: 63 y.o. 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  Patient continues to be on [place] with no increased WOB the physical exam findings significant inspiratory and expiratory wheezing continued, before DuoNeb scheduled was discontinued by RT with last DuoNebs at 8 PM the previous night.  Will send a message to nursing to prevent this from happening.   PT recommeneds outpatient followup Assessment & Plan Acute hypoxic respiratory failure (HCC) Multifocal pneumonia COPD exacerbation (HCC) DuoNebs scheduled q4, RT canceled order yesterday, patient last received 11/24 at 2200. Signifigant wheezing continued on exam with no increased WOB, though patient feeling worse today with increased cough with copius green sputum - IV ceftriaxone  (11/22-), azithromycin  IV (11/22-) - PO Prednisone  for 4 days(11/23 - 11/26) -- DuoNebs scheduled q6 hours - Pain: Tylenol  PRN - Continue home Incruse Adrenal incidentaloma CT abdomen showed 4.8 x 3.6 x 6.8 cm right adrenal mass with diffuse low attenuation with features most consistent with a benign adrenal adenoma, studies recommended to exclude function adenoma given size.  - Metanephrines -- F/u CT in 3 to 6 months  Diarrhea Increased loose stools on Ab - Imodium  2mg  once - Continue to monitor OSA on CPAP CPAP ordered as PRN, will f/u with nurse if using. Recommended updated sleep study at outpatient - Update order to CPAP scheduled at bedtime  Chronic health  problem Anxiety/depression: Continue home sertraline /buspirone    FEN/GI: Regular diet PPx: Lovenox  Dispo: MedSurg, potential discharge today  Subjective:  Found sitting up comfortably in bed. Complains of increased difficulty breathing with cough that is causing abdominal pain, increased frothy green sputum production.   Objective: Temp:  [97.7 F (36.5 C)-98.4 F (36.9 C)] 98.4 F (36.9 C) (11/25 0715) Pulse Rate:  [55-65] 55 (11/25 0715) Resp:  [18-19] 18 (11/25 0715) BP: (94-120)/(36-67) 103/52 (11/25 0715) SpO2:  [95 %-98 %] 98 % (11/25 0715)  Physical Exam: General: Well-appearing, no acute distress on [place] Cardio: Regular rate, regular rhythm, no murmurs on exam. Pulm: Wheezing throughout lung fields. No increased work of breathing Abdominal: Soft, non-tender, non-distended Extremities: no peripheral edema   Laboratory: Most recent CBC Lab Results  Component Value Date   WBC 7.9 09/18/2024   HGB 13.5 09/18/2024   HCT 41.1 09/18/2024   MCV 83.5 09/18/2024   PLT 218 09/18/2024   Most recent BMP    Latest Ref Rng & Units 09/19/2024    4:30 AM  BMP  Glucose 70 - 99 mg/dL 884   BUN 8 - 23 mg/dL 23   Creatinine 9.55 - 1.00 mg/dL 9.03   Sodium 864 - 854 mmol/L 138   Potassium 3.5 - 5.1 mmol/L 4.2   Chloride 98 - 111 mmol/L 103   CO2 22 - 32 mmol/L 29   Calcium 8.9 - 10.3 mg/dL 8.5    Elodie Palma, MD 09/20/2024, 7:45 AM  PGY-1, [facility] FPTS Intern pager: [phone], text pages welcome Secure chat group [facility] Teaching Service

## 2024-09-20 NOTE — Assessment & Plan Note (Addendum)
 Anxiety/depression: Continue home sertraline /buspirone 

## 2024-09-21 ENCOUNTER — Other Ambulatory Visit (HOSPITAL_COMMUNITY): Payer: Self-pay

## 2024-09-21 DIAGNOSIS — J9601 Acute respiratory failure with hypoxia: Secondary | ICD-10-CM | POA: Diagnosis not present

## 2024-09-21 MED ORDER — IPRATROPIUM-ALBUTEROL 0.5-2.5 (3) MG/3ML IN SOLN: 3.0000 mL | Freq: Four times a day (QID) | RESPIRATORY_TRACT | Status: DC | PRN

## 2024-09-21 MED ORDER — UMECLIDINIUM-VILANTEROL 62.5-25 MCG/ACT IN AEPB
1.0000 | INHALATION_SPRAY | Freq: Every day | RESPIRATORY_TRACT | 0 refills | Status: AC
  Filled 2024-09-21: qty 60, 30d supply, fill #0

## 2024-09-21 MED ORDER — GUAIFENESIN ER 600 MG PO TB12
600.0000 mg | ORAL_TABLET | Freq: Two times a day (BID) | ORAL | Status: DC
  Administered 2024-09-21 – 2024-09-23 (×5): 600 mg via ORAL
  Filled 2024-09-21 (×5): qty 1

## 2024-09-21 NOTE — Assessment & Plan Note (Deleted)
 Increased loose stools on Abx *** - Imodium  2mg  once - Continue to monitor

## 2024-09-21 NOTE — Assessment & Plan Note (Signed)
 CT abdomen showed 4.8 x 3.6 x 6.8 cm right adrenal mass with diffuse low attenuation with features most consistent with a benign adrenal adenoma, studies recommended to exclude function adenoma given size.  - Metanephrines -- F/u CT in 3 to 6 months

## 2024-09-21 NOTE — Assessment & Plan Note (Addendum)
 DuoNebs scheduled q6, patient's respiratory status continues to improve.  - IV ceftriaxone  (11/22-11/26), azithromycin  IV (11/22-11/26) - PO Prednisone  for 4 days(11/23 - 11/26) -- DuoNebs scheduled q6 hours -- Mucinex  600 mg BID for congestions - Pain: Tylenol  PRN - Continue home Incruse

## 2024-09-21 NOTE — Progress Notes (Signed)
 Occupational Therapy Treatment Patient Details Name: Kylie Garner MRN: [id number] DOB: [birth year] Today's Date: 09/21/2024   History of present illness 63  y/o F presenting to ED on 11/22 with SOB x4 days, CT chest with multifocal PNA. Admitted for acute hypoxic respiratory failure.    PMH includes COPD, CHF, asthma, prediabetes, HTN, anxiety, depression, OSA   OT comments  Pt progressing towards goals, demo's improved activity tolerance with hallway distance ambulation. Pt needing x2 standing rest breaks and noted to be tripoding on forearms with 1/4 DOE. Pt SpO2 down to 86% on RA, incr to 93% on 2L O2 with ambulation, pt verbalized understanding of energy conservation strategies for home. Pt presenting with impairments listed below, will follow acutely. Continue to anticipate no OT follow up needs at d/c.      If plan is discharge home, recommend the following:  A little help with bathing/dressing/bathroom;A little help with walking and/or transfers;Assistance with cooking/housework;Assist for transportation;Help with stairs or ramp for entrance   Equipment Recommendations  Tub/shower seat    Recommendations for Other Services PT consult    Precautions / Restrictions Precautions Precautions: Other (comment) Recall of Precautions/Restrictions: Intact Precaution/Restrictions Comments: watch O2 sats Restrictions Weight Bearing Restrictions Per Provider Order: No       Mobility Bed Mobility Overal bed mobility: Independent                  Transfers Overall transfer level: Modified independent Equipment used: Rolling walker (2 wheels) Transfers: Sit to/from Stand Sit to Stand: Modified independent (Device/Increase time)                 Balance Overall balance assessment: Needs assistance   Sitting balance-Leahy Scale: Good     Standing balance support: During functional activity, Reliant on assistive device for balance Standing balance-Leahy Scale:  Fair Standing balance comment: ambulating short distances without AD                           ADL either performed or assessed with clinical judgement   ADL Overall ADL's : Needs assistance/impaired                         Toilet Transfer: Modified Independent;Ambulation;Rolling walker (2 wheels)           Functional mobility during ADLs: Modified independent;Rolling walker (2 wheels)      Extremity/Trunk Assessment Upper Extremity Assessment Upper Extremity Assessment: Generalized weakness   Lower Extremity Assessment Lower Extremity Assessment: Defer to PT evaluation        Vision   Vision Assessment?: No apparent visual deficits   Perception Perception Perception: Not tested   Praxis Praxis Praxis: Not tested   Communication Communication Communication: No apparent difficulties   Cognition Arousal: Alert Behavior During Therapy: WFL for tasks assessed/performed Cognition: No apparent impairments                               Following commands: Intact        Cueing   Cueing Techniques: Verbal cues  Exercises      Shoulder Instructions       General Comments SpO2 down to 86 % on RA, 2L O2 wtih SPO2 93%    Pertinent Vitals/ Pain       Pain Assessment Pain Assessment: No/denies pain  Home Living  Prior Functioning/Environment              Frequency  Min 2X/week        Progress Toward Goals  OT Goals(current goals can now be found in the care plan section)  Progress towards OT goals: Progressing toward goals  Acute Rehab OT Goals Patient Stated Goal: none stated OT Goal Formulation: With patient Time For Goal Achievement: 10/02/24 Potential to Achieve Goals: Good ADL Goals Pt Will Perform Upper Body Dressing: with modified independence;sitting Pt Will Perform Lower Body Dressing: with modified independence;sitting/lateral leans;sit  to/from stand Pt Will Transfer to Toilet: with modified independence;ambulating;regular height toilet Additional ADL Goal #1: pt will verbalize 3 energy conservation strategies in prep for ADLs  Plan      Co-evaluation                 AM-PAC OT 6 Clicks Daily Activity     Outcome Measure   Help from another person eating meals?: None Help from another person taking care of personal grooming?: None Help from another person toileting, which includes using toliet, bedpan, or urinal?: None Help from another person bathing (including washing, rinsing, drying)?: A Little Help from another person to put on and taking off regular upper body clothing?: None Help from another person to put on and taking off regular lower body clothing?: A Little 6 Click Score: 22    End of Session Equipment Utilized During Treatment: Rolling walker (2 wheels);Oxygen (2L)  OT Visit Diagnosis: Unsteadiness on feet (R26.81);Other abnormalities of gait and mobility (R26.89);Muscle weakness (generalized) (M62.81)   Activity Tolerance Patient tolerated treatment well   Patient Left in bed;with call bell/phone within reach   Nurse Communication Mobility status        Time: 9261-9244 OT Time Calculation (min): 17 min  Charges: OT General Charges $OT Visit: 1 Visit OT Treatments $Therapeutic Activity: 8-22 mins  Caitlinn Klinker K, OTD, OTR/L SecureChat Preferred Acute Rehab [phone]   Laneta POUR Koonce 09/21/2024, 8:18 AM

## 2024-09-21 NOTE — Assessment & Plan Note (Signed)
 Anxiety/depression: Continue home sertraline /buspirone 

## 2024-09-21 NOTE — Discharge Instructions (Addendum)
 Dear Kylie Garner,  Thank you for letting us  participate in your care. You were hospitalized for shortness of breath, coughing, and producing more mucus than usual and diagnosed with Acute hypoxic respiratory failure (HCC). You were treated with IV antibiotics for pneumonia, steroids to help open the airways, breathing treatments, and oxygen therapy. Your breathing improved during your stay. Your oxygen levels are good when resting, but you did need extra oxygen when walking with therapy. Your care team evaluated you for home oxygen and you will go home with oxygen if recommended per the ambulatory oxygen study.  Additionally, as adrenal mass (a growth above the kidney) was found on your CT scan. This appears most consistent with a benign adrenal adenoma, meaning it is likely not cancer. However, because of its size, more tests are needed to make sure it is not producing hormones. We ordered blood/urine tests called metanephrines. Please follow up with your PCP for any additional testing they recommend.   Other problems managed this admission included anxiety, depression, and sleep apnea.   MEDICATION CHANGES - None  POST-HOSPITAL & CARE INSTRUCTIONS Please call to arrange follow up with your primary care physician in 1 week.  Go to your follow up appointments (listed below)   DOCTOR'S APPOINTMENT   Future Appointments  Date Time Provider Department Center  10/04/2024  9:00 AM Dohmeier, Dedra, MD [facility] None  10/06/2024  9:30 AM Lupie Camie HAS [facility]    Follow-up Information     [facility] at [facility] Follow up.   Specialty: Rehabilitation Why: Call to schedule your first appointment. Contact information: [address] [place]  [phone]                Take care and be well!  Family Medicine Teaching Service Inpatient Team [place]  [facility]  [address]  [place], [place] [address] [phone]

## 2024-09-21 NOTE — Progress Notes (Signed)
 RN gave patient her TOC medication for home since [facility] will be closed tomorrow. Medication is at bedside.

## 2024-09-21 NOTE — Plan of Care (Signed)
  Problem: Skin Integrity: Goal: Risk for impaired skin integrity will decrease Outcome: Progressing   Problem: Health Behavior/Discharge Planning: Goal: Ability to manage health-related needs will improve Outcome: Progressing   

## 2024-09-21 NOTE — Assessment & Plan Note (Addendum)
 CPAP ordered, per nursing RT never came overnight to set up CPAP.  - F/u with RT on CPAP  - Ensure CPAP use tonight

## 2024-09-21 NOTE — Progress Notes (Signed)
     Daily Progress Note Intern Pager: [phone]  Patient name: Kylie Garner Medical record number: [id number] Date of birth: [birth year] Age: 63 y.o. 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  Patient continues to be on [place] 0.5 with no increased WOB with improvement in wheezing. Starting mucinex  for congestion. Ambulatory O2 tomorrow with potential discharge in the morning. All antibiotics should be finished today.  Assessment & Plan Acute hypoxic respiratory failure (HCC) Multifocal pneumonia COPD exacerbation (HCC) DuoNebs scheduled q6, patient's respiratory status continues to improve.  - IV ceftriaxone  (11/22-11/26), azithromycin  IV (11/22-11/26) - PO Prednisone  for 4 days(11/23 - 11/26) -- DuoNebs scheduled q6 hours -- Mucinex  600 mg BID for congestions - Pain: Tylenol  PRN - Continue home Incruse Adrenal incidentaloma CT abdomen showed 4.8 x 3.6 x 6.8 cm right adrenal mass with diffuse low attenuation with features most consistent with a benign adrenal adenoma, studies recommended to exclude function adenoma given size.  - Metanephrines -- F/u CT in 3 to 6 months  OSA on CPAP CPAP ordered, per nursing RT never came overnight to set up CPAP.  - F/u with RT on CPAP  - Ensure CPAP use tonight  Chronic health problem Anxiety/depression: Continue home sertraline /buspirone    FEN/GI: Regular diet PPx: Lovenox  Dispo: MedSurg  Subjective:  Patient found sitting up in bed in no acute distress.  Patient requested Mucinex  as she feels severely congested and feels as though this will help her improvement.  Patient also requested team to contact Dr.  Peter Koval for an update on 3 weeks without cigarettes with no withdrawal symptoms.   Objective: Temp:  [97.6 F (36.4 C)-98.2 F (36.8 C)] 97.6 F (36.4 C) (11/26 0455) Pulse Rate:  [52-72] 65 (11/26 0455) Resp:  [16-19] 16 (11/26 0455) BP: (116-122)/(52-69) 119/64 (11/26 0455) SpO2:  [6 %-97 %] 97 % (11/26 0455)  Physical Exam: General: Well-appearing, no acute distress Cardio: Regular rate, regular rhythm, no murmurs on exam. Pulm: No increased work of breathing with diffuse wheezing throughout lung fields Abdominal: bowel sounds present, soft, non-tender, non-distended Extremities: no peripheral edema   Laboratory: Most recent CBC Lab Results  Component Value Date   WBC 7.9 09/18/2024   HGB 13.5 09/18/2024   HCT 41.1 09/18/2024   MCV 83.5 09/18/2024   PLT 218 09/18/2024   Most recent BMP    Latest Ref Rng & Units 09/19/2024    4:30 AM  BMP  Glucose 70 - 99 mg/dL 884   BUN 8 - 23 mg/dL 23   Creatinine 9.55 - 1.00 mg/dL 9.03   Sodium 864 - 854 mmol/L 138   Potassium 3.5 - 5.1 mmol/L 4.2   Chloride 98 - 111 mmol/L 103   CO2 22 - 32 mmol/L 29   Calcium 8.9 - 10.3 mg/dL 8.5     Elodie Palma, MD 09/21/2024, 7:56 AM  PGY-1, [place] [facility] FPTS Intern pager: [phone], text pages welcome Secure chat group [facility] Teaching Service

## 2024-09-22 DIAGNOSIS — J9601 Acute respiratory failure with hypoxia: Secondary | ICD-10-CM | POA: Diagnosis not present

## 2024-09-22 NOTE — Assessment & Plan Note (Deleted)
 CPAP ordered, per nursing RT never came overnight to set up CPAP.  - F/u with RT on CPAP  - Ensure CPAP use tonight

## 2024-09-22 NOTE — Progress Notes (Signed)
 SATURATION QUALIFICATIONS: (This note is used to comply with regulatory documentation for home oxygen)  Patient Saturations on Room Air at Rest = 90%  Patient Saturations on Room Air while Ambulating = 86%  Patient Saturations on 2 Liters of oxygen while Ambulating = 91%  Please briefly explain why patient needs home oxygen: Pt walks independently, O2 saturation drops to 86% while ambulating, with minimal difficulty of breathing. Started O2 at 2L, O2 saturation stays between 90-91%.

## 2024-09-22 NOTE — Progress Notes (Addendum)
 SATURATION QUALIFICATIONS: (This note is used to comply with regulatory documentation for home oxygen)  Patient Saturations on Room Air at Rest = 90%  Patient Saturations on Room Air while Ambulating = 86%  Patient Saturations on 2 Liters of oxygen while Ambulating = 91%  Please briefly explain why patient needs home oxygen: Pt walks independently, O2 saturation drops to 86% while ambulating, with minimal difficulty of breathing. Started O2 at 2L, O2 saturation stays between 90-91%.   Called RT to make sure pt will have CPAP tonight.

## 2024-09-22 NOTE — Assessment & Plan Note (Deleted)
 CT abdomen showed 4.8 x 3.6 x 6.8 cm right adrenal mass with diffuse low attenuation with features most consistent with a benign adrenal adenoma, studies recommended to exclude function adenoma given size.  - Metanephrines -- F/u CT in 3 to 6 months

## 2024-09-22 NOTE — Assessment & Plan Note (Addendum)
 Anxiety/depression: Continue home sertraline /buspirone  Adrenal incidentaloma: CT abdomen-4.8 x 3.6 x 6.8 cm right adrenal mass with diffuse low-attenuation most consistent with a benign adrenal adenoma.  Follow-up CT in 3 to 6 months.

## 2024-09-22 NOTE — Assessment & Plan Note (Addendum)
 DuoNebs scheduled q6h, patient's respiratory status continues to improve however remains on 2 L [place] O2 when she is not on any at home.  - IV ceftriaxone  (11/22-11/26), azithromycin  IV (11/22-11/26) - PO Prednisone  for 4 days (11/23 - 11/26) -- DuoNebs scheduled q6 hours -- Mucinex  600 mg BID for congestions - Pain: Tylenol  PRN - Continue home Incruse - Plan for ambulatory test again today and continuing to wean off nasal cannula if possible; may have to go home on [place] O2 if patient cannot wean off O2

## 2024-09-22 NOTE — Discharge Summary (Addendum)
 Family Medicine Teaching [facility] Discharge Summary  Patient name: Kylie Garner Medical record number: [id number] Date of birth: June 24, 1961 Age: 63 y.o. Gender: female Date of Admission: 09/17/2024  Date of Discharge: 09/23/24 Admitting Physician: Gladis Church, DO  Primary Care Provider: Lupie Credit, DO Consultants: None  Indication for Hospitalization: Acute hypoxic respiratory failure and Adrenal incidentaloma  Brief Hospital Course:  Kylie Garner is a 63 y.o.female with a history of COPD, HTN, OSA who was admitted to the [facility] Medicine Teaching Service at [facility] for acute hypoxic respiratory failure. Her hospital course is detailed below:  Acute hypoxic respiratory failure Workup suggestive of multifocal pneumonia and COPD exacerbation. Started on IV ceftriaxone  and azithromycin  (11/22- 11/26). Responded well to PRN duonebs and steroid course. Weaned to room air. She improved steadily with decreased wheezing and improved oxygenation. Ambulatory O? testing was performed x2 and patient was found to need 2L supplemental O2 at discharge. She was discharged home in stable condition with oxygen as needed for ambulation.   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 Given size, further endocrine evaluation is recommended. Serum/urine metanephrines were normal. Patient discharged with recommendation for repeat imaging (CT abdomen) in 3-6 months.  Other chronic conditions were medically managed with home medications and formulary alternatives as necessary (OSA, Anxiety, MDD, and Tobacco use)  PCP Follow-up Recommendations: Repeat adrenal CT in 3-6 months Review metanephrine results when available Referral for sleep study for updated CPAP evaluation   Disposition: Home  Discharge Condition: Stable   Discharge Exam:  Physical Exam: General:  Well-appearing, interactive, smiling, no acute distress Cardiovascular: RRR, no M//G Respiratory: Good respiratory effort, good air movement throughout lung fields, faint wheezes throughout Abdomen: Soft, nontender, nondistended   Issues for Follow Up:  Follow up with respiratory status. CT chest accidentally finding include Pneumonia, however patient is asymptomatic and no treatment was required  Significant Procedures: None   Results/Tests Pending at Time of Discharge: None  Discharge Medications:  Allergies as of 09/23/2024   No Known Allergies      Medication List     STOP taking these medications    Incruse Ellipta  62.5 MCG/ACT Aepb Generic drug: umeclidinium bromide        TAKE these medications    acidophilus Caps capsule Take 2 capsules by mouth 3 (three) times daily.   albuterol  108 (90 Base) MCG/ACT inhaler Commonly known as: ProAir  HFA Inhale 1-2 puffs into the lungs every 6 (six) hours as needed for wheezing or shortness of breath.   Anoro Ellipta  62.5-25 MCG/ACT Aepb Generic drug: umeclidinium-vilanterol Inhale 1 puff into the lungs daily.   busPIRone  10 MG tablet Commonly known as: BUSPAR  Take 1 tablet (10 mg total) by mouth 2 (two) times daily. Patient needs to make appointment to be seen by PCP.   Gerhardt's butt cream Crea Apply 1 Application topically daily.   guaiFENesin  600 MG 12 hr tablet Commonly known as: MUCINEX  Take 1 tablet (600 mg total) by mouth 2 (two) times daily.   Ibuprofen 200 MG Caps Take 600 mg by mouth daily as needed (for pain).   lidocaine  5 % Commonly known as: LIDODERM  Place 1 patch onto the skin daily. Remove & Discard patch within 12 hours or as directed by MD   sertraline  100 MG tablet Commonly known as: ZOLOFT  Take 1 tablet (100 mg total) by mouth daily. What changed: when  to take this               Durable Medical Equipment  (From admission, onward)           Start     Ordered   09/22/24  1148  For home use only DME oxygen  Once       Question Answer Comment  Length of Need 6 Months   Mode or (Route) Nasal cannula   Liters per Minute 2   Frequency Continuous (stationary and portable oxygen unit needed)   Oxygen delivery system: Gas   Oxygen delivery system: Portable concentrator (POC)      09/22/24 1149                  Discharge Instructions: Please refer to Patient Instructions section of EMR for full details.  Patient was counseled important signs and symptoms that should prompt return to medical care, changes in medications, dietary instructions, activity restrictions, and follow up appointments.   Follow-Up Appointments:  Follow-up Information     [place] [facility] Follow up.   Specialty: Rehabilitation Why: Call to schedule your first appointment. Contact information: [address] [place]  [address] [phone]                Kylie Perkins, DO 09/23/2024, 8:45 AM PGY-3, [facility] Family Medicine

## 2024-09-22 NOTE — Progress Notes (Signed)
 Mobility Specialist: Progress Note   09/22/24 1200  Mobility  Activity Ambulated independently  Level of Assistance Modified independent, requires aide device or extra time  Assistive Device Front wheel walker  Distance Ambulated (ft) 400 ft  Activity Response Tolerated well  Mobility Referral Yes  Mobility visit 1 Mobility  Mobility Specialist Start Time (ACUTE ONLY) 1000  Mobility Specialist Stop Time (ACUTE ONLY) 1014  Mobility Specialist Time Calculation (min) (ACUTE ONLY) 14 min    Pt received in chair, agreeable to mobility session. ModI throughout. SpO2 83-90% on RA, >90% on 2LO2. No complaints. Returned to room. Left on EOB with all needs met, call bell in reach.   Ileana Lute Mobility Specialist Please contact via SecureChat or Rehab office at [phone]

## 2024-09-22 NOTE — TOC Transition Note (Signed)
 Transition of Care [facility]) - Discharge Note   Patient Details  Name: Kylie Garner MRN: [id number] Date of Birth: April 04, 1961  Transition of Care [facility]) CM/SW Contact:  Tom-Johnson, Harvest Muskrat, RN Phone Number: 09/22/2024, 1:56 PM   Clinical Narrative:     Home O2 order called in to [employer] and [employer] to deliver portable tank to patient at bedside.  CM will continue to follow as patient progresses with care towards discharge.        Patient Goals and CMS Choice            Discharge Placement                       Discharge Plan and Services Additional resources added to the After Visit Summary for                                       Social Drivers of Health (SDOH) Interventions SDOH Screenings   Food Insecurity: No Food Insecurity (09/18/2024)  Housing: Low Risk  (09/18/2024)  Transportation Needs: No Transportation Needs (09/18/2024)  Utilities: Not At Risk (09/18/2024)  Depression (PHQ2-9): High Risk (08/23/2024)  Tobacco Use: High Risk (09/17/2024)     Readmission Risk Interventions    09/19/2024    3:16 PM  Readmission Risk Prevention Plan  Post Dischage Appt Complete  Medication Screening Complete  Transportation Screening Complete

## 2024-09-22 NOTE — Progress Notes (Signed)
     Daily Progress Note Intern Pager: [phone]  Patient name: Kylie Garner Medical record number: [id number] Date of birth: Sep 17, 1961 Age: 63 y.o. Gender: female  Primary Care Provider: Lupie Credit, DO Consultants: None Code Status: Full  Pt Overview and Major Events to Date:  11/22: Admitted for acute hypoxic respiratory failure and adrenal incidentaloma  Kylie Garner is a 63 year old female with past medical history of HLD, COPD, OSA, anxiety, and new adrenal incidentaloma found on recent imaging and was admitted for acute hypoxic respiratory failure.  Hospital course has been complicated by DOE and failure to remain off oxygen during ambulatory tests.  All antibiotic and steroid courses have been completed.  Will try ambulatory test again today to see if patient can tolerate.  Most likely discharge pending tomorrow. Assessment & Plan Acute hypoxic respiratory failure (HCC) Multifocal pneumonia COPD exacerbation (HCC) DuoNebs scheduled q6h, patient's respiratory status continues to improve however remains on 2 L [place] O2 when she is not on any at home.  - IV ceftriaxone  (11/22-11/26), azithromycin  IV (11/22-11/26) - PO Prednisone  for 4 days (11/23 - 11/26) -- DuoNebs scheduled q6 hours -- Mucinex  600 mg BID for congestions - Pain: Tylenol  PRN - Continue home Incruse - Plan for ambulatory test again today and continuing to wean off nasal cannula if possible; may have to go home on [place] O2 if patient cannot wean off O2 OSA on CPAP Has not been receiving for the last 2 nights due to ?miscommunication with RT/nursing. - Attempt CPAP again tonight  Chronic health problem Anxiety/depression: Continue home sertraline /buspirone  Adrenal incidentaloma: CT abdomen-4.8 x 3.6 x 6.8 cm right adrenal mass with diffuse low-attenuation most consistent with a benign adrenal adenoma.  Follow-up CT in 3 to 6 months.  FEN/GI: Regular diet PPx: Lovenox  Dispo:Home pending clinical improvement .  Barriers include will need for supplemental oxygen.   Subjective:  Patient was seen and examined at bedside.  She is sitting comfortably in the recliner with no increased work of breathing on 2 L nasal cannula.  Objective: Temp:  [97.7 F (36.5 C)-98.4 F (36.9 C)] 98.4 F (36.9 C) (11/27 0518) Pulse Rate:  [58-70] 64 (11/27 0518) Resp:  [18] 18 (11/27 0518) BP: (103-133)/(41-64) 133/62 (11/27 0518) SpO2:  [87 %-94 %] 92 % (11/27 0518) Physical Exam: General: Well-appearing, interactive, smiling, no acute distress Cardiovascular: RRR, no M//G Respiratory: Good respiratory effort, good air movement throughout lung fields, faint wheezes throughout Abdomen: Soft, nontender, nondistended  Laboratory: Most recent CBC Lab Results  Component Value Date   WBC 7.9 09/18/2024   HGB 13.5 09/18/2024   HCT 41.1 09/18/2024   MCV 83.5 09/18/2024   PLT 218 09/18/2024   Most recent BMP    Latest Ref Rng & Units 09/19/2024    4:30 AM  BMP  Glucose 70 - 99 mg/dL 884   BUN 8 - 23 mg/dL 23   Creatinine 9.55 - 1.00 mg/dL 9.03   Sodium 864 - 854 mmol/L 138   Potassium 3.5 - 5.1 mmol/L 4.2   Chloride 98 - 111 mmol/L 103   CO2 22 - 32 mmol/L 29   Calcium 8.9 - 10.3 mg/dL 8.5    Kylie Credit, DO 09/22/2024, 7:29 AM  PGY-1, [place] [facility] FPTS Intern pager: [phone], text pages welcome Secure chat group [facility] Teaching Service

## 2024-09-22 NOTE — Assessment & Plan Note (Signed)
 Has not been receiving for the last 2 nights due to ?miscommunication with RT/nursing. - Attempt CPAP again tonight

## 2024-09-23 ENCOUNTER — Other Ambulatory Visit (HOSPITAL_COMMUNITY): Payer: Self-pay

## 2024-09-23 DIAGNOSIS — J9601 Acute respiratory failure with hypoxia: Secondary | ICD-10-CM | POA: Diagnosis not present

## 2024-09-23 LAB — METANEPHRINES, PLASMA
Metanephrine, Free: <25 pg/mL (ref 0.0–88.0)
Normetanephrine, Free: 89.9 pg/mL (ref 0.0–285.2)

## 2024-09-23 MED ORDER — GERHARDT'S BUTT CREAM: 1.0000 | TOPICAL_CREAM | Freq: Every day | CUTANEOUS | Status: AC

## 2024-09-23 MED ORDER — GUAIFENESIN ER 600 MG PO TB12
600.0000 mg | ORAL_TABLET | Freq: Two times a day (BID) | ORAL | 0 refills | Status: AC
  Filled 2024-09-23: qty 60, 30d supply, fill #0

## 2024-09-23 MED ORDER — LIDOCAINE 5 % EX PTCH: 1.0000 | MEDICATED_PATCH | CUTANEOUS | Status: AC

## 2024-09-23 MED ORDER — ACIDOPHILUS PO CAPS
2.0000 | ORAL_CAPSULE | Freq: Three times a day (TID) | ORAL | 0 refills | Status: AC
  Filled 2024-09-23: qty 180, 30d supply, fill #0

## 2024-09-23 NOTE — TOC Transition Note (Signed)
 Transition of Care [facility]) - Discharge Note   Patient Details  Name: Kylie Garner MRN: [id number] Date of Birth: [birth year]  Transition of Care [facility]) CM/SW Contact:  Tom-Johnson, Harvest Muskrat, RN Phone Number: 09/23/2024, 9:52 AM   Clinical Narrative:     Patient is scheduled for discharge today.  Readmission Risk Assessment done. Outpatient PT referral, hospital f/u and discharge instructions on AVS. Prescriptions sent to [facility] pharmacy and patient will receive meds prior discharge. Tub Bench ordered from [employer] and [employer] to deliver to patient at discharge.  Leonel Fallow to transport at discharge.  No further ICM needs noted.       Final next level of care: OP Rehab Barriers to Discharge: Barriers Resolved   Patient Goals and CMS Choice Patient states their goals for this hospitalization and ongoing recovery are:: To return home CMS Medicare.gov Compare Post Acute Care list provided to:: Patient Choice offered to / list presented to : Patient      Discharge Placement                Patient to be transferred to facility by: Nephew Name of family member notified: [facility] Additional resources added to the After Visit Summary for                  DME Arranged: Tub bench DME Agency: [employer] Date DME Agency Contacted: 09/23/24 Time DME Agency Contacted: [phone] Representative spoke with at DME Agency: [facility] HH Arranged: NA HH Agency: NA        Social Drivers of Health (SDOH) Interventions SDOH Screenings   Food Insecurity: No Food Insecurity (09/18/2024)  Housing: Low Risk  (09/18/2024)  Transportation Needs: No Transportation Needs (09/18/2024)  Utilities: Not At Risk (09/18/2024)  Depression (PHQ2-9): High Risk (08/23/2024)  Tobacco Use: High Risk (09/17/2024)     Readmission Risk Interventions    09/19/2024    3:16 PM  Readmission Risk Prevention Plan  Post Dischage Appt Complete  Medication  Screening Complete  Transportation Screening Complete

## 2024-09-25 LAB — ALDOSTERONE + RENIN ACTIVITY W/ RATIO
ALDO / PRA Ratio: 2.7 (ref 0.0–30.0)
Aldosterone: 5.2 ng/dL (ref 0.0–30.0)
PRA LC/MS/MS: 1.917 ng/mL/h (ref 0.167–5.380)

## 2024-09-27 DIAGNOSIS — J449 Chronic obstructive pulmonary disease, unspecified: Secondary | ICD-10-CM | POA: Diagnosis not present

## 2024-10-03 ENCOUNTER — Telehealth: Payer: Self-pay | Admitting: Neurology

## 2024-10-03 NOTE — Telephone Encounter (Signed)
 LVM and sent mychart msg informing pt of need to reschedule 10/04/24 appt - office opening late due to weather

## 2024-10-04 ENCOUNTER — Institutional Professional Consult (permissible substitution): Admitting: Neurology

## 2024-10-06 ENCOUNTER — Ambulatory Visit (INDEPENDENT_AMBULATORY_CARE_PROVIDER_SITE_OTHER)

## 2024-10-06 VITALS — BP 131/61 | HR 80 | Ht 65.0 in | Wt 272.8 lb

## 2024-10-06 DIAGNOSIS — G4733 Obstructive sleep apnea (adult) (pediatric): Secondary | ICD-10-CM | POA: Diagnosis not present

## 2024-10-06 DIAGNOSIS — E278 Other specified disorders of adrenal gland: Secondary | ICD-10-CM

## 2024-10-06 DIAGNOSIS — Z09 Encounter for follow-up examination after completed treatment for conditions other than malignant neoplasm: Secondary | ICD-10-CM

## 2024-10-06 DIAGNOSIS — J449 Chronic obstructive pulmonary disease, unspecified: Secondary | ICD-10-CM

## 2024-10-06 NOTE — Patient Instructions (Addendum)
 It was wonderful to see you today.  Please bring ALL of your medications with you to every visit.   Today we talked about:  Your hospital follow-up.  I am so glad to hear you are doing so well!  Continue to use both of your inhalers as prescribed.  You have a sleep study coming up with:   Dr. Dedra Doeheimer who is a neurologist that specializes in sleep medicine at  [facility] on December 30 at 9 AM [address], [address], [place], [place] [address]  [phone]  You did well on your walking test today which showed us  that you do not need to use oxygen while walking anymore.  You may continue to use your oxygen throughout the night if you would like until you see the sleep medicine doctor and are evaluated for obstructive sleep apnea.  They will monitor your oxygen overnight and determine if you need another CPAP.  Our office will try to schedule your adrenal CT for sometime after February 23.  Please watch out for this appointment phone call.   Thank you for choosing [facility] Family Medicine.   Please call [phone] with any questions about today's appointment.  Please arrive at least 15 minutes prior to your scheduled appointments.   If you had blood work today, I will send you a MyChart message or a letter if results are normal. Otherwise, I will give you a call.   If you had a referral placed, they will call you to set up an appointment. Please give us  a call if you don't hear back in the next 2 weeks.   If you need additional refills before your next appointment, please call your pharmacy first.   You should follow up in our clinic in 3 months.  Camie Dixons, DO Family Medicine

## 2024-10-06 NOTE — Assessment & Plan Note (Signed)
 4.8 x 3.6 x 6.8 cm right adrenal mass identified on CT chest during hospital stay. -Metanephrines and renin/aldosterone tests were within normal limits -Radiology recommendation for follow-up adrenal CT in 3 to 6 months from hospital discharge.  Will schedule adrenal CT for 12/19/2024 or later.

## 2024-10-06 NOTE — Progress Notes (Signed)
° ° ° °  SUBJECTIVE:   CHIEF COMPLAINT / HPI:   Kylie Garner presents today for hospital follow up.   Hospitalized at [facility] from 09/17/2024 to 09/13/2024, for acute hypoxic respiratory failure and adrenal incidentaloma.  Since discharge, patient reports feling much better, not needing to use oxygen too often especially if she is walking lightly at home.  She does use oxygen at night because she says it helps relax her and helps her go to sleep.  She denies any shortness of breath, chest pain, chest tightness, increased work of breathing, back pain, or any other complaints at this time.  PERTINENT  PMH / PSH: COPD, hypertension, OSA, hospitalization for multifocal pneumonia  OBJECTIVE:   BP 131/61   Pulse 80   Ht 5' 5 (1.651 m)   Wt 272 lb 12.8 oz (123.7 kg)   SpO2 96%   BMI 45.40 kg/m    General: A&O, NAD That cardiac: RRR, no m/r/g Respiratory: CTAB, normal WOB, no w/c/r, good air movement throughout all lung fields, good respiratory effort, not on any oxygen during entire visit, walk test patient desats to 88% during ambulation GI: Soft, NTTP, non-distended, bowel sounds present Extremities: NTTP, no peripheral edema. Neuro: Normal gait, moves all four extremities appropriately. Psych: Appropriate mood and affect  ASSESSMENT/PLAN:   Assessment & Plan Hospital discharge follow-up Chronic obstructive pulmonary disease, unspecified COPD type (HCC) OSA (obstructive sleep apnea) Doing well post hospital stay 11/22-11/28.  Only using oxygen if needed to walk longer distances and at night to sleep.  Still has no CPAP at home. -Sleep consult with Dr. Dedra Dohmeier scheduled for 10/25/2024 at 9 AM.  Patient is aware. -In office ambulation test significant for ambulatory desaturations down to 88%.  For COPD, O2 goal is 88 to 92%.  Patient is meeting this and therefore does not need supplemental oxygen at this time.  Discussed this with patient who verbalizes  understanding and agrees with plan.  She may continue to use oxygen at night until she is evaluated for OSA/CPAP at sleep consult later this month. -See me back in 2 months or sooner if needed. Adrenal incidentaloma 4.8 x 3.6 x 6.8 cm right adrenal mass identified on CT chest during hospital stay. -Metanephrines and renin/aldosterone tests were within normal limits -Radiology recommendation for follow-up adrenal CT in 3 to 6 months from hospital discharge.  Will schedule adrenal CT for 12/19/2024 or later.   Camie Dixons, DO [facility]

## 2024-10-07 ENCOUNTER — Ambulatory Visit: Payer: Self-pay | Attending: Family Medicine

## 2024-10-10 ENCOUNTER — Telehealth: Payer: Self-pay | Admitting: Pharmacist

## 2024-10-10 DIAGNOSIS — G4733 Obstructive sleep apnea (adult) (pediatric): Secondary | ICD-10-CM

## 2024-10-10 DIAGNOSIS — J9601 Acute respiratory failure with hypoxia: Secondary | ICD-10-CM

## 2024-10-10 DIAGNOSIS — Z720 Tobacco use: Secondary | ICD-10-CM

## 2024-10-10 NOTE — Telephone Encounter (Signed)
 Patient contacted for follow/up of tobacco cessation attempt.   Since last contact patient reports complete cessation. States she has minimal cravings.   Medications currently being used; None.  Continues to rates IMPORTANCE of quitting tobacco as high.  Rates CONFIDENCE in remaining quit from tobacco as high.   Motivation to quit: breathing. We discussed when her breathing improves and she feels like she is willing and able, a return to office visit for lung function test.   Total time with patient call and documentation of interaction: 6 minutes. Follow-up phone call planned: 2 months.

## 2024-10-10 NOTE — Telephone Encounter (Signed)
-----   Message from Maude Lagos sent at 09/06/2024  3:03 PM EST ----- Regarding: Quit follow-up after Majeed visit to determine smoking status Contact to celebrate success or to redevelop treatment plan.

## 2024-10-10 NOTE — Assessment & Plan Note (Signed)
 Patient contacted for follow/up of tobacco cessation attempt.   Since last contact patient reports complete cessation. States she has minimal cravings.   Medications currently being used; None.  Continues to rates IMPORTANCE of quitting tobacco as high.  Rates CONFIDENCE in remaining quit from tobacco as high.

## 2024-10-11 ENCOUNTER — Encounter (HOSPITAL_COMMUNITY): Payer: Self-pay

## 2024-10-11 ENCOUNTER — Telehealth (HOSPITAL_COMMUNITY): Payer: Self-pay

## 2024-10-11 NOTE — Addendum Note (Signed)
 Addended byBETHA LUPIE CREDIT on: 10/11/2024 12:16 PM   Modules accepted: Orders

## 2024-10-11 NOTE — Telephone Encounter (Signed)
 Called patient to see if she was interested in Pulmonary Rehab. Patient stated that she was interested.

## 2024-10-18 ENCOUNTER — Telehealth (HOSPITAL_COMMUNITY): Payer: Self-pay

## 2024-10-18 NOTE — Telephone Encounter (Signed)
 Pt insurance is active and benefits verified through [facility]. Co-pay $0, DED $0/$0 met, out of pocket $0/$0 met, co-insurance 0%. No pre-authorization required. 10/18/2024 @ 1:37pm, spoke with Rollene, REF# [id number].  *15 visit limit every 6 consecutive months*

## 2024-10-24 ENCOUNTER — Telehealth (HOSPITAL_COMMUNITY): Payer: Self-pay

## 2024-10-24 NOTE — Telephone Encounter (Signed)
 Pt called needing to change her pulmonary rehab orientation date to 11/02/24 do to having court.

## 2024-10-25 ENCOUNTER — Ambulatory Visit (INDEPENDENT_AMBULATORY_CARE_PROVIDER_SITE_OTHER): Admitting: Neurology

## 2024-10-25 ENCOUNTER — Encounter: Payer: Self-pay | Admitting: Neurology

## 2024-10-25 VITALS — BP 132/84 | HR 87 | Ht 65.0 in | Wt 277.0 lb

## 2024-10-25 DIAGNOSIS — J188 Other pneumonia, unspecified organism: Secondary | ICD-10-CM | POA: Diagnosis not present

## 2024-10-25 DIAGNOSIS — J441 Chronic obstructive pulmonary disease with (acute) exacerbation: Secondary | ICD-10-CM

## 2024-10-25 DIAGNOSIS — Z9981 Dependence on supplemental oxygen: Secondary | ICD-10-CM | POA: Diagnosis not present

## 2024-10-25 DIAGNOSIS — Z720 Tobacco use: Secondary | ICD-10-CM | POA: Diagnosis not present

## 2024-10-25 DIAGNOSIS — J9601 Acute respiratory failure with hypoxia: Secondary | ICD-10-CM

## 2024-10-25 DIAGNOSIS — E278 Other specified disorders of adrenal gland: Secondary | ICD-10-CM | POA: Diagnosis not present

## 2024-10-25 DIAGNOSIS — Z8669 Personal history of other diseases of the nervous system and sense organs: Secondary | ICD-10-CM | POA: Diagnosis not present

## 2024-10-25 MED ORDER — ALPRAZOLAM 0.5 MG PO TABS: 0.5000 mg | ORAL_TABLET | Freq: Every evening | ORAL | 0 refills | Status: AC | PRN
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
## 2024-10-25 NOTE — Addendum Note (Signed)
 Addended by: CHALICE SAUNAS on: 10/25/2024 11:27 AM   Modules accepted: Orders

## 2024-10-31 ENCOUNTER — Encounter (HOSPITAL_COMMUNITY)

## 2024-11-01 ENCOUNTER — Telehealth (HOSPITAL_COMMUNITY): Payer: Self-pay

## 2024-11-01 NOTE — Telephone Encounter (Signed)
 Confirmed Pulm Rehab appt for tomorrow at 10:30

## 2024-11-02 ENCOUNTER — Encounter (HOSPITAL_COMMUNITY): Payer: Self-pay

## 2024-11-02 ENCOUNTER — Encounter (HOSPITAL_COMMUNITY)
Admission: RE | Admit: 2024-11-02 | Discharge: 2024-11-02 | Disposition: A | Discharge status: Home / Self Care | Source: Ambulatory Visit | Attending: Family Medicine | Admitting: Family Medicine

## 2024-11-02 ENCOUNTER — Telehealth: Payer: Self-pay

## 2024-11-02 VITALS — BP 122/68 | HR 96 | Wt 275.8 lb

## 2024-11-02 DIAGNOSIS — G4733 Obstructive sleep apnea (adult) (pediatric): Secondary | ICD-10-CM | POA: Diagnosis present

## 2024-11-02 DIAGNOSIS — J449 Chronic obstructive pulmonary disease, unspecified: Secondary | ICD-10-CM

## 2024-11-02 NOTE — Progress Notes (Signed)
 Kylie Garner 64 y.o. female Pulmonary Rehab Orientation Note This patient who was referred to Pulmonary Rehab by Dr. Delores with the diagnosis of OSA arrived today in Cardiac and Pulmonary Rehab. She  arrived ambulatory with normal gait. She  does not carry portable oxygen. Per patient, Kylie Garner uses oxygen at night when going to sleep. Color good, skin warm and dry. Patient is oriented to time and place. Patient's medical history, psychosocial health, and medications reviewed. Psychosocial assessment reveals patient lives with family. Kylie Garner is currently unemployed, disabled. Patient hobbies include watching tv. Patient reports her stress level is moderate. Areas of stress/anxiety include family  and finances. Patient does exhibit signs of depression. Signs of depression include anxiety and hopelessness and fatigue. PHQ2/9 score 3/10. [name] shows good  coping skills with positive outlook on life. Offered emotional support and reassurance. Will continue to monitor. Physical assessment performed by Nurse pick: Ronal Levin RN. Please see their orientation physical assessment note. Kylie Garner reports she does take medications as prescribed. Patient states she follows a regular  diet. The patient would like to lose weight.. Patient's weight will be monitored closely. Demonstration and practice of PLB using pulse oximeter. Kylie Garner able to return demonstration satisfactorily. Safety and hand hygiene in the exercise area reviewed with patient. Kylie Garner voices understanding of the information reviewed. Department expectations discussed with patient and achievable goals were set. The patient shows enthusiasm about attending the program and we look forward to working with Kylie Garner. Kylie Garner completed a 6 min walk test today and is scheduled to begin exercise on 11/08/24.   [id number] Kylie Garner, BSRT

## 2024-11-02 NOTE — Progress Notes (Signed)
 Ambulatory referral for behavioral health placed per pt request/preference. Saturnino Bernett PEAK, BSN Cardiac and Emergency Planning/management Officer

## 2024-11-02 NOTE — Progress Notes (Signed)
" °  PSYCHOSOCIAL ASSESSMENT   Patient psychosocial assessment reveals patient shows positive coping skills with positive, outlook.  Financial and transportation barriers to rehab participation identified. Offered emotional support and reassurance. Agreeable for ambulatory referral to [facility] and verbalizes understanding that this is a separate cost from Cardiac/Pulmonary rehab and there may be a financial obligation depending upon patient's insurance plan coverage.  PHQ9 -10  Quality of life Score -18  Patient expressed to staff that she is dealing with anxiety/depression due to family issues and recent homelessness. She expresses that she would like a referral to a mental health specialist.    "

## 2024-11-02 NOTE — Telephone Encounter (Signed)
 Received call from pulmonary rehab regarding today's walk test.   They are requesting order for 2 L oxygen nasal cannula as patient has had desaturations during 6 minute walk test.   Patient currently only uses oxygen for sleeping.   Current DME supplier is [employer].   She is asking if she could receive portable oxygen concentrator. If orders are appropriate, will reach out to [employer] to see if they are able to accept/view walk test from pulmonary rehab.   Will forward to PCP.   Chiquita JAYSON English, RN

## 2024-11-02 NOTE — Progress Notes (Signed)
 Pulmonary Individual Treatment Plan  Patient Details  Name: Kylie Garner MRN: [id number] Date of Birth: 14-Nov-1960 Referring Provider:   Flowsheet Row PULMONARY REHAB OTHER RESP ORIENTATION from 11/02/2024 in [facility]  Referring Provider Delores    Initial Encounter Date:  Flowsheet Row PULMONARY REHAB OTHER RESP ORIENTATION from 11/02/2024 in [facility], [facility], [facility]  Date 11/02/24    Visit Diagnosis: Obstructive sleep apnea  Patient's Home Medications on Admission:  Current Medications[1]  Past Medical History: Past Medical History:  Diagnosis Date   Anxiety    Bilateral leg edema 09/07/2017   COPD (chronic obstructive pulmonary disease) (HCC)    Depression    Encounter for screening mammogram for malignant neoplasm of breast 03/14/2020   Headache 04/19/2018   Hypertension     Tobacco Use: Tobacco Use History[2]  Labs: Review Flowsheet  More data exists      Latest Ref Rng & Units 01/11/2019 03/14/2020 03/14/2022 10/01/2023 08/23/2024  Labs for ITP Cardiac and Pulmonary Rehab  Cholestrol 100 - 199 mg/dL - 771  809  - -  LDL (calc) 0 - 99 mg/dL - 847  878  - -  HDL-C >39 mg/dL - 43  34  - -  Trlycerides 0 - 149 mg/dL - 818  804  - -  Hemoglobin A1c 0.0 - 7.0 % 5.5  5.6  5.7  5.7  5.9     Capillary Blood Glucose: Lab Results  Component Value Date   GLUCAP 149 (H) 09/17/2024   GLUCAP 101 (H) 12/13/2008     Pulmonary Assessment Scores:  Pulmonary Assessment Scores     Row Name 11/02/24 1021         ADL UCSD   ADL Phase Entry     SOB Score total 38       CAT Score   CAT Score 18       mMRC Score   mMRC Score 3       UCSD: Self-administered rating of dyspnea associated with activities of daily living (ADLs) 6-point scale (0 = not at all to 5 = maximal or unable to do because of breathlessness)  Scoring Scores range from 0 to 120.  Minimally  important difference is 5 units  CAT: CAT can identify the health impairment of COPD patients and is better correlated with disease progression.  CAT has a scoring range of zero to 40. The CAT score is classified into four groups of low (less than 10), medium (10 - 20), high (21-30) and very high (31-40) based on the impact level of disease on health status. A CAT score over 10 suggests significant symptoms.  A worsening CAT score could be explained by an exacerbation, poor medication adherence, poor inhaler technique, or progression of COPD or comorbid conditions.  CAT MCID is 2 points  mMRC: mMRC (Modified Medical Research Council) Dyspnea Scale is used to assess the degree of baseline functional disability in patients of respiratory disease due to dyspnea. No minimal important difference is established. A decrease in score of 1 point or greater is considered a positive change.   Pulmonary Function Assessment:  Pulmonary Function Assessment - 11/02/24 1012       Breath   Bilateral Breath Sounds Clear    Shortness of Breath Yes;Limiting activity          Exercise Target Goals: Exercise Program Goal: Individual exercise prescription set using results from  initial 6 min walk test and THRR while considering  patients activity barriers and safety.   Exercise Prescription Goal: Initial exercise prescription builds to 30-45 minutes a day of aerobic activity, 2-3 days per week.  Home exercise guidelines will be given to patient during program as part of exercise prescription that the participant will acknowledge.  Activity Barriers & Risk Stratification:  Activity Barriers & Cardiac Risk Stratification - 11/02/24 1050       Activity Barriers & Cardiac Risk Stratification   Activity Barriers Deconditioning;Muscular Weakness;Shortness of Breath;Arthritis;Assistive Device    Cardiac Risk Stratification Moderate          6 Minute Walk:  6 Minute Walk     Row Name 11/02/24 1125          6 Minute Walk   Phase Initial     Distance 720 feet     Walk Time 6 minutes     # of Rest Breaks 2  2:10-2:38, 5:50-6:00     MPH 1.36     METS 1.42     RPE 11     Perceived Dyspnea  2     VO2 Peak 4.98     Symptoms No     Resting HR 96 bpm     Resting BP 122/68     Resting Oxygen Saturation  93 %     Exercise Oxygen Saturation  during 6 min walk 87 %     Max Ex. HR 108 bpm     Max Ex. BP 138/60     2 Minute Post BP 122/66       Interval HR   1 Minute HR 71     2 Minute HR 102     3 Minute HR 98     4 Minute HR 94     5 Minute HR 108     6 Minute HR 107     Interval Heart Rate? Yes       Interval Oxygen   Interval Oxygen? Yes     Baseline Oxygen Saturation % 93 %     1 Minute Oxygen Saturation % 92 %     1 Minute Liters of Oxygen 0 L     2 Minute Oxygen Saturation % 87 %     2 Minute Liters of Oxygen 0 L  increased to 2L     3 Minute Oxygen Saturation % 95 %     3 Minute Liters of Oxygen 2 L     4 Minute Oxygen Saturation % 93 %     4 Minute Liters of Oxygen 2 L     5 Minute Oxygen Saturation % 92 %     5 Minute Liters of Oxygen 2 L     6 Minute Oxygen Saturation % 91 %     6 Minute Liters of Oxygen 2 L     2 Minute Post Oxygen Saturation % 96 %     2 Minute Post Liters of Oxygen 2 L        Oxygen Initial Assessment:  Oxygen Initial Assessment - 11/02/24 1052       Home Oxygen   Home Oxygen Device E-Tanks    Sleep Oxygen Prescription Continuous    Liters per minute 2    Home Exercise Oxygen Prescription None    Home Resting Oxygen Prescription None    Compliance with Home Oxygen Use Yes      Initial 6 min Walk   Oxygen Used  Continuous    Liters per minute 2      Program Oxygen Prescription   Program Oxygen Prescription Continuous    Liters per minute 2      Intervention   Short Term Goals To learn and exhibit compliance with exercise, home and travel O2 prescription;To learn and understand importance of maintaining oxygen saturations>88%;To  learn and demonstrate proper use of respiratory medications;To learn and understand importance of monitoring SPO2 with pulse oximeter and demonstrate accurate use of the pulse oximeter.;To learn and demonstrate proper pursed lip breathing techniques or other breathing techniques.     Long  Term Goals Exhibits compliance with exercise, home  and travel O2 prescription;Maintenance of O2 saturations>88%;Compliance with respiratory medication;Verbalizes importance of monitoring SPO2 with pulse oximeter and return demonstration;Exhibits proper breathing techniques, such as pursed lip breathing or other method taught during program session;Demonstrates proper use of MDIs          Oxygen Re-Evaluation:   Oxygen Discharge (Final Oxygen Re-Evaluation):   Initial Exercise Prescription:  Initial Exercise Prescription - 11/02/24 1100       Date of Initial Exercise RX and Referring Provider   Date 11/02/24    Referring Provider Delores    Expected Discharge Date 01/24/25      Oxygen   Oxygen Continuous    Liters 2    Maintain Oxygen Saturation 88% or higher      NuStep   Level 1    SPM 60    Minutes 15    METs 1.5      Track   Minutes 15    METs 2      Prescription Details   Frequency (times per week) 2    Duration Progress to 30 minutes of continuous aerobic without signs/symptoms of physical distress      Intensity   THRR 40-80% of Max Heartrate 63-126    Ratings of Perceived Exertion 11-13    Perceived Dyspnea 0-4      Progression   Progression Continue to progress workloads to maintain intensity without signs/symptoms of physical distress.      Resistance Training   Training Prescription Yes    Weight red bands    Reps 10-15          Perform Capillary Blood Glucose checks as needed.  Exercise Prescription Changes:   Exercise Comments:   Exercise Goals and Review:   Exercise Goals     Row Name 11/02/24 1007             Exercise Goals   Increase  Physical Activity Yes       Intervention Provide advice, education, support and counseling about physical activity/exercise needs.;Develop an individualized exercise prescription for aerobic and resistive training based on initial evaluation findings, risk stratification, comorbidities and participant's personal goals.       Expected Outcomes Short Term: Attend rehab on a regular basis to increase amount of physical activity.;Long Term: Exercising regularly at least 3-5 days a week.;Long Term: Add in home exercise to make exercise part of routine and to increase amount of physical activity.       Increase Strength and Stamina Yes       Intervention Provide advice, education, support and counseling about physical activity/exercise needs.;Develop an individualized exercise prescription for aerobic and resistive training based on initial evaluation findings, risk stratification, comorbidities and participant's personal goals.       Expected Outcomes Short Term: Increase workloads from initial exercise prescription for resistance, speed, and METs.;Short Term:  Perform resistance training exercises routinely during rehab and add in resistance training at home;Long Term: Improve cardiorespiratory fitness, muscular endurance and strength as measured by increased METs and functional capacity ( )       Able to understand and use rate of perceived exertion (RPE) scale Yes       Intervention Provide education and explanation on how to use RPE scale       Expected Outcomes Short Term: Able to use RPE daily in rehab to express subjective intensity level;Long Term:  Able to use RPE to guide intensity level when exercising independently       Able to understand and use Dyspnea scale Yes       Intervention Provide education and explanation on how to use Dyspnea scale       Expected Outcomes Short Term: Able to use Dyspnea scale daily in rehab to express subjective sense of shortness of breath during exertion;Long Term:  Able to use Dyspnea scale to guide intensity level when exercising independently       Knowledge and understanding of Target Heart Rate Range (THRR) Yes       Intervention Provide education and explanation of THRR including how the numbers were predicted and where they are located for reference       Expected Outcomes Short Term: Able to state/look up THRR;Long Term: Able to use THRR to govern intensity when exercising independently;Short Term: Able to use daily as guideline for intensity in rehab       Understanding of Exercise Prescription Yes       Intervention Provide education, explanation, and written materials on patient's individual exercise prescription       Expected Outcomes Short Term: Able to explain program exercise prescription;Long Term: Able to explain home exercise prescription to exercise independently          Exercise Goals Re-Evaluation :   Discharge Exercise Prescription (Final Exercise Prescription Changes):   Nutrition:  Target Goals: Understanding of nutrition guidelines, daily intake of sodium 1500mg , cholesterol 200mg , calories 30% from fat and 7% or less from saturated fats, daily to have 5 or more servings of fruits and vegetables.  Biometrics:    Nutrition Therapy Plan and Nutrition Goals:   Nutrition Assessments:  MEDIFICTS Score Key: >=70 Need to make dietary changes  40-70 Heart Healthy Diet <= 40 Therapeutic Level Cholesterol Diet   Picture Your Plate Scores: <59 Unhealthy dietary pattern with much room for improvement. 41-50 Dietary pattern unlikely to meet recommendations for good health and room for improvement. 51-60 More healthful dietary pattern, with some room for improvement.  >60 Healthy dietary pattern, although there may be some specific behaviors that could be improved.    Nutrition Goals Re-Evaluation:   Nutrition Goals Discharge (Final Nutrition Goals Re-Evaluation):   Psychosocial: Target Goals: Acknowledge presence  or absence of significant depression and/or stress, maximize coping skills, provide positive support system. Participant is able to verbalize types and ability to use techniques and skills needed for reducing stress and depression.  Initial Review & Psychosocial Screening:  Initial Psych Review & Screening - 11/02/24 1033       Initial Review   Current issues with Current Depression;History of Depression;Current Anxiety/Panic;Current Psychotropic Meds;Current Stress Concerns    Source of Stress Concerns Family;Financial    Comments Tennyson is dealing with current depression. She feels her main stressors are family and financial issues.      Family Dynamics   Good Support System? Yes    Comments Has support  from her nephew      Screening Interventions   Interventions Encouraged to exercise;To provide support and resources with identified psychosocial needs;Program counselor consult    Expected Outcomes Short Term goal: Utilizing psychosocial counselor, staff and physician to assist with identification of specific Stressors or current issues interfering with healing process. Setting desired goal for each stressor or current issue identified.;Long Term Goal: Stressors or current issues are controlled or eliminated.;Short Term goal: Identification and review with participant of any Quality of Life or Depression concerns found by scoring the questionnaire.;Long Term goal: The participant improves quality of Life and PHQ9 Scores as seen by post scores and/or verbalization of changes          Quality of Life Scores:  Scores of 19 and below usually indicate a poorer quality of life in these areas.  A difference of  2-3 points is a clinically meaningful difference.  A difference of 2-3 points in the total score of the Quality of Life Index has been associated with significant improvement in overall quality of life, self-image, physical symptoms, and general health in studies assessing change in quality  of life.  PHQ-9: Review Flowsheet  More data exists      11/02/2024 10/06/2024 08/23/2024 05/18/2024 11/26/2023  Depression screen PHQ 2/9  Decreased Interest 2 1 1 1 1   Down, Depressed, Hopeless 1 2 2 2 2   PHQ - 2 Score 3 3 3 3 3   Altered sleeping 1 0 3 1 1   Tired, decreased energy 0 1 3 2 3   Change in appetite 2 2 3 3 1   Feeling bad or failure about yourself  1 3 3 3 3   Trouble concentrating 2 1 1 2 2   Moving slowly or fidgety/restless 1 1 0 1 3  Suicidal thoughts 0 1 0 0 0  PHQ-9 Score 10 12 16  15  16    Difficult doing work/chores Somewhat difficult - - - -    Details       Data saved with a previous flowsheet row definition        Interpretation of Total Score  Total Score Depression Severity:  1-4 = Minimal depression, 5-9 = Mild depression, 10-14 = Moderate depression, 15-19 = Moderately severe depression, 20-27 = Severe depression   Psychosocial Evaluation and Intervention:  Psychosocial Evaluation - 11/02/24 1119       Psychosocial Evaluation & Interventions   Interventions Stress management education;Relaxation education;Encouraged to exercise with the program and follow exercise prescription    Comments Emmersyn is currently dealing with anxiety/depression. She is currently on psychotropic meds and feels they are helping. She wants to have a referral placed for a mental health specialist. Staff will provide Nelida with resources on how to reduce stress and will place a referral for mental health specialist.    Expected Outcomes For Vidhi to have less depression and stress while participating in PR    Continue Psychosocial Services  Follow up required by staff          Psychosocial Re-Evaluation:   Psychosocial Discharge (Final Psychosocial Re-Evaluation):   Education: Education Goals: Education classes will be provided on a weekly basis, covering required topics. Participant will state understanding/return demonstration of topics presented.  Learning  Barriers/Preferences:  Learning Barriers/Preferences - 11/02/24 1049       Learning Barriers/Preferences   Learning Barriers Sight    Learning Preferences None          Education Topics: Know Your Numbers Group instruction that is supported  by a PowerPoint presentation. Instructor discusses importance of knowing and understanding resting, exercise, and post-exercise oxygen saturation, heart rate, and blood pressure. Oxygen saturation, heart rate, blood pressure, rating of perceived exertion, and dyspnea are reviewed along with a normal range for these values.    Exercise for the Pulmonary Patient Group instruction that is supported by a PowerPoint presentation. Instructor discusses benefits of exercise, core components of exercise, frequency, duration, and intensity of an exercise routine, importance of utilizing pulse oximetry during exercise, safety while exercising, and options of places to exercise outside of rehab.    MET Level  Group instruction provided by PowerPoint, verbal discussion, and written material to support subject matter. Instructor reviews what METs are and how to increase METs.    Pulmonary Medications Verbally interactive group education provided by instructor with focus on inhaled medications and proper administration.   Anatomy and Physiology of the Respiratory System Group instruction provided by PowerPoint, verbal discussion, and written material to support subject matter. Instructor reviews respiratory cycle and anatomical components of the respiratory system and their functions. Instructor also reviews differences in obstructive and restrictive respiratory diseases with examples of each.    Oxygen Safety Group instruction provided by PowerPoint, verbal discussion, and written material to support subject matter. There is an overview of What is Oxygen and Why do we need it.  Instructor also reviews how to create a safe environment for oxygen use, the  importance of using oxygen as prescribed, and the risks of noncompliance. There is a brief discussion on traveling with oxygen and resources the patient may utilize.   Oxygen Use Group instruction provided by PowerPoint, verbal discussion, and written material to discuss how supplemental oxygen is prescribed and different types of oxygen supply systems. Resources for more information are provided.    Breathing Techniques Group instruction that is supported by demonstration and informational handouts. Instructor discusses the benefits of pursed lip and diaphragmatic breathing and detailed demonstration on how to perform both.     Risk Factor Reduction Group instruction that is supported by a PowerPoint presentation. Instructor discusses the definition of a risk factor, different risk factors for pulmonary disease, and how the heart and lungs work together.   Pulmonary Diseases Group instruction provided by PowerPoint, verbal discussion, and written material to support subject matter. Instructor gives an overview of the different type of pulmonary diseases. There is also a discussion on risk factors and symptoms as well as ways to manage the diseases.   Stress and Energy Conservation Group instruction provided by PowerPoint, verbal discussion, and written material to support subject matter. Instructor gives an overview of stress and the impact it can have on the body. Instructor also reviews ways to reduce stress. There is also a discussion on energy conservation and ways to conserve energy throughout the day.   Warning Signs and Symptoms Group instruction provided by PowerPoint, verbal discussion, and written material to support subject matter. Instructor reviews warning signs and symptoms of stroke, heart attack, cold and flu. Instructor also reviews ways to prevent the spread of infection.   Other Education Group or individual verbal, written, or video instructions that support the  educational goals of the pulmonary rehab program.    Knowledge Questionnaire Score:  Knowledge Questionnaire Score - 11/02/24 1126       Knowledge Questionnaire Score   Pre Score 10/18          Core Components/Risk Factors/Patient Goals at Admission:  Personal Goals and Risk Factors at Admission - 11/02/24  1049       Core Components/Risk Factors/Patient Goals on Admission    Weight Management Yes;Weight Loss    Intervention Weight Management: Develop a combined nutrition and exercise program designed to reach desired caloric intake, while maintaining appropriate intake of nutrient and fiber, sodium and fats, and appropriate energy expenditure required for the weight goal.;Weight Management: Provide education and appropriate resources to help participant work on and attain dietary goals.;Weight Management/Obesity: Establish reasonable short term and long term weight goals.;Obesity: Provide education and appropriate resources to help participant work on and attain dietary goals.    Admit Weight 275 lb 12.7 oz (125.1 kg)    Expected Outcomes Short Term: Continue to assess and modify interventions until short term weight is achieved;Long Term: Adherence to nutrition and physical activity/exercise program aimed toward attainment of established weight goal;Weight Loss: Understanding of general recommendations for a balanced deficit meal plan, which promotes 1-2 lb weight loss per week and includes a negative energy balance of [phone] kcal/d;Understanding recommendations for meals to include 15-35% energy as protein, 25-35% energy from fat, 35-60% energy from carbohydrates, less than 200mg  of dietary cholesterol, 20-35 gm of total fiber daily;Understanding of distribution of calorie intake throughout the day with the consumption of 4-5 meals/snacks    Improve shortness of breath with ADL's Yes    Intervention Provide education, individualized exercise plan and daily activity instruction to help  decrease symptoms of SOB with activities of daily living.    Expected Outcomes Short Term: Improve cardiorespiratory fitness to achieve a reduction of symptoms when performing ADLs;Long Term: Be able to perform more ADLs without symptoms or delay the onset of symptoms    Stress Yes    Intervention Offer individual and/or small group education and counseling on adjustment to heart disease, stress management and health-related lifestyle change. Teach and support self-help strategies.;Refer participants experiencing significant psychosocial distress to appropriate mental health specialists for further evaluation and treatment. When possible, include family members and significant others in education/counseling sessions.    Expected Outcomes Short Term: Participant demonstrates changes in health-related behavior, relaxation and other stress management skills, ability to obtain effective social support, and compliance with psychotropic medications if prescribed.;Long Term: Emotional wellbeing is indicated by absence of clinically significant psychosocial distress or social isolation.          Core Components/Risk Factors/Patient Goals Review:    Core Components/Risk Factors/Patient Goals at Discharge (Final Review):    ITP Comments:   Comments: Dr. Slater Staff is Medical Director for Pulmonary Rehab at [facility].       [1]  Current Outpatient Medications:    albuterol  (PROAIR  HFA) 108 (90 Base) MCG/ACT inhaler, Inhale 1-2 puffs into the lungs every 6 (six) hours as needed for wheezing or shortness of breath., Disp: 18 g, Rfl: 1   busPIRone  (BUSPAR ) 10 MG tablet, Take 1 tablet (10 mg total) by mouth 2 (two) times daily. Patient needs to make appointment to be seen by PCP., Disp: 60 tablet, Rfl: 0   guaiFENesin  (MUCINEX ) 600 MG 12 hr tablet, Take 1 tablet (600 mg total) by mouth 2 (two) times daily., Disp: 60 tablet, Rfl: 0   Ibuprofen 200 MG CAPS, Take 600 mg by mouth daily as needed  (for pain)., Disp: , Rfl:    Lactobacillus (ACIDOPHILUS) CAPS capsule, Take 2 capsules by mouth 3 (three) times daily., Disp: 180 capsule, Rfl: 0   lidocaine  (LIDODERM ) 5 %, Place 1 patch onto the skin daily. Remove & Discard patch within 12  hours or as directed by MD, Disp: , Rfl:    Nystatin  (GERHARDT'S BUTT CREAM) CREA, Apply 1 Application topically daily., Disp: , Rfl:    sertraline  (ZOLOFT ) 100 MG tablet, Take 1 tablet (100 mg total) by mouth daily., Disp: 90 tablet, Rfl: 3   umeclidinium-vilanterol (ANORO ELLIPTA ) 62.5-25 MCG/ACT AEPB, Inhale 1 puff into the lungs daily., Disp: 60 each, Rfl: 0   ALPRAZolam  (XANAX ) 0.5 MG tablet, Take 1 tablet (0.5 mg total) by mouth at bedtime as needed for anxiety (sleep study to be used in lab). (Patient not taking: Reported on 11/02/2024), Disp: 2 tablet, Rfl: 0 [2]  Social History Tobacco Use  Smoking Status Former   Current packs/day: 0.00   Average packs/day: 0.1 packs/day for 47.9 years (4.8 ttl pk-yrs)   Types: Cigarettes   Start date: 10/27/1976   Quit date: 09/21/2024   Years since quitting: 0.1  Smokeless Tobacco Never

## 2024-11-02 NOTE — Progress Notes (Signed)
" °   11/02/24 1125  6 Minute Walk  Phase Initial  Distance 720 feet  Walk Time 6 minutes  # of Rest Breaks 2 (2:10-2:38, 5:50-6:00)  MPH 1.36  METS 1.42  RPE 11  Perceived Dyspnea  2  VO2 Peak 4.98  Symptoms No  Resting HR 96 bpm  Resting BP 122/68  Resting Oxygen Saturation  93 %  Exercise Oxygen Saturation  during 6 min walk 87 %  Max Ex. HR 108 bpm  Max Ex. BP 138/60  2 Minute Post BP 122/66  Interval HR  1 Minute HR 71  2 Minute HR 102  3 Minute HR 98  4 Minute HR 94  5 Minute HR 108  6 Minute HR 107  Interval Heart Rate? Yes  Interval Oxygen  Interval Oxygen? Yes  Baseline Oxygen Saturation % 93 %  1 Minute Oxygen Saturation % 92 %  1 Minute Liters of Oxygen 0 L  2 Minute Oxygen Saturation % 87 %  2 Minute Liters of Oxygen 0 L (increased to 2L)  3 Minute Oxygen Saturation % 95 %  3 Minute Liters of Oxygen 2 L  4 Minute Oxygen Saturation % 93 %  4 Minute Liters of Oxygen 2 L  5 Minute Oxygen Saturation % 92 %  5 Minute Liters of Oxygen 2 L  6 Minute Oxygen Saturation % 91 %  6 Minute Liters of Oxygen 2 L  2 Minute Post Oxygen Saturation % 96 %  2 Minute Post Liters of Oxygen 2 L    "

## 2024-11-04 ENCOUNTER — Telehealth: Payer: Self-pay | Admitting: Neurology

## 2024-11-04 NOTE — Telephone Encounter (Signed)
 Split [facility] pending.

## 2024-11-08 ENCOUNTER — Other Ambulatory Visit: Payer: Self-pay

## 2024-11-08 ENCOUNTER — Encounter (HOSPITAL_COMMUNITY): Admission: RE | Admit: 2024-11-08 | Source: Ambulatory Visit

## 2024-11-09 NOTE — Telephone Encounter (Signed)
Community message sent to [employer].   Veronda Prude, RN

## 2024-11-09 NOTE — Addendum Note (Signed)
 Addended by: Zaylon Bossier C on: 11/09/2024 11:58 AM   Modules accepted: Orders

## 2024-11-09 NOTE — Addendum Note (Signed)
 Addended byBETHA LUPIE CREDIT on: 11/09/2024 09:09 PM   Modules accepted: Orders

## 2024-11-09 NOTE — Telephone Encounter (Signed)
 Please see the following message from [employer] and update order.   we do not provide POC's for a continuous use only on a Pulse dose so we would need an order for a poc eval to evaluate patient to see if she can maintain a pulse dose usage because if she is continuous only she can not use our poc's. So to evaluate we would need a new order saying please evaluate patient for a poc  1-6 pulse dose and if patient qualifies please dispense.    I have pended updated order for provider review.   Chiquita JAYSON English, RN

## 2024-11-10 ENCOUNTER — Encounter (HOSPITAL_COMMUNITY)

## 2024-11-10 ENCOUNTER — Telehealth (HOSPITAL_COMMUNITY): Payer: Self-pay

## 2024-11-10 NOTE — Telephone Encounter (Signed)
Community message sent to [employer] with updated order.   Hannah C Pipkin, RN  

## 2024-11-10 NOTE — Telephone Encounter (Signed)
 Patient c/o for 8:15 PR class due to car trouble.

## 2024-11-15 ENCOUNTER — Encounter (HOSPITAL_COMMUNITY): Admission: RE | Admit: 2024-11-15 | Discharge: 2024-11-15 | Disposition: A | Source: Ambulatory Visit

## 2024-11-15 VITALS — Wt 279.5 lb

## 2024-11-15 DIAGNOSIS — G4733 Obstructive sleep apnea (adult) (pediatric): Secondary | ICD-10-CM

## 2024-11-15 NOTE — Telephone Encounter (Signed)
 Split  [facility] [place]: [id number] (exp. 11/04/24 to 01/28/25)

## 2024-11-15 NOTE — Progress Notes (Signed)
 Daily Session Note  Patient Details  Name: Kylie Garner MRN: [id number] Date of Birth: 1960/12/08 Referring Provider:   Flowsheet Row PULMONARY REHAB OTHER RESP ORIENTATION from 11/02/2024 in [facility]  Referring Provider Delores    Encounter Date: 11/15/2024  Check In:  Session Check In - 11/15/24 0817       Check-In   Supervising physician immediately available to respond to emergencies [facility] MD immediately available    Physician(s) Lum Louis, NP    Location [facility]    Staff Present Ronal Levin, RN, BSN;Hommer Cunliffe Claudene Neita Moats, MS, ACSM-CEP, Exercise Physiologist    Virtual Visit No    Medication changes reported     No    Fall or balance concerns reported    No    Tobacco Cessation No Change    Warm-up and Cool-down Performed as group-led instruction    Resistance Training Performed Yes    VAD Patient? No    PAD/SET Patient? No      Pain Assessment   Currently in Pain? No/denies    Multiple Pain Sites No          Capillary Blood Glucose: No results found for this or any previous visit (from the past 24 hours).   Exercise Prescription Changes - 11/15/24 0800       Response to Exercise   Blood Pressure (Admit) 130/68    Blood Pressure (Exercise) 120/58    Blood Pressure (Exit) 118/62    Heart Rate (Admit) 94 bpm    Heart Rate (Exercise) 118 bpm    Heart Rate (Exit) 85 bpm    Oxygen Saturation (Admit) 92 %    Oxygen Saturation (Exercise) 95 %    Oxygen Saturation (Exit) 96 %    Rating of Perceived Exertion (Exercise) 11    Perceived Dyspnea (Exercise) 1    Duration Progress to 30 minutes of  aerobic without signs/symptoms of physical distress    Intensity THRR unchanged      Resistance Training   Weight red bands    Reps 10-15    Time 10 Minutes      Oxygen   Oxygen Continuous    Liters 2      NuStep   Level 1    SPM 51    Minutes 10    METs 1.7       Track   Laps 7    Minutes 15    METs 2.08      Oxygen   Maintain Oxygen Saturation 88% or higher          Tobacco Use History[1]  Goals Met:  Proper associated with RPD/PD & O2 Sat Independence with exercise equipment Exercise tolerated well No report of concerns or symptoms today Strength training completed today  Goals Unmet:  Not Applicable  Comments: Service time is from 0814 to 0919.    Dr. Slater Staff is Medical Director for Pulmonary Rehab at [facility].     [1]  Social History Tobacco Use  Smoking Status Former   Current packs/day: 0.00   Average packs/day: 0.1 packs/day for 47.9 years (4.8 ttl pk-yrs)   Types: Cigarettes   Start date: 10/27/1976   Quit date: 09/21/2024   Years since quitting: 0.1  Smokeless Tobacco Never

## 2024-11-16 NOTE — Progress Notes (Signed)
 Pulmonary Individual Treatment Plan  Patient Details  Name: Kylie Garner MRN: [id number] Date of Birth: 1961/07/15 Referring Provider:   Flowsheet Row PULMONARY REHAB OTHER RESP ORIENTATION from 11/02/2024 in [facility]  Referring Provider Delores    Initial Encounter Date:  Flowsheet Row PULMONARY REHAB OTHER RESP ORIENTATION from 11/02/2024 in [facility]  Date 11/02/24    Visit Diagnosis: Obstructive sleep apnea  Patient's Home Medications on Admission:  Current Medications[1]  Past Medical History: Past Medical History:  Diagnosis Date   Anxiety    Bilateral leg edema 09/07/2017   COPD (chronic obstructive pulmonary disease) (HCC)    Depression    Encounter for screening mammogram for malignant neoplasm of breast 03/14/2020   Headache 04/19/2018   Hypertension     Tobacco Use: Tobacco Use History[2]  Labs: Review Flowsheet  More data exists      Latest Ref Rng & Units 01/11/2019 03/14/2020 03/14/2022 10/01/2023 08/23/2024  Labs for ITP Cardiac and Pulmonary Rehab  Cholestrol 100 - 199 mg/dL - 771  809  - -  LDL (calc) 0 - 99 mg/dL - 847  878  - -  HDL-C >39 mg/dL - 43  34  - -  Trlycerides 0 - 149 mg/dL - 818  804  - -  Hemoglobin A1c 0.0 - 7.0 % 5.5  5.6  5.7  5.7  5.9     Capillary Blood Glucose: Lab Results  Component Value Date   GLUCAP 149 (H) 09/17/2024   GLUCAP 101 (H) 12/13/2008     Pulmonary Assessment Scores:  Pulmonary Assessment Scores     Row Name 11/02/24 1021         ADL UCSD   ADL Phase Entry     SOB Score total 38       CAT Score   CAT Score 18       mMRC Score   mMRC Score 3       UCSD: Self-administered rating of dyspnea associated with activities of daily living (ADLs) 6-point scale (0 = not at all to 5 = maximal or unable to do because of breathlessness)  Scoring Scores range from 0 to 120.  Minimally  important difference is 5 units  CAT: CAT can identify the health impairment of COPD patients and is better correlated with disease progression.  CAT has a scoring range of zero to 40. The CAT score is classified into four groups of low (less than 10), medium (10 - 20), high (21-30) and very high (31-40) based on the impact level of disease on health status. A CAT score over 10 suggests significant symptoms.  A worsening CAT score could be explained by an exacerbation, poor medication adherence, poor inhaler technique, or progression of COPD or comorbid conditions.  CAT MCID is 2 points  mMRC: mMRC (Modified Medical Research Council) Dyspnea Scale is used to assess the degree of baseline functional disability in patients of respiratory disease due to dyspnea. No minimal important difference is established. A decrease in score of 1 point or greater is considered a positive change.   Pulmonary Function Assessment:  Pulmonary Function Assessment - 11/02/24 1012       Breath   Bilateral Breath Sounds Clear    Shortness of Breath Yes;Limiting activity          Exercise Target Goals: Exercise Program Goal: Individual exercise prescription set using results from  initial 6 min walk test and THRR while considering  patients activity barriers and safety.   Exercise Prescription Goal: Initial exercise prescription builds to 30-45 minutes a day of aerobic activity, 2-3 days per week.  Home exercise guidelines will be given to patient during program as part of exercise prescription that the participant will acknowledge.  Activity Barriers & Risk Stratification:  Activity Barriers & Cardiac Risk Stratification - 11/02/24 1050       Activity Barriers & Cardiac Risk Stratification   Activity Barriers Deconditioning;Muscular Weakness;Shortness of Breath;Arthritis;Assistive Device    Cardiac Risk Stratification Moderate          6 Minute Walk:  6 Minute Walk     Row Name 11/02/24 1125          6 Minute Walk   Phase Initial     Distance 720 feet     Walk Time 6 minutes     # of Rest Breaks 2  2:10-2:38, 5:50-6:00     MPH 1.36     METS 1.42     RPE 11     Perceived Dyspnea  2     VO2 Peak 4.98     Symptoms No     Resting HR 96 bpm     Resting BP 122/68     Resting Oxygen Saturation  93 %     Exercise Oxygen Saturation  during 6 min walk 87 %     Max Ex. HR 108 bpm     Max Ex. BP 138/60     2 Minute Post BP 122/66       Interval HR   1 Minute HR 71     2 Minute HR 102     3 Minute HR 98     4 Minute HR 94     5 Minute HR 108     6 Minute HR 107     Interval Heart Rate? Yes       Interval Oxygen   Interval Oxygen? Yes     Baseline Oxygen Saturation % 93 %     1 Minute Oxygen Saturation % 92 %     1 Minute Liters of Oxygen 0 L     2 Minute Oxygen Saturation % 87 %     2 Minute Liters of Oxygen 0 L  increased to 2L     3 Minute Oxygen Saturation % 95 %     3 Minute Liters of Oxygen 2 L     4 Minute Oxygen Saturation % 93 %     4 Minute Liters of Oxygen 2 L     5 Minute Oxygen Saturation % 92 %     5 Minute Liters of Oxygen 2 L     6 Minute Oxygen Saturation % 91 %     6 Minute Liters of Oxygen 2 L     2 Minute Post Oxygen Saturation % 96 %     2 Minute Post Liters of Oxygen 2 L        Oxygen Initial Assessment:  Oxygen Initial Assessment - 11/02/24 1052       Home Oxygen   Home Oxygen Device E-Tanks    Sleep Oxygen Prescription Continuous    Liters per minute 2    Home Exercise Oxygen Prescription None    Home Resting Oxygen Prescription None    Compliance with Home Oxygen Use Yes      Initial 6 min Walk   Oxygen Used  Continuous    Liters per minute 2      Program Oxygen Prescription   Program Oxygen Prescription Continuous    Liters per minute 2      Intervention   Short Term Goals To learn and exhibit compliance with exercise, home and travel O2 prescription;To learn and understand importance of maintaining oxygen saturations>88%;To  learn and demonstrate proper use of respiratory medications;To learn and understand importance of monitoring SPO2 with pulse oximeter and demonstrate accurate use of the pulse oximeter.;To learn and demonstrate proper pursed lip breathing techniques or other breathing techniques.     Long  Term Goals Exhibits compliance with exercise, home  and travel O2 prescription;Maintenance of O2 saturations>88%;Compliance with respiratory medication;Verbalizes importance of monitoring SPO2 with pulse oximeter and return demonstration;Exhibits proper breathing techniques, such as pursed lip breathing or other method taught during program session;Demonstrates proper use of MDIs          Oxygen Re-Evaluation:  Oxygen Re-Evaluation     Row Name 11/07/24 0730             Program Oxygen Prescription   Program Oxygen Prescription Continuous       Liters per minute 2         Home Oxygen   Home Oxygen Device E-Tanks       Sleep Oxygen Prescription Continuous       Liters per minute 2       Home Exercise Oxygen Prescription None       Home Resting Oxygen Prescription None       Compliance with Home Oxygen Use Yes         Goals/Expected Outcomes   Short Term Goals To learn and exhibit compliance with exercise, home and travel O2 prescription;To learn and understand importance of maintaining oxygen saturations>88%;To learn and demonstrate proper use of respiratory medications;To learn and understand importance of monitoring SPO2 with pulse oximeter and demonstrate accurate use of the pulse oximeter.;To learn and demonstrate proper pursed lip breathing techniques or other breathing techniques.        Long  Term Goals Exhibits compliance with exercise, home  and travel O2 prescription;Maintenance of O2 saturations>88%;Compliance with respiratory medication;Verbalizes importance of monitoring SPO2 with pulse oximeter and return demonstration;Exhibits proper breathing techniques, such as pursed lip breathing or  other method taught during program session;Demonstrates proper use of MDIs       Goals/Expected Outcomes Compliance and understanding of oxygen saturation monitoring and breathing techniques to decrease shortness of breath.          Oxygen Discharge (Final Oxygen Re-Evaluation):  Oxygen Re-Evaluation - 11/07/24 0730       Program Oxygen Prescription   Program Oxygen Prescription Continuous    Liters per minute 2      Home Oxygen   Home Oxygen Device E-Tanks    Sleep Oxygen Prescription Continuous    Liters per minute 2    Home Exercise Oxygen Prescription None    Home Resting Oxygen Prescription None    Compliance with Home Oxygen Use Yes      Goals/Expected Outcomes   Short Term Goals To learn and exhibit compliance with exercise, home and travel O2 prescription;To learn and understand importance of maintaining oxygen saturations>88%;To learn and demonstrate proper use of respiratory medications;To learn and understand importance of monitoring SPO2 with pulse oximeter and demonstrate accurate use of the pulse oximeter.;To learn and demonstrate proper pursed lip breathing techniques or other breathing techniques.     Long  Term Goals  Exhibits compliance with exercise, home  and travel O2 prescription;Maintenance of O2 saturations>88%;Compliance with respiratory medication;Verbalizes importance of monitoring SPO2 with pulse oximeter and return demonstration;Exhibits proper breathing techniques, such as pursed lip breathing or other method taught during program session;Demonstrates proper use of MDIs    Goals/Expected Outcomes Compliance and understanding of oxygen saturation monitoring and breathing techniques to decrease shortness of breath.          Initial Exercise Prescription:  Initial Exercise Prescription - 11/02/24 1100       Date of Initial Exercise RX and Referring Provider   Date 11/02/24    Referring Provider Delores    Expected Discharge Date 01/24/25      Oxygen    Oxygen Continuous    Liters 2    Maintain Oxygen Saturation 88% or higher      NuStep   Level 1    SPM 60    Minutes 15    METs 1.5      Track   Minutes 15    METs 2      Prescription Details   Frequency (times per week) 2    Duration Progress to 30 minutes of continuous aerobic without signs/symptoms of physical distress      Intensity   THRR 40-80% of Max Heartrate 63-126    Ratings of Perceived Exertion 11-13    Perceived Dyspnea 0-4      Progression   Progression Continue to progress workloads to maintain intensity without signs/symptoms of physical distress.      Resistance Training   Training Prescription Yes    Weight red bands    Reps 10-15          Perform Capillary Blood Glucose checks as needed.  Exercise Prescription Changes:   Exercise Prescription Changes     Row Name 11/15/24 0800             Response to Exercise   Blood Pressure (Admit) 130/68       Blood Pressure (Exercise) 120/58       Blood Pressure (Exit) 118/62       Heart Rate (Admit) 94 bpm       Heart Rate (Exercise) 118 bpm       Heart Rate (Exit) 85 bpm       Oxygen Saturation (Admit) 92 %       Oxygen Saturation (Exercise) 95 %       Oxygen Saturation (Exit) 96 %       Rating of Perceived Exertion (Exercise) 11       Perceived Dyspnea (Exercise) 1       Duration Progress to 30 minutes of  aerobic without signs/symptoms of physical distress       Intensity THRR unchanged         Resistance Training   Weight red bands       Reps 10-15       Time 10 Minutes         Oxygen   Oxygen Continuous       Liters 2         NuStep   Level 1       SPM 51       Minutes 10       METs 1.7         Track   Laps 7       Minutes 15       METs 2.08         Oxygen  Maintain Oxygen Saturation 88% or higher          Exercise Comments:   Exercise Goals and Review:   Exercise Goals     Row Name 11/02/24 1007             Exercise Goals   Increase Physical Activity  Yes       Intervention Provide advice, education, support and counseling about physical activity/exercise needs.;Develop an individualized exercise prescription for aerobic and resistive training based on initial evaluation findings, risk stratification, comorbidities and participant's personal goals.       Expected Outcomes Short Term: Attend rehab on a regular basis to increase amount of physical activity.;Long Term: Exercising regularly at least 3-5 days a week.;Long Term: Add in home exercise to make exercise part of routine and to increase amount of physical activity.       Increase Strength and Stamina Yes       Intervention Provide advice, education, support and counseling about physical activity/exercise needs.;Develop an individualized exercise prescription for aerobic and resistive training based on initial evaluation findings, risk stratification, comorbidities and participant's personal goals.       Expected Outcomes Short Term: Increase workloads from initial exercise prescription for resistance, speed, and METs.;Short Term: Perform resistance training exercises routinely during rehab and add in resistance training at home;Long Term: Improve cardiorespiratory fitness, muscular endurance and strength as measured by increased METs and functional capacity ( )       Able to understand and use rate of perceived exertion (RPE) scale Yes       Intervention Provide education and explanation on how to use RPE scale       Expected Outcomes Short Term: Able to use RPE daily in rehab to express subjective intensity level;Long Term:  Able to use RPE to guide intensity level when exercising independently       Able to understand and use Dyspnea scale Yes       Intervention Provide education and explanation on how to use Dyspnea scale       Expected Outcomes Short Term: Able to use Dyspnea scale daily in rehab to express subjective sense of shortness of breath during exertion;Long Term: Able to use  Dyspnea scale to guide intensity level when exercising independently       Knowledge and understanding of Target Heart Rate Range (THRR) Yes       Intervention Provide education and explanation of THRR including how the numbers were predicted and where they are located for reference       Expected Outcomes Short Term: Able to state/look up THRR;Long Term: Able to use THRR to govern intensity when exercising independently;Short Term: Able to use daily as guideline for intensity in rehab       Understanding of Exercise Prescription Yes       Intervention Provide education, explanation, and written materials on patient's individual exercise prescription       Expected Outcomes Short Term: Able to explain program exercise prescription;Long Term: Able to explain home exercise prescription to exercise independently          Exercise Goals Re-Evaluation :  Exercise Goals Re-Evaluation     Row Name 11/07/24 0730             Exercise Goal Re-Evaluation   Exercise Goals Review Increase Physical Activity;Able to understand and use rate of perceived exertion (RPE) scale;Knowledge and understanding of Target Heart Rate Range (THRR);Understanding of Exercise Prescription;Increase Strength and Stamina;Able to understand and use Dyspnea scale  Comments Nadine is sheduled to begin exercise om 1/13.       Expected Outcomes Through exercise at rehab and home, the patient will decrease shortness of breath with daily activities and feel confident in carrying out an exercise regimen at home.          Discharge Exercise Prescription (Final Exercise Prescription Changes):  Exercise Prescription Changes - 11/15/24 0800       Response to Exercise   Blood Pressure (Admit) 130/68    Blood Pressure (Exercise) 120/58    Blood Pressure (Exit) 118/62    Heart Rate (Admit) 94 bpm    Heart Rate (Exercise) 118 bpm    Heart Rate (Exit) 85 bpm    Oxygen Saturation (Admit) 92 %    Oxygen Saturation (Exercise) 95  %    Oxygen Saturation (Exit) 96 %    Rating of Perceived Exertion (Exercise) 11    Perceived Dyspnea (Exercise) 1    Duration Progress to 30 minutes of  aerobic without signs/symptoms of physical distress    Intensity THRR unchanged      Resistance Training   Weight red bands    Reps 10-15    Time 10 Minutes      Oxygen   Oxygen Continuous    Liters 2      NuStep   Level 1    SPM 51    Minutes 10    METs 1.7      Track   Laps 7    Minutes 15    METs 2.08      Oxygen   Maintain Oxygen Saturation 88% or higher          Nutrition:  Target Goals: Understanding of nutrition guidelines, daily intake of sodium 1500mg , cholesterol 200mg , calories 30% from fat and 7% or less from saturated fats, daily to have 5 or more servings of fruits and vegetables.  Biometrics:    Nutrition Therapy Plan and Nutrition Goals:  Nutrition Therapy & Goals - 11/15/24 0843       Nutrition Therapy   Diet Regular diet      Personal Nutrition Goals   Nutrition Goal Patient to improve diet quality by using the plate method as a guide for meal planning to include lean protein/plant protein, fruits, vegetables, whole grains, nonfat dairy as part of a well-balanced diet.    Personal Goal #2 Patient to identify strategies for weight loss with goal of 0.5-2 # per week of weight loss.    Comments Patient with medical history significant for COPD, hypertension, OSA, adrenal incidentaloma. Pt expresses desire to lose weight; current BMI of 45.9 kg/2 in Obesity Class III. Pt feels need to limit carbohydrates given difficulty practicing portion control. Typically eats ~ 2 meals daily which consist of animal protein and vegetables. Typically does not eat breakfast. RD reviewed portion plate; discussed importance of filling plate with high fiber foods to help with hunger management. Also discussed need for carbohydrates as quick source of fuel. Encouraged pt to eat small snack such as yogurt with fruit or  peanut butter and crackers prior to exercise class. Patient will benefit from participation in pulmonary rehab for nutrition, exercise, and lifestyle modification.      Intervention Plan   Intervention Prescribe, educate and counsel regarding individualized specific dietary modifications aiming towards targeted core components such as weight, hypertension, lipid management, diabetes, heart failure and other comorbidities.;Nutrition handout(s) given to patient.   Handouts: Weight Loss Considerations, Nutrition Tips for Better Breathing  Expected Outcomes Short Term Goal: Understand basic principles of dietary content, such as calories, fat, sodium, cholesterol and nutrients.;Long Term Goal: Adherence to prescribed nutrition plan.          Nutrition Assessments:  MEDIFICTS Score Key: >=70 Need to make dietary changes  40-70 Heart Healthy Diet <= 40 Therapeutic Level Cholesterol Diet   Picture Your Plate Scores: <59 Unhealthy dietary pattern with much room for improvement. 41-50 Dietary pattern unlikely to meet recommendations for good health and room for improvement. 51-60 More healthful dietary pattern, with some room for improvement.  >60 Healthy dietary pattern, although there may be some specific behaviors that could be improved.    Nutrition Goals Re-Evaluation:   Nutrition Goals Discharge (Final Nutrition Goals Re-Evaluation):   Psychosocial: Target Goals: Acknowledge presence or absence of significant depression and/or stress, maximize coping skills, provide positive support system. Participant is able to verbalize types and ability to use techniques and skills needed for reducing stress and depression.  Initial Review & Psychosocial Screening:  Initial Psych Review & Screening - 11/02/24 1033       Initial Review   Current issues with Current Depression;History of Depression;Current Anxiety/Panic;Current Psychotropic Meds;Current Stress Concerns    Source of Stress  Concerns Family;Financial    Comments Maham is dealing with current depression. She feels her main stressors are family and financial issues.      Family Dynamics   Good Support System? Yes    Comments Has support from her nephew      Screening Interventions   Interventions Encouraged to exercise;To provide support and resources with identified psychosocial needs;Program counselor consult    Expected Outcomes Short Term goal: Utilizing psychosocial counselor, staff and physician to assist with identification of specific Stressors or current issues interfering with healing process. Setting desired goal for each stressor or current issue identified.;Long Term Goal: Stressors or current issues are controlled or eliminated.;Short Term goal: Identification and review with participant of any Quality of Life or Depression concerns found by scoring the questionnaire.;Long Term goal: The participant improves quality of Life and PHQ9 Scores as seen by post scores and/or verbalization of changes          Quality of Life Scores:  Scores of 19 and below usually indicate a poorer quality of life in these areas.  A difference of  2-3 points is a clinically meaningful difference.  A difference of 2-3 points in the total score of the Quality of Life Index has been associated with significant improvement in overall quality of life, self-image, physical symptoms, and general health in studies assessing change in quality of life.  PHQ-9: Review Flowsheet  More data exists      11/02/2024 10/06/2024 08/23/2024 05/18/2024 11/26/2023  Depression screen PHQ 2/9  Decreased Interest 2 1 1 1 1   Down, Depressed, Hopeless 1 2 2 2 2   PHQ - 2 Score 3 3 3 3 3   Altered sleeping 1 0 3 1 1   Tired, decreased energy 0 1 3 2 3   Change in appetite 2 2 3 3 1   Feeling bad or failure about yourself  1 3 3 3 3   Trouble concentrating 2 1 1 2 2   Moving slowly or fidgety/restless 1 1 0 1 3  Suicidal thoughts 0 1 0 0 0  PHQ-9 Score  10 12 16  15  16    Difficult doing work/chores Somewhat difficult - - - -    Details       Data saved with a  previous flowsheet row definition        Interpretation of Total Score  Total Score Depression Severity:  1-4 = Minimal depression, 5-9 = Mild depression, 10-14 = Moderate depression, 15-19 = Moderately severe depression, 20-27 = Severe depression   Psychosocial Evaluation and Intervention:  Psychosocial Evaluation - 11/02/24 1119       Psychosocial Evaluation & Interventions   Interventions Stress management education;Relaxation education;Encouraged to exercise with the program and follow exercise prescription    Comments Khylee is currently dealing with anxiety/depression. She is currently on psychotropic meds and feels they are helping. She wants to have a referral placed for a mental health specialist. Staff will provide Caliya with resources on how to reduce stress and will place a referral for mental health specialist.    Expected Outcomes For Yancy to have less depression and stress while participating in PR    Continue Psychosocial Services  Follow up required by staff          Psychosocial Re-Evaluation:  Psychosocial Re-Evaluation     Row Name 11/04/24 0933             Psychosocial Re-Evaluation   Current issues with Current Depression;History of Depression;Current Anxiety/Panic;Current Psychotropic Meds;Current Stress Concerns       Comments 30 day psy/soc re-eval as follows: Mckenleigh is scheduled to start the program on 11/08/24. No new psy/soc barriers or concerns since orientation.       Expected Outcomes For Clementine to have less depression and stress while participating in PR       Interventions Therapist referral;Stress management education;Encouraged to attend Pulmonary Rehabilitation for the exercise;Relaxation education       Continue Psychosocial Services  Follow up required by staff          Psychosocial Discharge (Final Psychosocial  Re-Evaluation):  Psychosocial Re-Evaluation - 11/04/24 0933       Psychosocial Re-Evaluation   Current issues with Current Depression;History of Depression;Current Anxiety/Panic;Current Psychotropic Meds;Current Stress Concerns    Comments 30 day psy/soc re-eval as follows: Senie is scheduled to start the program on 11/08/24. No new psy/soc barriers or concerns since orientation.    Expected Outcomes For Larrie to have less depression and stress while participating in PR    Interventions Therapist referral;Stress management education;Encouraged to attend Pulmonary Rehabilitation for the exercise;Relaxation education    Continue Psychosocial Services  Follow up required by staff          Education: Education Goals: Education classes will be provided on a weekly basis, covering required topics. Participant will state understanding/return demonstration of topics presented.  Learning Barriers/Preferences:  Learning Barriers/Preferences - 11/02/24 1049       Learning Barriers/Preferences   Learning Barriers Sight    Learning Preferences None          Education Topics: [employer] instruction that is supported by a PowerPoint presentation. Instructor discusses importance of knowing and understanding resting, exercise, and post-exercise oxygen saturation, heart rate, and blood pressure. Oxygen saturation, heart rate, blood pressure, rating of perceived exertion, and dyspnea are reviewed along with a normal range for these values.    Exercise for the Pulmonary Patient Group instruction that is supported by a PowerPoint presentation. Instructor discusses benefits of exercise, core components of exercise, frequency, duration, and intensity of an exercise routine, importance of utilizing pulse oximetry during exercise, safety while exercising, and options of places to exercise outside of rehab.    MET Level  Group instruction provided by PowerPoint, verbal discussion,  and  written material to support subject matter. Instructor reviews what METs are and how to increase METs.    Pulmonary Medications Verbally interactive group education provided by instructor with focus on inhaled medications and proper administration.   Anatomy and Physiology of the Respiratory System Group instruction provided by PowerPoint, verbal discussion, and written material to support subject matter. Instructor reviews respiratory cycle and anatomical components of the respiratory system and their functions. Instructor also reviews differences in obstructive and restrictive respiratory diseases with examples of each.    Oxygen Safety Group instruction provided by PowerPoint, verbal discussion, and written material to support subject matter. There is an overview of What is Oxygen and Why do we need it.  Instructor also reviews how to create a safe environment for oxygen use, the importance of using oxygen as prescribed, and the risks of noncompliance. There is a brief discussion on traveling with oxygen and resources the patient may utilize.   Oxygen Use Group instruction provided by PowerPoint, verbal discussion, and written material to discuss how supplemental oxygen is prescribed and different types of oxygen supply systems. Resources for more information are provided.    Breathing Techniques Group instruction that is supported by demonstration and informational handouts. Instructor discusses the benefits of pursed lip and diaphragmatic breathing and detailed demonstration on how to perform both.     Risk Factor Reduction Group instruction that is supported by a PowerPoint presentation. Instructor discusses the definition of a risk factor, different risk factors for pulmonary disease, and how the heart and lungs work together.   Pulmonary Diseases Group instruction provided by PowerPoint, verbal discussion, and written material to support subject matter. Instructor gives an  overview of the different type of pulmonary diseases. There is also a discussion on risk factors and symptoms as well as ways to manage the diseases.   Stress and Energy Conservation Group instruction provided by PowerPoint, verbal discussion, and written material to support subject matter. Instructor gives an overview of stress and the impact it can have on the body. Instructor also reviews ways to reduce stress. There is also a discussion on energy conservation and ways to conserve energy throughout the day.   Warning Signs and Symptoms Group instruction provided by PowerPoint, verbal discussion, and written material to support subject matter. Instructor reviews warning signs and symptoms of stroke, heart attack, cold and flu. Instructor also reviews ways to prevent the spread of infection.   Other Education Group or individual verbal, written, or video instructions that support the educational goals of the pulmonary rehab program.    Knowledge Questionnaire Score:  Knowledge Questionnaire Score - 11/02/24 1126       Knowledge Questionnaire Score   Pre Score 10/18          Core Components/Risk Factors/Patient Goals at Admission:  Personal Goals and Risk Factors at Admission - 11/02/24 1049       Core Components/Risk Factors/Patient Goals on Admission    Weight Management Yes;Weight Loss    Intervention Weight Management: Develop a combined nutrition and exercise program designed to reach desired caloric intake, while maintaining appropriate intake of nutrient and fiber, sodium and fats, and appropriate energy expenditure required for the weight goal.;Weight Management: Provide education and appropriate resources to help participant work on and attain dietary goals.;Weight Management/Obesity: Establish reasonable short term and long term weight goals.;Obesity: Provide education and appropriate resources to help participant work on and attain dietary goals.    Admit Weight 275 lb  12.7 oz (125.1 kg)  Expected Outcomes Short Term: Continue to assess and modify interventions until short term weight is achieved;Long Term: Adherence to nutrition and physical activity/exercise program aimed toward attainment of established weight goal;Weight Loss: Understanding of general recommendations for a balanced deficit meal plan, which promotes 1-2 lb weight loss per week and includes a negative energy balance of [id number] kcal/d;Understanding recommendations for meals to include 15-35% energy as protein, 25-35% energy from fat, 35-60% energy from carbohydrates, less than 200mg  of dietary cholesterol, 20-35 gm of total fiber daily;Understanding of distribution of calorie intake throughout the day with the consumption of 4-5 meals/snacks    Improve shortness of breath with ADL's Yes    Intervention Provide education, individualized exercise plan and daily activity instruction to help decrease symptoms of SOB with activities of daily living.    Expected Outcomes Short Term: Improve cardiorespiratory fitness to achieve a reduction of symptoms when performing ADLs;Long Term: Be able to perform more ADLs without symptoms or delay the onset of symptoms    Stress Yes    Intervention Offer individual and/or small group education and counseling on adjustment to heart disease, stress management and health-related lifestyle change. Teach and support self-help strategies.;Refer participants experiencing significant psychosocial distress to appropriate mental health specialists for further evaluation and treatment. When possible, include family members and significant others in education/counseling sessions.    Expected Outcomes Short Term: Participant demonstrates changes in health-related behavior, relaxation and other stress management skills, ability to obtain effective social support, and compliance with psychotropic medications if prescribed.;Long Term: Emotional wellbeing is indicated by absence of  clinically significant psychosocial distress or social isolation.          Core Components/Risk Factors/Patient Goals Review:   Goals and Risk Factor Review     Row Name 11/04/24 0936             Core Components/Risk Factors/Patient Goals Review   Personal Goals Review Weight Management/Obesity;Improve shortness of breath with ADL's;Develop more efficient breathing techniques such as purse lipped breathing and diaphragmatic breathing and practicing self-pacing with activity.;Stress       Review Monthly review of patients Core Components/Risk Factors/Patient Goals are as follows: Nicci is scheduled to start PR on 11/08/24. Unable to asses goals at this time.       Expected Outcomes To improve shortness of breath with ADL's, lose weight, reduce stress and develop more efficient breathing techniques such as purse lipped breathing and diaphragmatic breathing; and practicing self-pacing with activity.          Core Components/Risk Factors/Patient Goals at Discharge (Final Review):   Goals and Risk Factor Review - 11/04/24 0936       Core Components/Risk Factors/Patient Goals Review   Personal Goals Review Weight Management/Obesity;Improve shortness of breath with ADL's;Develop more efficient breathing techniques such as purse lipped breathing and diaphragmatic breathing and practicing self-pacing with activity.;Stress    Review Monthly review of patients Core Components/Risk Factors/Patient Goals are as follows: Arelly is scheduled to start PR on 11/08/24. Unable to asses goals at this time.    Expected Outcomes To improve shortness of breath with ADL's, lose weight, reduce stress and develop more efficient breathing techniques such as purse lipped breathing and diaphragmatic breathing; and practicing self-pacing with activity.          ITP Comments:Pt is making expected progress toward Pulmonary Rehab goals after completing 1 session(s). Recommend continued exercise, life style  modification, education, and utilization of breathing techniques to increase stamina and strength, while also decreasing shortness  of breath with exertion.  Dr. Slater Staff is Medical Director for Pulmonary Rehab at [facility].          [1]  Current Outpatient Medications:    albuterol  (PROAIR  HFA) 108 (90 Base) MCG/ACT inhaler, Inhale 1-2 puffs into the lungs every 6 (six) hours as needed for wheezing or shortness of breath., Disp: 18 g, Rfl: 1   ALPRAZolam  (XANAX ) 0.5 MG tablet, Take 1 tablet (0.5 mg total) by mouth at bedtime as needed for anxiety (sleep study to be used in lab). (Patient not taking: Reported on 11/02/2024), Disp: 2 tablet, Rfl: 0   busPIRone  (BUSPAR ) 10 MG tablet, Take 1 tablet (10 mg total) by mouth 2 (two) times daily. Patient needs to make appointment to be seen by PCP., Disp: 60 tablet, Rfl: 0   guaiFENesin  (MUCINEX ) 600 MG 12 hr tablet, Take 1 tablet (600 mg total) by mouth 2 (two) times daily., Disp: 60 tablet, Rfl: 0   Ibuprofen 200 MG CAPS, Take 600 mg by mouth daily as needed (for pain)., Disp: , Rfl:    Lactobacillus (ACIDOPHILUS) CAPS capsule, Take 2 capsules by mouth 3 (three) times daily., Disp: 180 capsule, Rfl: 0   lidocaine  (LIDODERM ) 5 %, Place 1 patch onto the skin daily. Remove & Discard patch within 12 hours or as directed by MD, Disp: , Rfl:    Nystatin  (GERHARDT'S BUTT CREAM) CREA, Apply 1 Application topically daily., Disp: , Rfl:    sertraline  (ZOLOFT ) 100 MG tablet, Take 1 tablet (100 mg total) by mouth daily., Disp: 90 tablet, Rfl: 3   umeclidinium-vilanterol (ANORO ELLIPTA ) 62.5-25 MCG/ACT AEPB, Inhale 1 puff into the lungs daily., Disp: 60 each, Rfl: 0 [2]  Social History Tobacco Use  Smoking Status Former   Current packs/day: 0.00   Average packs/day: 0.1 packs/day for 47.9 years (4.8 ttl pk-yrs)   Types: Cigarettes   Start date: 10/27/1976   Quit date: 09/21/2024   Years since quitting: 0.1  Smokeless Tobacco Never

## 2024-11-17 ENCOUNTER — Encounter (HOSPITAL_COMMUNITY)

## 2024-11-21 ENCOUNTER — Telehealth (HOSPITAL_COMMUNITY): Payer: Self-pay | Admitting: *Deleted

## 2024-11-21 NOTE — Telephone Encounter (Signed)
 LVM that pt's normal 8:15 class will not operate tomorrow due to road conditions. If she feels comfortable driving, she can attend the 10:15 or 1:15 class.  Aliene Aris BS, ACSM-CEP 11/21/2024 2:06 PM

## 2024-11-22 ENCOUNTER — Encounter (HOSPITAL_COMMUNITY)

## 2024-11-22 ENCOUNTER — Telehealth (HOSPITAL_COMMUNITY): Payer: Self-pay | Admitting: *Deleted

## 2024-11-22 NOTE — Telephone Encounter (Signed)
 [facility] [place]: [id number] (exp. 11/04/24 to 01/28/25)  Patient is scheduled at [facility] for 12/13/24 at 8 pm.  Mailed packet and sent mychart

## 2024-11-22 NOTE — Telephone Encounter (Signed)
 Pt LVM asking about bringing her POC to class Thursday 1/29. She also voiced that she will not make to class today.  Returned her call and LVM stating she can bring it to Pulmonary Rehab class.  Aliene Aris BS, ACSM-CEP 11/22/2024 11:12 AM

## 2024-11-23 ENCOUNTER — Telehealth (HOSPITAL_COMMUNITY): Payer: Self-pay

## 2024-11-23 NOTE — Telephone Encounter (Signed)
 Called pt to let her know that the elevators are broke at this time. Encouraged pt to park on second floor and take the stairs if she feels comfortable to do that. She states she will try to make it in.

## 2024-11-24 ENCOUNTER — Encounter (HOSPITAL_COMMUNITY): Admission: RE | Admit: 2024-11-24 | Source: Ambulatory Visit

## 2024-11-28 ENCOUNTER — Telehealth (HOSPITAL_COMMUNITY): Payer: Self-pay

## 2024-11-28 NOTE — Telephone Encounter (Signed)
 Called pt about inclement weather. LVM with call back number.

## 2024-11-29 ENCOUNTER — Encounter (HOSPITAL_COMMUNITY): Admission: RE | Admit: 2024-11-29 | Source: Ambulatory Visit

## 2024-11-29 VITALS — Wt 279.5 lb

## 2024-11-30 ENCOUNTER — Telehealth (HOSPITAL_COMMUNITY): Payer: Self-pay

## 2024-11-30 NOTE — Telephone Encounter (Signed)
 Called to check on pt. LVM with call back number.

## 2024-12-01 ENCOUNTER — Encounter (HOSPITAL_COMMUNITY)

## 2024-12-02 ENCOUNTER — Telehealth (HOSPITAL_COMMUNITY): Payer: Self-pay

## 2024-12-02 NOTE — Telephone Encounter (Signed)
 LVM for pt about missing Pulm Rehab for the past 2 weeks.

## 2024-12-06 ENCOUNTER — Encounter (HOSPITAL_COMMUNITY)

## 2024-12-08 ENCOUNTER — Encounter (HOSPITAL_COMMUNITY)

## 2024-12-13 ENCOUNTER — Encounter

## 2024-12-13 ENCOUNTER — Encounter (HOSPITAL_COMMUNITY)

## 2024-12-13 ENCOUNTER — Ambulatory Visit: Admitting: Licensed Clinical Social Worker

## 2024-12-15 ENCOUNTER — Encounter (HOSPITAL_COMMUNITY)

## 2024-12-20 ENCOUNTER — Encounter (HOSPITAL_COMMUNITY)

## 2024-12-22 ENCOUNTER — Encounter (HOSPITAL_COMMUNITY)

## 2024-12-27 ENCOUNTER — Encounter (HOSPITAL_COMMUNITY)

## 2024-12-29 ENCOUNTER — Encounter (HOSPITAL_COMMUNITY)

## 2025-01-03 ENCOUNTER — Encounter (HOSPITAL_COMMUNITY)

## 2025-01-05 ENCOUNTER — Encounter (HOSPITAL_COMMUNITY)

## 2025-01-10 ENCOUNTER — Encounter (HOSPITAL_COMMUNITY)

## 2025-01-12 ENCOUNTER — Encounter (HOSPITAL_COMMUNITY)

## 2025-01-17 ENCOUNTER — Encounter (HOSPITAL_COMMUNITY)

## 2025-01-19 ENCOUNTER — Encounter (HOSPITAL_COMMUNITY)

## 2025-01-24 ENCOUNTER — Encounter (HOSPITAL_COMMUNITY)

## 2025-01-26 ENCOUNTER — Encounter (HOSPITAL_COMMUNITY)
